# Patient Record
Sex: Female | Born: 1935 | Race: White | Hispanic: No | Smoking: Former smoker
Health system: Southern US, Community
[De-identification: ages and names within clinical notes are randomized; demographics above are authoritative.]

## PROBLEM LIST (undated history)

## (undated) DIAGNOSIS — F32A Depression, unspecified: Secondary | ICD-10-CM

## (undated) DIAGNOSIS — I10 Essential (primary) hypertension: Secondary | ICD-10-CM

## (undated) DIAGNOSIS — E785 Hyperlipidemia, unspecified: Secondary | ICD-10-CM

## (undated) DIAGNOSIS — I2510 Atherosclerotic heart disease of native coronary artery without angina pectoris: Secondary | ICD-10-CM

## (undated) DIAGNOSIS — Z8711 Personal history of peptic ulcer disease: Secondary | ICD-10-CM

## (undated) DIAGNOSIS — M545 Low back pain, unspecified: Secondary | ICD-10-CM

## (undated) DIAGNOSIS — G8929 Other chronic pain: Secondary | ICD-10-CM

## (undated) DIAGNOSIS — J189 Pneumonia, unspecified organism: Secondary | ICD-10-CM

## (undated) DIAGNOSIS — K219 Gastro-esophageal reflux disease without esophagitis: Secondary | ICD-10-CM

## (undated) DIAGNOSIS — G43909 Migraine, unspecified, not intractable, without status migrainosus: Secondary | ICD-10-CM

## (undated) DIAGNOSIS — I219 Acute myocardial infarction, unspecified: Secondary | ICD-10-CM

## (undated) DIAGNOSIS — F419 Anxiety disorder, unspecified: Secondary | ICD-10-CM

## (undated) DIAGNOSIS — I714 Abdominal aortic aneurysm, without rupture, unspecified: Secondary | ICD-10-CM

## (undated) DIAGNOSIS — R0600 Dyspnea, unspecified: Secondary | ICD-10-CM

## (undated) DIAGNOSIS — Z8719 Personal history of other diseases of the digestive system: Secondary | ICD-10-CM

## (undated) DIAGNOSIS — F329 Major depressive disorder, single episode, unspecified: Secondary | ICD-10-CM

## (undated) DIAGNOSIS — Z9861 Coronary angioplasty status: Principal | ICD-10-CM

## (undated) DIAGNOSIS — Z87442 Personal history of urinary calculi: Secondary | ICD-10-CM

## (undated) DIAGNOSIS — E039 Hypothyroidism, unspecified: Secondary | ICD-10-CM

## (undated) DIAGNOSIS — H3530 Unspecified macular degeneration: Secondary | ICD-10-CM

## (undated) DIAGNOSIS — R519 Headache, unspecified: Secondary | ICD-10-CM

## (undated) DIAGNOSIS — Z9889 Other specified postprocedural states: Secondary | ICD-10-CM

## (undated) DIAGNOSIS — R112 Nausea with vomiting, unspecified: Secondary | ICD-10-CM

## (undated) DIAGNOSIS — R51 Headache: Secondary | ICD-10-CM

## (undated) DIAGNOSIS — R0602 Shortness of breath: Secondary | ICD-10-CM

## (undated) DIAGNOSIS — I739 Peripheral vascular disease, unspecified: Secondary | ICD-10-CM

## (undated) HISTORY — DX: Abdominal aortic aneurysm, without rupture: I71.4

## (undated) HISTORY — PX: CATARACT EXTRACTION W/ INTRAOCULAR LENS  IMPLANT, BILATERAL: SHX1307

## (undated) HISTORY — DX: Unspecified macular degeneration: H35.30

## (undated) HISTORY — DX: Atherosclerotic heart disease of native coronary artery without angina pectoris: I25.10

## (undated) HISTORY — DX: Hypothyroidism, unspecified: E03.9

## (undated) HISTORY — PX: APPENDECTOMY: SHX54

## (undated) HISTORY — DX: Coronary angioplasty status: Z98.61

## (undated) HISTORY — PX: CHOLECYSTECTOMY: SHX55

## (undated) HISTORY — DX: Abdominal aortic aneurysm, without rupture, unspecified: I71.40

## (undated) HISTORY — PX: DILATION AND CURETTAGE OF UTERUS: SHX78

## (undated) HISTORY — PX: NM MYOCAR PERF WALL MOTION: HXRAD629

## (undated) HISTORY — DX: Essential (primary) hypertension: I10

## (undated) HISTORY — DX: Peripheral vascular disease, unspecified: I73.9

## (undated) HISTORY — DX: Hyperlipidemia, unspecified: E78.5

---

## 1968-12-18 DIAGNOSIS — J189 Pneumonia, unspecified organism: Secondary | ICD-10-CM

## 1968-12-18 HISTORY — DX: Pneumonia, unspecified organism: J18.9

## 1998-02-27 ENCOUNTER — Inpatient Hospital Stay (HOSPITAL_COMMUNITY): Admission: RE | Admit: 1998-02-27 | Discharge: 1998-02-28 | Payer: Self-pay | Admitting: Urology

## 1999-06-05 ENCOUNTER — Encounter: Payer: Self-pay | Admitting: Family Medicine

## 1999-06-05 ENCOUNTER — Encounter: Admission: RE | Admit: 1999-06-05 | Discharge: 1999-06-05 | Payer: Self-pay | Admitting: Family Medicine

## 1999-06-30 ENCOUNTER — Ambulatory Visit: Admission: RE | Admit: 1999-06-30 | Discharge: 1999-06-30 | Payer: Self-pay | Admitting: Pulmonary Disease

## 1999-09-07 ENCOUNTER — Other Ambulatory Visit: Admission: RE | Admit: 1999-09-07 | Discharge: 1999-09-07 | Payer: Self-pay | Admitting: Family Medicine

## 1999-11-11 ENCOUNTER — Encounter: Payer: Self-pay | Admitting: Family Medicine

## 1999-11-11 ENCOUNTER — Encounter: Admission: RE | Admit: 1999-11-11 | Discharge: 1999-11-11 | Payer: Self-pay | Admitting: Family Medicine

## 2000-03-18 ENCOUNTER — Encounter: Payer: Self-pay | Admitting: Family Medicine

## 2000-03-18 ENCOUNTER — Encounter: Admission: RE | Admit: 2000-03-18 | Discharge: 2000-03-18 | Payer: Self-pay | Admitting: Family Medicine

## 2000-05-04 ENCOUNTER — Ambulatory Visit (HOSPITAL_COMMUNITY): Admission: RE | Admit: 2000-05-04 | Discharge: 2000-05-04 | Payer: Self-pay | Admitting: Gastroenterology

## 2000-05-23 ENCOUNTER — Observation Stay (HOSPITAL_COMMUNITY): Admission: RE | Admit: 2000-05-23 | Discharge: 2000-05-23 | Payer: Self-pay | Admitting: General Surgery

## 2000-05-23 ENCOUNTER — Encounter: Payer: Self-pay | Admitting: General Surgery

## 2000-05-23 ENCOUNTER — Encounter (INDEPENDENT_AMBULATORY_CARE_PROVIDER_SITE_OTHER): Payer: Self-pay

## 2000-05-30 ENCOUNTER — Ambulatory Visit (HOSPITAL_COMMUNITY): Admission: RE | Admit: 2000-05-30 | Discharge: 2000-05-30 | Payer: Self-pay | Admitting: General Surgery

## 2000-05-30 ENCOUNTER — Encounter: Payer: Self-pay | Admitting: General Surgery

## 2000-11-25 ENCOUNTER — Encounter: Payer: Self-pay | Admitting: Family Medicine

## 2000-11-25 ENCOUNTER — Encounter: Admission: RE | Admit: 2000-11-25 | Discharge: 2000-11-25 | Payer: Self-pay | Admitting: Family Medicine

## 2001-04-19 DIAGNOSIS — I219 Acute myocardial infarction, unspecified: Secondary | ICD-10-CM

## 2001-04-19 HISTORY — DX: Acute myocardial infarction, unspecified: I21.9

## 2002-03-06 ENCOUNTER — Other Ambulatory Visit: Admission: RE | Admit: 2002-03-06 | Discharge: 2002-03-06 | Payer: Self-pay | Admitting: Family Medicine

## 2002-04-16 ENCOUNTER — Encounter: Payer: Self-pay | Admitting: Cardiology

## 2002-04-16 ENCOUNTER — Encounter: Admission: RE | Admit: 2002-04-16 | Discharge: 2002-04-16 | Payer: Self-pay | Admitting: Cardiology

## 2002-04-19 HISTORY — PX: CORONARY ANGIOPLASTY WITH STENT PLACEMENT: SHX49

## 2002-04-20 ENCOUNTER — Ambulatory Visit (HOSPITAL_COMMUNITY): Admission: RE | Admit: 2002-04-20 | Discharge: 2002-04-21 | Payer: Self-pay | Admitting: Cardiology

## 2003-01-30 ENCOUNTER — Encounter: Admission: RE | Admit: 2003-01-30 | Discharge: 2003-01-30 | Payer: Self-pay | Admitting: Family Medicine

## 2003-01-30 ENCOUNTER — Encounter: Payer: Self-pay | Admitting: Family Medicine

## 2004-01-22 ENCOUNTER — Ambulatory Visit: Payer: Self-pay | Admitting: Ophthalmology

## 2004-02-26 ENCOUNTER — Ambulatory Visit: Payer: Self-pay | Admitting: Ophthalmology

## 2004-06-24 ENCOUNTER — Other Ambulatory Visit: Admission: RE | Admit: 2004-06-24 | Discharge: 2004-06-24 | Payer: Self-pay | Admitting: Family Medicine

## 2004-06-25 ENCOUNTER — Encounter: Admission: RE | Admit: 2004-06-25 | Discharge: 2004-06-25 | Payer: Self-pay | Admitting: Family Medicine

## 2004-07-18 HISTORY — PX: CORONARY ANGIOPLASTY: SHX604

## 2004-08-03 ENCOUNTER — Encounter: Admission: RE | Admit: 2004-08-03 | Discharge: 2004-08-03 | Payer: Self-pay | Admitting: Cardiology

## 2004-08-06 ENCOUNTER — Ambulatory Visit (HOSPITAL_COMMUNITY): Admission: RE | Admit: 2004-08-06 | Discharge: 2004-08-07 | Payer: Self-pay | Admitting: Cardiology

## 2004-09-04 ENCOUNTER — Emergency Department (HOSPITAL_COMMUNITY): Admission: EM | Admit: 2004-09-04 | Discharge: 2004-09-04 | Payer: Self-pay | Admitting: Emergency Medicine

## 2005-08-09 ENCOUNTER — Ambulatory Visit (HOSPITAL_COMMUNITY): Admission: RE | Admit: 2005-08-09 | Discharge: 2005-08-09 | Payer: Self-pay | Admitting: Gastroenterology

## 2005-08-09 ENCOUNTER — Encounter (INDEPENDENT_AMBULATORY_CARE_PROVIDER_SITE_OTHER): Payer: Self-pay | Admitting: Specialist

## 2005-11-18 ENCOUNTER — Ambulatory Visit (HOSPITAL_COMMUNITY): Admission: RE | Admit: 2005-11-18 | Discharge: 2005-11-18 | Payer: Self-pay | Admitting: *Deleted

## 2005-12-16 ENCOUNTER — Ambulatory Visit: Payer: Self-pay | Admitting: Endocrinology

## 2005-12-18 DIAGNOSIS — I739 Peripheral vascular disease, unspecified: Secondary | ICD-10-CM

## 2005-12-18 HISTORY — DX: Peripheral vascular disease, unspecified: I73.9

## 2005-12-29 ENCOUNTER — Observation Stay (HOSPITAL_COMMUNITY): Admission: RE | Admit: 2005-12-29 | Discharge: 2005-12-30 | Payer: Self-pay | Admitting: Cardiology

## 2005-12-30 HISTORY — PX: ILIAC ARTERY STENT: SHX1786

## 2006-03-03 ENCOUNTER — Encounter: Admission: RE | Admit: 2006-03-03 | Discharge: 2006-03-03 | Payer: Self-pay | Admitting: Family Medicine

## 2006-05-19 ENCOUNTER — Inpatient Hospital Stay (HOSPITAL_COMMUNITY): Admission: EM | Admit: 2006-05-19 | Discharge: 2006-05-20 | Payer: Self-pay | Admitting: Emergency Medicine

## 2007-01-16 ENCOUNTER — Encounter: Payer: Self-pay | Admitting: *Deleted

## 2007-01-16 DIAGNOSIS — E785 Hyperlipidemia, unspecified: Secondary | ICD-10-CM | POA: Insufficient documentation

## 2007-01-16 DIAGNOSIS — N951 Menopausal and female climacteric states: Secondary | ICD-10-CM | POA: Insufficient documentation

## 2007-01-16 DIAGNOSIS — E039 Hypothyroidism, unspecified: Secondary | ICD-10-CM

## 2007-01-16 DIAGNOSIS — I10 Essential (primary) hypertension: Secondary | ICD-10-CM

## 2007-01-16 DIAGNOSIS — I5032 Chronic diastolic (congestive) heart failure: Secondary | ICD-10-CM

## 2007-01-16 DIAGNOSIS — F411 Generalized anxiety disorder: Secondary | ICD-10-CM | POA: Insufficient documentation

## 2008-08-27 ENCOUNTER — Encounter: Payer: Self-pay | Admitting: Pulmonary Disease

## 2008-08-27 ENCOUNTER — Encounter: Admission: RE | Admit: 2008-08-27 | Discharge: 2008-08-27 | Payer: Self-pay | Admitting: Family Medicine

## 2008-09-10 ENCOUNTER — Ambulatory Visit: Payer: Self-pay | Admitting: Pulmonary Disease

## 2008-09-10 DIAGNOSIS — J984 Other disorders of lung: Secondary | ICD-10-CM

## 2008-11-16 ENCOUNTER — Encounter: Admission: RE | Admit: 2008-11-16 | Discharge: 2008-11-16 | Payer: Self-pay | Admitting: Family Medicine

## 2008-11-17 HISTORY — PX: CARDIAC CATHETERIZATION: SHX172

## 2008-11-26 ENCOUNTER — Inpatient Hospital Stay (HOSPITAL_COMMUNITY): Admission: EM | Admit: 2008-11-26 | Discharge: 2008-11-27 | Payer: Self-pay | Admitting: Emergency Medicine

## 2010-07-25 LAB — CARDIAC PANEL(CRET KIN+CKTOT+MB+TROPI)
CK, MB: 1 ng/mL (ref 0.3–4.0)
Relative Index: INVALID (ref 0.0–2.5)
Total CK: 42 U/L (ref 7–177)
Troponin I: 0.01 ng/mL (ref 0.00–0.06)

## 2010-07-25 LAB — DIFFERENTIAL
Basophils Absolute: 0 10*3/uL (ref 0.0–0.1)
Eosinophils Absolute: 0.2 10*3/uL (ref 0.0–0.7)
Lymphocytes Relative: 33 % (ref 12–46)
Lymphs Abs: 2 10*3/uL (ref 0.7–4.0)
Monocytes Absolute: 0.5 10*3/uL (ref 0.1–1.0)

## 2010-07-25 LAB — HEMOGLOBIN A1C
Hgb A1c MFr Bld: 5.9 % (ref 4.6–6.1)
Mean Plasma Glucose: 123 mg/dL

## 2010-07-25 LAB — COMPREHENSIVE METABOLIC PANEL
AST: 30 U/L (ref 0–37)
CO2: 28 mEq/L (ref 19–32)
Chloride: 104 mEq/L (ref 96–112)
Creatinine, Ser: 0.64 mg/dL (ref 0.4–1.2)
GFR calc Af Amer: >60 mL/min (ref >60)
Sodium: 139 mEq/L (ref 135–145)
Total Bilirubin: 0.8 mg/dL (ref 0.3–1.2)

## 2010-07-25 LAB — LIPID PANEL
Cholesterol: 117 mg/dL (ref 0–200)
HDL: 29 mg/dL — ABNORMAL LOW (ref >39)
Total CHOL/HDL Ratio: 4 RATIO
Triglycerides: 198 mg/dL — ABNORMAL HIGH (ref <150)
VLDL: 40 mg/dL (ref 0–40)

## 2010-07-25 LAB — BASIC METABOLIC PANEL
BUN: 11 mg/dL (ref 6–23)
Calcium: 8.9 mg/dL (ref 8.4–10.5)
Creatinine, Ser: 0.63 mg/dL (ref 0.4–1.2)
GFR calc Af Amer: >60 mL/min (ref >60)
GFR calc non Af Amer: >60 mL/min (ref >60)
Glucose, Bld: 98 mg/dL (ref 70–99)
Potassium: 3.6 mEq/L (ref 3.5–5.1)
Sodium: 137 mEq/L (ref 135–145)

## 2010-07-25 LAB — CK TOTAL AND CKMB (NOT AT ARMC)
CK, MB: 1.1 ng/mL (ref 0.3–4.0)
Relative Index: INVALID (ref 0.0–2.5)
Total CK: 41 U/L (ref 7–177)

## 2010-07-25 LAB — CBC
MCV: 101.4 fL — ABNORMAL HIGH (ref 78.0–100.0)
Platelets: 132 10*3/uL — ABNORMAL LOW (ref 150–400)
RDW: 12.9 % (ref 11.5–15.5)

## 2010-07-25 LAB — POCT CARDIAC MARKERS
CKMB, poc: <1 ng/mL — ABNORMAL LOW (ref 1.0–8.0)
Myoglobin, poc: 71 ng/mL (ref 12–200)

## 2010-07-25 LAB — APTT: aPTT: 28 seconds (ref 24–37)

## 2010-07-25 LAB — POCT I-STAT, CHEM 8
BUN: 19 mg/dL (ref 6–23)
Chloride: 107 mEq/L (ref 96–112)
HCT: 38 % (ref 36.0–46.0)
Sodium: 141 mEq/L (ref 135–145)
TCO2: 23 mmol/L (ref 0–100)

## 2010-07-25 LAB — PROTIME-INR: Prothrombin Time: 12.7 seconds (ref 11.6–15.2)

## 2010-07-25 LAB — TSH: TSH: 0.997 u[IU]/mL (ref 0.350–4.500)

## 2010-07-25 LAB — TROPONIN I: Troponin I: 0.01 ng/mL (ref 0.00–0.06)

## 2010-09-01 NOTE — Discharge Summary (Signed)
NAMELUVERNA, [name] NO.:  [id number]   MEDICAL RECORD NO.:  [id number]          PATIENT TYPE:  INP   LOCATION:  3731                         FACILITY:  [facility]   PHYSICIAN:  [name], M.D. DATE OF BIRTH:  04-04-1930   DATE OF ADMISSION:  11/26/2008  DATE OF DISCHARGE:  11/27/2008                               DISCHARGE SUMMARY   DISCHARGE DIAGNOSES:  1. Unstable angina, catheterization this admission showing severe      right coronary artery disease with subtotal occlusion with left-to-      right collaterals, to be treated medically.  2. Known peripheral vascular disease with prior left common iliac      artery percutaneous transluminal angioplasty.  3. Treated dyslipidemia.  4. Treated hypertension.  5. Recent otitis media.   HOSPITAL COURSE:  The patient is a 77 year old female followed by Dr.  Clarene Duke in the past and Dr. Larina Bras.  She was previously followed by Dr.  Smith Mince.  Dr. Clarene Duke had not seen her since 2008.  She had a  catheterization in 2008 showing good LV function.  She had prior stents  to RCA which in 2008 were restenosed.  She apparently was treated  medically.  We had not seen her since.  She had stopped her Plavix,  Ranexa in the interim.  She presented on November 26, 2008, with chest  pain, which awakened her from sleep.  She was admitted to telemetry,  started on IV heparin, nitro paste, and beta-blocker.  Plavix was not  started in case she needed bypass.  Catheterization was done on November 26, 2008, by Dr. Mariah Milling.  She has severely diseased RCA with left-to-  right collaterals.  Circumflex had a 60% mid stenosis.  LAD was without  significant stenosis.  Plan is for continued medical therapy.  Dr.  Clarene Duke feels she can be discharged on November 27, 2008.   DISCHARGE MEDICATIONS:  1. Aspirin 81 mg a day.  2. Zocor 80 mg a day.  3. Lopressor 12.5 mg b.i.d.  4. Mavik 8 mg a day.  5. Imdur 30 mg a day.  6. Nitroglycerin sublingual  p.r.n.  7. Amoxicillin 500 mg t.i.d. as previously taken.   She will follow up with Dr. Clarene Duke as an outpatient.   LABORATORY DATA:  Sodium 137, potassium 3.6, BUN 11, creatinine 0.63.  White count 6.4, hemoglobin 12.2, hematocrit 35.4, platelets 132.  Troponins were negative.  LDL was 48, HDL 29, cholesterol 117.  Liver  functions  were normal.  Troponins were negative as noted.  TSH 0.97.  BNP is less  than 30, hemoglobin A1c is 5.9, INR is 1.0.  EKG; sinus rhythm without  acute changes.   DISPOSITION:  The patient discharged in stable condition and follow up  with Dr. Clarene Duke.      Abelino Derrick, P.A.    ______________________________  [name], M.D.    Lenard Lance  D:  11/27/2008  T:  11/27/2008  Job:  [id number]   cc:   Ace Gins, MD

## 2010-09-01 NOTE — Consult Note (Signed)
NAMEANTARA, BRECHEISEN NO.:  [id number]   MEDICAL RECORD NO.:  [id number]          PATIENT TYPE:  INP   LOCATION:  3731                         FACILITY:  [facility]   PHYSICIAN:  Antonieta Iba, MD   DATE OF BIRTH:  December 11, 1929   DATE OF CONSULTATION:  11/26/2008  DATE OF DISCHARGE:                                 CONSULTATION   Cardiac Catheterization   IDENTIFICATION:  [name] is a pleasant 77 year old woman, who is  seen by Dr. Julieanne Manson and Dr. Ace Gins, who has an underlying  history of coronary artery disease with numerous stents placed to her  RCA, who presents with chest pain.  She was last catheterized in  May 20, 2006, where she was shown to have severe proximal to mid RCA  disease and moderate mid left circumflex disease.  She presents to the  catheterization lab for further evaluation given her chest pain.   PROCEDURE DETAILS:  Risks and benefits of the procedure were discussed  with Ms. Scallan, and consent was obtained.  She was brought to the  cardiac catheterization lab, and prepped and draped in the usual sterile  fashion.  The modified Seldinger technique was used to engage the right  femoral artery.  A 5-French introducer sheath was placed, and 5-French  JL4 and a JR4 were used to engage the left main and the RCA ostium  respectively.  Hand injection contrast with cinematography record the  coronary anatomy.  A 5-French pigtail catheter was used to across the  aortic valve, and an LV gram was recorded.  At the end of the case, an  ACT was obtained.  The sheath was removed, manual pressure held, and  hemostasis obtained.  No complications were reported at the time of this  dictation.   CORONARY ANATOMY:  Left main; left main is a moderate-to-large sized  vessel that bifurcates into the LAD and the left circumflex.  There is  no significant disease noted.   Left anterior descending; the LAD is a moderate-to-large sized  vessel  that extends distally to the apex.  There is 1 moderate-sized diagonal  branch.  There is small mid to distal 20-30% lesion.  Otherwise, no  significant stenosis noted.   Left circumflex; left circumflex is a moderate-to-large sized vessel.  There is 60% proximal to mid disease noted at a tortuous bend.  There is  also 50% lesion noted at the branch point of a circumflex vessel.  Otherwise, no significant disease noted.   Right coronary artery; right coronary artery is diffusely diseased,  subtotally occluded in the proximal and mid RCA stent.  There is TIMI 1  flow distally.  The mid to distal vessel is visible and fills via  collaterals from the left system.   LV gram shows normal systolic function with minimal hypokinesis of the  inferior wall, estimated ejection fraction of 55%.  No significant  mitral regurgitation or aortic stenosis noted.   In summary, severe subtotal occlusion of the RCA with TIMI 1 flow  distally.  There is collateral flow from the  left to right noted.  There  is also moderate disease of the left circumflex, which is unchanged from  previous catheterization in February 2008.  There is minimal LAD disease  in the mid to distal region.  The case was discussed  with Dr. Allyson Sabal, and no PCI was planned on the RCA given the previous  PCI, the small nature of the vessel, and the fact that there are  collaterals from left to right, and she has minimal symptoms.  The case  will be discussed with Dr. Julieanne Manson as well.      Antonieta Iba, MD  Electronically Signed     TJG/MEDQ  D:  11/26/2008  T:  11/27/2008  Job:  [id number]

## 2010-09-04 NOTE — Cardiovascular Report (Signed)
NAMESHERRITA, RIEDERER NO.:  [id number]   MEDICAL RECORD NO.:  [id number]          PATIENT TYPE:  OIB   LOCATION:  2899                         FACILITY:  [facility]   PHYSICIAN:  Darlin Priestly, MD  DATE OF BIRTH:  09/24/1929   DATE OF PROCEDURE:  11/18/2005  DATE OF DISCHARGE:                              CARDIAC CATHETERIZATION   PROCEDURE:  1. Left heart catheterization.  2. Coronary angiography.  3. Left ventriculogram.  4. Abdominal aortogram.   COMPLICATIONS:  None.   INDICATIONS:  Mr. Buresh is a 77 year old female patient of Dr. Caprice Kluver  and Dr. Smith Mince with a history of CAD status post repeat percutaneous  intervention of the proximal and mid RCA with recurrent in-stent restenosis,  hyperlipidemia, hypothyroidism, history of PVD with a recent complaint of  increasing shortness of breath and fatigue.  She is now referred for repeat  catheterization to rule out progression of her CAD.   DESCRIPTION OF PROCEDURE:  After giving informed consent, the patient was  brought to the cardiac cath lab where her right and left groin was shaved,  prepped and draped in the usual sterile fashion.  ECG monitoring  established.  Using modified Seldinger technique, a 6-French arterial sheath  was inserted in the right femoral artery.  A 6-French diagnostic catheter  was used to perform diagnostic angiography.   The left main is a medium to large size vessel with no significant disease.   LAD is a medium size vessel which courses to the apex and give rise to two  diagonal branches.  The LAD is noted to have diffuse 40% irregularities.   The first and second diagonals are small to medium size vessels with no  significant disease.   The left circumflex is a medium size vessel which courses through the AV  groove and gives rise to one obtuse marginal branch.  The AV groove  circumflex is noted to have 60% mid vessel stenosis.   The first OM is a medium size  vessel which bifurcates in the mid segment.  There is 60% narrowing in the upper branch of the bifurcation.   There are left and right collaterals to the distal PDA and posterolateral  branch.   The right coronary artery is a small vessel with overlapping stents noted in  its mid and proximal segment.  There is diffuse in-stent restenosis up to  70% in the proximal stent and diffuse 99% through the mid stent.  The  remainder of the RCA is a small vessel.  As previously stated, there are  left and right collaterals of the distal RCA.   Left ventriculogram has a low normal EF of 50% with mild apical hypokinesis.   Abdominal aortogram reveals two moderate size infrarenal aneurysms present.  The iliacs appear to be moderately calcified with a 50% proximal lesion in  the right iliac and a 70% lesion in the early to mid portion of the left  iliac.   HEMODYNAMICS:  Systemic arterial pressure 136/85, LV __________ pressure  136/80.  LVEDP of 12.   CONCLUSION:  1.  Significant one-vessel coronary artery disease involving a small right      coronary artery with diffuse in-stent restenosis.  2. Low normal ejection fraction with wall motion abnormalities as noted      above.  3. Moderate size infrarenal aneurysm.  4. Significant left iliac disease.  5. Systemic hypertension.      Darlin Priestly, MD  Electronically Signed     RHM/MEDQ  D:  11/18/2005  T:  11/18/2005  Job:  [id number]   cc:   [name], M.D.  [name], M.D.

## 2010-09-04 NOTE — Consult Note (Signed)
[facility]                            ENDOCRINOLOGY CONSULTATION   [name], [name]                        MRN:          [id number]  DATE:12/16/2005                            DOB:          1929-11-10    REFERRING PHYSICIAN:  [name], MD   REASON FOR ADMISSION:  Hypothyroidism.   HISTORY OF PRESENT ILLNESS:  This is a 77 year old woman who states she was  diagnosed with hypothyroidism about 15 years ago.  She was treated with  Synthroid.  She was chronically on 100 mcg a day and in March 2007 was found  to have a suppressed TSH.  Since then, she has decreased the medication to  where she takes either 100 mcg 6 times a week or 88 mcg a day.  Symptomatically, she states she feels very tired.  She has associated  weakness of all four extremities.  She feels her symptoms are causing her to  have some associated depression.  She also has anxiety, for which she takes  Xanax, about 2 tablets a day, and she describes her diet and exercise as  both good.   PAST MEDICAL HISTORY:  1. Hypertension.  2. Dyslipidemia.  3. Menopause.  4. CAD.  5. Anxiety.   SOCIAL HISTORY:  She is widowed, she is retired.  Her daughter is with her.   FAMILY HISTORY:  Her sister has hyperthyroidism.   REVIEW OF SYSTEMS:  Denies the following:  Shortness of breath and change in  her weight.   PHYSICAL EXAMINATION:  VITAL SIGNS:  Blood pressure 138/75, heart rate 78,  temperature is 97.3 , the weight is 150.  GENERAL:  In no distress.  SKIN:  Not diaphoretic.  HEENT:  No proptosis, no periorbital swelling.  NECK:  No goiter.  CHEST:  Clear to auscultation, no respiratory distress.  CARDIOVASCULAR:  No JVD, no edema.  Regular rate and rhythm, no murmur.  MUSCULOSKELETAL:  Muscle bulk and strength in all fours appears to be  normal.  Gait is observed in the office to be normal.  NEUROLOGIC:  Alert and oriented, does not appear anxious nor depressed, and  there is no tremor.   LABORATORY STUDIES:  Forwarded by Dr. Smith Mince.  On June 24, 2004, TSH  1.76.  On December 25, 2004, TSH 0.417.  On July 15, 2005, TSH 0.16.  On  October 11, 2005, (after a reduction in her medication) TSH was 3.46.   IMPRESSION:  1. History of hypothyroidism, which is usually autoimmune.  She is      currently euthyroid on replacement.  2. Fatigue, which in view of her recent normal TSH should be considered to      be not thyroid related.  3. Depression and anxiety symptoms, also not thyroid related.   PLAN:  1. Same amount of Synthroid.  I have told her she can take either 88 mcg 7      days a week, or 100 mcg 6 days a week.  2. Return here p.r.n.  3. I have advised her to minimize the Xanax.  [name], MD   SAE/MedQ  DD:  12/17/2005  DT:  12/18/2005  Job #:  [id number]   cc:   [name], MD  [name], MD

## 2010-09-04 NOTE — Op Note (Signed)
[facility]  Patient:    [name], [name]                        MRN: [id number] Proc. Date: 05/23/00 Adm. Date:  [id number] Attending:  Arlis Porta CC:         [name], M.D.  Anselmo Rod, M.D.   Operative Report  PREOPERATIVE DIAGNOSIS:  Symptomatic cholelithiasis.  POSTOPERATIVE DIAGNOSES: 1. Chronic calculus cholecystitis. 2. Incarcerated ventral incisional hernia.  OPERATION: 1. Laparoscopic cholecystectomy with intraoperative cholangiogram. 2. Repair of incarcerated ventral incisional hernia.  SURGEON:  Adolph Pollack, M.D.  ASSISTANT:  Anselm Pancoast. Zachery Dakins, M.D.  ANESTHESIA:  General.  FINDINGS:  About 1.5 cm inferior to the umbilicus, there was an incarcerated ventral incisional hernia that as incarcerated in omental fat that was able to be reduced.  There were chronic inflammatory changes grossly that were noted when inspecting the gallbladder.  INDICATIONS:  Ms. Reeg is a 77 year old female who has been having some biliary colic.  She had a CT scan to evaluate her abdominal pain, and this was essentially unremarkable except for cholelithiasis.  She now presents for elective cholecystectomy.  TECHNIQUE:  The patient was placed supine on the operating table, and general anesthesia was administered.  Her abdomen was sterilely prepped and draped. Local anesthetic was infiltrated in the infraumbilical region, and incision made in the infraumbilical region.  When I was dissecting the subcutaneous tissue, I noted some omentum and incarcerated hernia inferior to the umbilicus.  It appeared to be secondary to a previous lower abdominal incision she had had.  I was able to trim the sac and reduce incarcerated omentum back into the abdominal cavity.  I opened up this area a little bit more and gained access to the peritoneal cavity under direct vision through this.  I then placed a Hasson trocar into the  peritoneal cavity and creating a pneumoperitoneum by insufflation of CO2 gas.  Next, she was placed in the appropriate position, and an epigastric incision was made and 11 mm trocar placed through the epigastric incision into the peritoneal cavity.  Two 5 mm incisions were made in the right mid and mid lateral abdomen, and two 5 mm trocars were placed through these.  The gallbladder was inspected, and was pale in color consistent with chronic inflammatory changes.  The fundus was grasped and retracted toward the right shoulder.  The infundibulum was grasped and retracted laterally.  Using blunt dissection as well as some cautery, I completely mobilized the infundibulum and isolated the cystic duct and anterior and posterior branch of the cystic artery.  I clipped the anterior and posterior branches of the cystic artery and divided them.  I then clipped the gallbladder neck/cystic duct junction and made an incision in the cystic duct.  I passed a cholangiocatheter through the anterior abdominal wall and performed a cholangiogram.  Under real time fluoroscopy, 50% strength contrast was injected into the cystic duct.  It promptly entered the common bile duct which drained promptly to the duodenum.  There were no obvious filling defects present.  Final reading is pending radiologists interpretation.  Next, I removed the cystic duct, clipped the cystic artery three times proximally, and divided it sharply.  I then dissected the gallbladder free from the liver bed using the cautery.  There was a very small hole placed in the gallbladder with minimal bile spillage.  The gallbladder was placed in an Endopouch bag and removed  through the subumbilical port.  I then inspected the liver bed, irrigated it, and bleeding points were controlled with cautery.  I evacuated the irrigation fluid.  I then removed all the trocars and released the pneumoperitoneum.  I then addressed the ventral incisional  hernia.  I closed this primarily with interrupted 0 Surgilon sutures.  I then irrigated out this wound.  The skin incision were then all closed with 4-0 Monocryl subcuticular stitches followed by Steri-Strips and sterile dressings.  She tolerated the procedure well without any apparent complications and was taken to the recovery room in satisfactory condition. DD:  05/23/00 TD:  05/23/00 Job: [id number]

## 2010-09-04 NOTE — Op Note (Signed)
[name], [name] NO.:  [id number]   MEDICAL RECORD NO.:  [id number]          PATIENT TYPE:  AMB   LOCATION:  ENDO                         FACILITY:  [facility]   PHYSICIAN:  Anselmo Rod, M.D.  DATE OF BIRTH:  10-01-1929   DATE OF PROCEDURE:  08/09/2005  DATE OF DISCHARGE:                                 OPERATIVE REPORT   PROCEDURE PERFORMED:  Colonoscopy with multiple cold biopsies and snare  polypectomy x1.   ENDOSCOPIST:  Anselmo Rod, M.D.   INSTRUMENT USED:  Olympus videoscopic colonoscope.   INDICATIONS FOR PROCEDURE:  A 77 year old white female with a history of  changing bowel habits, fecal incontinence, and abdominal pain, undergoing a  colonoscopy to rule out colonic polyps, masses, etc.   PRE-PROCEDURE PREPARATION:  Informed consent was procured from the patient.  The patient fasted for four hours prior to the procedure and prepped with  OsmoPrep pills the night of and the morning of the procedure.  Risks and  benefits of the procedure, including a 10% missed rate of cancer and polyps  were discussed with her as well.   PRE-PROCEDURE PHYSICAL EXAMINATION:  VITAL SIGNS:  Stable vital signs.  NECK:  Supple.  CHEST:  Clear to auscultation.  HEART:  S1 and S2.  Regular.  ABDOMEN:  Soft with normal bowel sounds.   DESCRIPTION OF PROCEDURE:  The patient was placed in the left lateral  decubitus position and sedated with 100 mcg of fentanyl and 10 mg of Versed  in slow, incremental doses.  Once the patient was adequately sedated and  maintained on low flow oxygen with continuous cardiac monitoring, the  Olympus videoscopic colonoscope was advanced from the rectum to the cecum.  The appendicle orifices and ileocecal valve were visualized and  photographed.  There was evidence of severe pan diverticular disease with  more prominent changes in the left colon.  There was a large amount of stool  in the diverticula.  Multiple washings were done.   Small lesions could be  missed. A small polyp was biopsied from the proximal right colon, another  one snared from the proximal right colon by a hot snare. Random colon  biopsies were done to rule out microscopic versus collagenous colitis.  T\The terminal appeared healthy without lesions.  The patient tolerated  the procedure well without complications.  Retroflexion in the rectum  revealed no abnormalities.   IMPRESSION:  1.  Pan diverticulosis with more prominent changes in the sigmoid colon.  2.  A large amount of residual stool in the colon.  Small lesions could be      missed.  3.  One polyp snared from the proximal right colon and one biopsied from the      same area.  4.  Random colon biopsies done to rule out colitis.   RECOMMENDATIONS:  1.  Await pathology results.  2.  Avoid all nonsteroidals, including aspirin, for now.  3.  Continue a high-fiber diet with liberal fluid intake.  4.  Outpatient followup within the next two weeks for further  recommendations.      Anselmo Rod, M.D.  Electronically Signed     JNM/MEDQ  D:  08/10/2005  T:  08/10/2005  Job:  [id number]   cc:   [name], M.D.  Fax: [phone]

## 2010-09-04 NOTE — Procedures (Signed)
. [facility]  Patient:    [name], [name]                       MRN: [id number] Proc. Date: 05/04/00 Attending:  Anselmo Rod, M.D. CC:         [name], M.D.                           Procedure Report  DATE OF BIRTH:  [birth year].  REFERRING PHYSICIAN:  [name], M.D.  PROCEDURE PERFORMED:  Screening colonoscopy in a 77 year old white female with right lower quadrant pain, rule out colonic polyps, masses, hemorrhoids, etc.  ENDOSCOPIST:  Anselmo Rod, M.D.  INSTRUMENT USED:  Olympus video colonoscope.  PREPROCEDURE PREPARATION:  Informed consent was procured from the patient. The patient was fasted for eight hours prior to the procedure and prepped with a bottle of magnesium citrate and a gallon of NuLytely the night prior to the procedure.  PREPROCEDURE PHYSICAL:  The patient had stable vital signs.  Neck supple. Chest clear to auscultation.  S1, S2 regular.  Abdomen soft with normal abdominal bowel sounds.  DESCRIPTION OF PROCEDURE:  The patient was placed in the left lateral decubitus position and sedated with 50 mg of Demerol and 5 mg of Versed intravenously.  Once the patient was adequately sedated and maintained on low-flow oxygen and continuous cardiac monitoring, the Olympus video colonoscope was advanced from the rectum to the cecum with slight difficulty secondary to a very tortuous colon.  The patient had evidence of pandiverticular disease with more prominent changes in the left colon.  No masses, polyps, erosions or ulcerations or diverticula were seen.  IMPRESSION:  Pandiverticular disease with more prominent changes in the left colon.  RECOMMENDATIONS: 1. The patient is advised to increase the fluid and fiber in her diet and    repeat colorectal cancer screening is recommended in the next 10 years or    earlier if she were to develop any abnormal symptoms in the interim. 2. Outpatient  follow-up was advised in the next four weeks.  RECOMMENDATIONS:DD:  05/04/00 TD:  05/04/00 Job: [id number]

## 2010-09-04 NOTE — Cardiovascular Report (Signed)
NAMEBRADIE, [name] NO.:  [id number]   MEDICAL RECORD NO.:  [id number]          PATIENT TYPE:  INP   LOCATION:  2011                         FACILITY:  [facility]   PHYSICIAN:  Cristy Hilts. Jacinto Halim, MD       DATE OF BIRTH:  11-13-1929   DATE OF PROCEDURE:  12/29/2005  DATE OF DISCHARGE:  12/30/2005                              CARDIAC CATHETERIZATION   PROCEDURE PERFORMED:  1. Abdominal aortogram with bifemoral runoff.  2. PTA and direct stenting of the left common iliac artery.   INDICATIONS:  [name] is an 77 year old female who had undergone  cardiac catheterization by Dr. Lenise Herald on November 18, 2005.  She was  found to have a 70-80% stenosis of the left common iliac artery.  She has  been complaining of left hip claudication for several months.  Given this,  she was brought to the catheterization lab for evaluation of peripheral  anatomy; and for percutaneous revascularization.   ANGIOGRAPHIC DATA:  Abdominal aortogram with bifrontal runoff.  Abdominal  aortogram with bifrontal runoff revealed a moderate sized distal abdominal  aortic aneurysm at the bifurcation.  There is a moderate amount of  calcification.  The aortoiliac bifurcation was patent with mild lumina  irregularity.  There was a focal left common iliac 80% stenosis.  Otherwise  the left external iliac artery had mild luminal irregularity and left SFA in  the thigh region had also mild luminal irregularity.  There was a 3-vessel  runoff noted in the left lower extremity.  The right iliac artery was patent  with mild diffuse calcification.  There was mild luminal irregularity of the  right SFA, and there was 3-vessel runoff noted in the right leg.   The leg runoff at the left was slower than the right.   INTERVENTIONAL DATA:  Successful PTA and direct stenting of the left common  iliac artery with an 8.0 x 18 mm balloon expandable, OmniLink stent deployed  at 8 atmospheres pressure for 2  minute.  Post stent deployment angiography  revealed excellent results with 0% residual stenosis.   RECOMMENDATIONS:  The patient will be continued on aspirin and Plavix for at  least a period of 2 weeks, and possibly for 4-6 weeks.  She will need  continued risk factor modification.  She will need to have followup on her  abdominal aortic aneurysm.  A total of 137 mL of contrast was utilized for  diagnostic and interventioinal procedure.   TECHNIQUES OF PROCEDURE:  In the usual sterile precautions, using 5-French  left femoral arterial access, a 5 French pigtail catheter was advanced into  the abdominal aorta; and abdominal aortogram with bifemoral runoff was  performed. Then exchanging the 5-French to a 7-French sheath and using a  0.025-inch Wholey wire, we measured the size of the lesion and decided to  proceed with stent implantation with an 8.0 x 18 mm OmniLink stent.  Given  heparin prior to coagulation, the stent was advanced at the lesion site and  the stent was deployed.  Post stent deployment angiography was performed.  Excellent results were noted.  This stent was deployed at 8 atmospheres of  pressure for 60 seconds.  The pigtail catheter was then introduced back into  the abdominal aorta and an abdominal  aortogram was performed to evaluate both the inflow and outflow of the  stent.  Again, excellent results were noted.  Then the catheter was pulled  out of the body without any significant pressure differential between the  inflow and outflow of the stent.  Overall, the patient tolerated the  procedure well.  No immediate complications noted.      Cristy Hilts. Jacinto Halim, MD  Electronically Signed     JRG/MEDQ  D:  12/29/2005  T:  12/30/2005  Job:  [id number]   cc:   Thereasa Solo. [name], M.D.  [name], M.D.

## 2010-09-04 NOTE — Cardiovascular Report (Signed)
NAME:  [name], [name]                           ACCOUNT NO.:  [id number]   MEDICAL RECORD NO.:  [id number]                   PATIENT TYPE:  OIB   LOCATION:  6532                                 FACILITY:  [facility]   PHYSICIAN:  [name], M.D.              DATE OF BIRTH:  1930/03/24   DATE OF PROCEDURE:  04/20/2002  DATE OF DISCHARGE:                              CARDIAC CATHETERIZATION   PROCEDURE:  Left heart catheterization and complex intervention.   INDICATIONS FOR TEST:  The patient is a 77 year old female who two weeks ago  had an episode of chest pressure, nausea, diaphoresis, and vomiting.  Since  that time, she has had recurrent problems with nausea and fatigue. She has  known left lower leg claudication that is mild.  She is brought in for  outpatient cardiac catheterization.  On physical examination she has an  abdominal mass and an abdominal bruit.   DESCRIPTION OF PROCEDURE:  The patient was prepped and draped in the usual  sterile fashion exposing the right groin. Following local anesthetic with 1%  Xylocaine, the Seldinger technique was employed and a 5 [place] introducer  sheath was placed into the right femoral artery. Left and right coronary  arteriography, ventriculography in the RAO projection and complex  percutaneous intervention was performed.   RESULTS:  1. Hemodynamic monitoring:  Central aortic pressure was 164/80, left     ventricular pressure was 163/8.  No aortic valve gradient noted at time     of pullback.  2. Ventriculography:  Ventriculography in the RAO projection revealed normal     left ventricular systolic function.  There was left ventricular     hypertrophy.  Rare ectopy was noted.  End-diastolic pressure was 15.   DISTAL AORTOGRAM:  The distal aortogram using 20 cc of contrast at 15  cc/sec. revealed a small abdominal aortic aneurysm at the bifurcation of the  aorta.  There was less than 50% stenosis of the left renal artery. There  was  mild iliac disease bilaterally of 40-50%.   CORONARY ARTERIOGRAPHY:  Calcification was seen in the distribution of the  LAD on fluoroscopy.  1. Left main:  Normal.  2. LAD:  The LAD crosses the apex of the heart and was a small vessel and     gave rise to two small diagonal vessels.  This entire system had minimal     irregularities.  3. Circumflex:  The circumflex is the largest of all three vessels.  It had     a 50% area of proximal narrowing.  There was a large OM-1 that     bifurcated.  The first bifurcation had an area of 60% narrowing.  The     second portion of the bifurcation had mild irregularities.  4. Right coronary artery:  The right coronary artery had a long 95% area of     stenosis with dense  calcification. The distal right, the PDA and the     posterolateral branches were free of disease.  There was no significant     change in the diameter of the vessel with intracoronary nitroglycerin.     It was about a 2 mm vessel.  The 5 French catheter damped when it engaged     the ostium.   In view of the stenosis in the right coronary artery, arrangements were made  for intervention. A 6 French sheath was exchanged for the previously placed  5 [place] sheath.  A JR4, 6 [place] guide catheter with side holes and a short  luge wire was used.  The wire was easily passed into the distal right  coronary artery.  A 2.0 x 18 Pixel stent was made ready but would not cross  for primary stenting.   A 2.5 x 15 CrossSail balloon was made ready.  The area was ballooned on two  different occasions, 8 x 46 and 8 x 47.  There was still significant  narrowing and attempts at crossing again with a 2.18 Pixel stent was  unsuccessful.   The CrossSail balloon was inflated for the second time into the area of the  obstruction.  Three inflations, 11 x 56, 10 x 58 and 9 x 43 were performed.  The proximal portion of the RCA now appeared to be slightly hazy extending  back towards but not into  the ostium.  Because of the expansion of the area  of narrowing, a 2.0 x 23 Pixel stent was made ready. It crossed easily.  It  was deployed at 12 x 64 with the final inflation being 12 x 60.   The 2.0 x 18 stent was then placed overlapping the 2.0 x 23 stent and  extending up to the ostium of the right coronary artery.  It was deployed at  12 x 63 with the final inflation being 12 x 60.   The final shows the proximal portion of the vessel to be normal to slightly  hyperexpanded.  There was always TIMI-3 flow pre and post.  This was a C-  type lesion.  There was no evidence of any dissection or thrombosis.  Angiomax was used during the procedure.  The patient was given 300 mg of  Plavix.  She was started on beta blockers and Lipitor.  She should be ready  for discharge in the morning. It should be pointed out that with the  inflations of the balloon and stents, it duplicated the pain she had as an  outpatient.                                                  [name], M.D.    ABL/MEDQ  D:  04/20/2002  T:  04/21/2002  Job:  [id number]   cc:   [name], M.D.  [address], [address]  [place]  [place] [address]  Fax: [phone]

## 2010-09-04 NOTE — Discharge Summary (Signed)
[name], [name] NO.:  [id number]   MEDICAL RECORD NO.:  [id number]          PATIENT TYPE:  INP   LOCATION:  2023                         FACILITY:  [facility]   PHYSICIAN:  Cristy Hilts. Jacinto Halim, MD       DATE OF BIRTH:  August 05, 1929   DATE OF ADMISSION:  05/19/2006  DATE OF DISCHARGE:  05/20/2006                               DISCHARGE SUMMARY   PRIMARY CARDIOLOGIST:  Dr. Caprice Kluver.   PRIMARY CARE PHYSICIAN:  Dr. Sonia Baller.   FINAL DIAGNOSES:  1. Chest pain consistent with unstable angina pectoris.  2. Known coronary artery disease.  3. Hypothyroidism.  4. Peripheral vascular disease status post PTA (percutaneous      transluminal angioplasty)  and stenting of the left common iliac      artery.  5. Dyslipidemia.  6. Abdominal aortic aneurysm.   PROCEDURE:  Left heart catheterization and coronary arteriogram were  performed on 05/20/2006 by Dr. Jeanella Cara revealing two-vessel coronary  artery disease with stable RCA stenosis.  There were collaterals from  the left.  In the circumflex artery, there was moderate to high grade  stenosis with increased progression.  Ejection fraction was 60% to 65%.  In the right coronary artery, there was severe, diffuse in-stent  restenosis at 80% to 90%.  The circumflex lesion was 70% to 80%.   BRIEF HISTORY AND HOSPITAL COURSE:  [name] was admitted on  05/19/2006 after presenting to our office with complaints of increased  shortness of breath and chest tightness over the last two weeks.  This  occurs on a daily basis with associated chest tightness.  She has  experience PND as well as some light-headedness.  On arrival to the  emergency department, initial enzymes were negative for acute myocardial  infarction.  Vital signs:  Blood pressure was slightly elevated at  170/76.  Chest x-ray revealed no infiltrate or congestive heart failure,  and EKG revealed no acute changes.  Her chest discomfort was relieved  with O2.  She was  started on low-dose beta blocker and continued on her  home medications.  IV heparin was also initiated, as well IV  nitroglycerin.  She was scheduled for left heart catheterization on  05/20/2006 with Dr. Jacinto Halim with results as above.  She tolerated the  procedure without difficulty and was transferred back to [address] in  stable condition.  On discharge, she is ambulating in the hallway  without difficulty and has no recurrent chest pain.   It was recommended by Dr. Jacinto Halim that she be scheduled for an outpatient  Myoview to evaluate for any ischemia, and so will proceed with PCI of  the circumflex.  She does have diastolic dysfunction as well and her  Mavik was increased to 8 mg daily, and now we will also add metoprolol  25 mg b.i.d. to her regimen.  I have scheduled her for an adenosine  Myoview as an outpatient for the next one to two weeks and scheduled her  for followup with Dr. Clarene Duke once that procedure is complete and results  are available.  She is to contact our office with any problems or  questions.  Otherwise, we will see her back in her Persantine stress  test.   DISCHARGE MEDICATIONS:  1. Metoprolol 25 mg b.i.d.  2. Mavik 8 mg daily.  3. Hydrochlorothiazide 25 mg daily.  She is to restart this on      05/22/2006.  4. Aspirin 81 mg daily.  5. Vytorin 10/40 mg daily.  6. Levothyroxine 100 mcg daily.  7. Prempro 0.625/5 mg daily.  8. Ranexa 500 mg b.i.d.  9. Celexa 20 mg daily.  10.Plavix 75 mg daily.  11.Sublingual nitroglycerin as needed for chest discomfort.   She is to increase her activities slowly.  She is to avoid heavy  lifting, strenuous physical activity for the next week, and no driving  for the next 48 hours.  She may shower or bathe tomorrow.  I have  recommended a heart healthy, low sodium diet.  In regards to her wound  care she is to contact our office immediately with any redness,  swelling, or drainage from the cath site or with the onset of any   temperature, fever, or chills.     ______________________________  Charmian Muff, NP      Cristy Hilts Jacinto Halim, MD  Electronically Signed    LS/MEDQ  D:  05/20/2006  T:  05/21/2006  Job:  [id number]   cc:   [name], M.D.  [facility]

## 2010-09-04 NOTE — Cardiovascular Report (Signed)
[name], [name] NO.:  [id number]   MEDICAL RECORD NO.:  [id number]          PATIENT TYPE:  OIB   LOCATION:  2899                         FACILITY:  [facility]   PHYSICIAN:  [name], M.D. DATE OF BIRTH:  09/06/1929   DATE OF PROCEDURE:  08/06/2004  DATE OF DISCHARGE:                              CARDIAC CATHETERIZATION   INDICATIONS FOR TEST:  This 77 year old female had overlapping stents placed  to her proximal RCA January of 2004. She presented with an episode of  somewhat atypical chest pain that was exertional in nature.  A nuclear study  was performed that showed inferior lateral ischemia and because of this, she  is brought in for outpatient cardiac catheterization.   After obtaining informed consent. the patient was prepped and draped in the  usual sterile fashion exposing the right groin.  Following local anesthetic  with 1% Xylocaine, the Seldinger technique was employed and a 5-French  introducer sheath was placed into the right femoral artery.  The right and  left coronary arteriography, ventriculography in the RAO projection and a  distal aortogram was performed. Because of high-grade stenosis in her RCA,  difficult PTCA to in-stent restenosis was also performed.   DIAGNOSTIC CATHETERS:  5-French Judkins configurations.   TOTAL CONTRAST USED:  245 mL.   COMPLICATIONS:  None.   The standard a 0.035 guidewire with loop in the distal aorta and because of  this, a right coronary catheter and a Glidewire was used to navigate be a  distal aorta and after that, exchange wire technique was performed for all  catheter changes.   RESULTS:  1.  Hemodynamic monitoring: Central aortic pressure was 130/63. Left      ventricular pressure was 123/8. No significant gradient was noted the      time of pullback.  2.  Ventriculography.  Ventriculography in the RAO projection revealed the      left ventricle to be hyperdynamic. Ejection fraction was  greater than      70% but this was in the place of ventricular ectopy.   Distal aortogram:  There was no evidence of renal artery stenosis. There was  a small fusiform abdominal aortic aneurysm in the infrarenal area and at the  bifurcation with irregularities throughout the entire aorta below the renal  arteries. The iliacs were poorly visualized but there was evidence of  bilateral iliac irregularities.   Coronary arteriography:  1.  Left main normal.  2.  LAD:  The LAD was calcified as was the left main. It extended down to      the apex of the heart and had mild irregularities in the proximal and      mid section. There were three small diagonal branches.  3.  Circumflex:  The circumflex gave rise to a trifurcating OM system. There      was mid and proximal less than 60% areas of narrowing in the circumflex      itself and this is unchanged from January of 2004 cardiac      catheterization.  4.  Right coronary artery:  The right coronary was a small diameter vessel      with stents noted on fluoroscopy of the proximal third of the vessel but      they did not extend into the ostium itself. There was, however,      calcification in the ostium. The PDA and posterior lateral branch in the      distal RCA were small and free of significant disease. Slightly distal      to the stents was a tapering area of about 30% narrowing. In the middle      of the stents was an 70-80%  6 mm long area of stenosis. The ostium was      heavily calcified and approximately 40% narrowing.   Because of the high-grade stenosis in the stents within the right coronary  artery, arrangements were made for intervention. The patient was given a  total of 4600 units of intravenous heparin double, bolus Integrilin and the  Integrilin drip will be continued 12 hours post procedure.   A 6-French [facility] guide catheter with side holes was used. This was the best of  the catheters, however, it did not give adequate  backup. A Luge wire was  initially used to go down the right coronary artery but continued to go out  side branches. A Whisper wire was used in a buddy wire type situation but  it too went down side holes. After reforming the tip of the short Luge wire.  It was finally passed down into the distal right coronary artery.   The original plan was to use a cutting balloon, an angioplasty within the  stents and the ostium of the RCA. Unfortunately a 2.0 x 6 cutting balloon  would not even cross into the ostium. A 2.0 x 15 Quantum balloon also would  not cross through the ostium. A 1.5 x 15 Maverick was finally placed into  the area of stenosis within the stents.  Three inflations 6 x48 14 x44 and  15 x51 were performed.   Following this, a 2.5 of 15 Maverick balloon was used in the area of the in-  stent restenosis, 11 atmospheres x59 seconds and 12 atmospheres x56 seconds  were performed.   The area that had been 80% narrowed preintervention now was less than 20%  narrowed with no evidence of any dissection, thrombus or distal  embolization. The ostium was not addressed since I could not pass a cutting  balloon into it.  I was uncomfortable that a standard angioplasty may result  in dissection of this heavily calcified area.   The patient should be ready for discharge in the morning but because of the  use of 245 mL of contrast, I will hydrate her overnight and check her BMP,  hemoglobin and creatinine in the morning.      ABL/MEDQ  D:  08/06/2004  T:  08/06/2004  Job:  [id number]   cc:   [name], M.D.  [address]  [place], [place]  [place] [address]  Fax: [phone]   Cath Lab

## 2010-09-04 NOTE — Discharge Summary (Signed)
[name] NO.:  [id number]   MEDICAL RECORD NO.:  [id number]          PATIENT TYPE:  OIB   LOCATION:  6532                         FACILITY:  [facility]   PHYSICIAN:  [name], M.D. DATE OF BIRTH:  10-05-1929   DATE OF ADMISSION:  08/06/2004  DATE OF DISCHARGE:                                 DISCHARGE SUMMARY   DISCHARGE DIAGNOSES:  1.  Coronary artery disease status post intervention during this admission      to the RCA.  2.  Hypertension.  3.  Hyperlipidemia, not on any statin drugs currently.  4.  Hypothyroidism.  5.  Moderate peripheral vascular disease per the study in January 2004.   DISCHARGE MEDICATIONS:  1.  Aspirin 81 mg daily.  2.  Plavix 75 mg daily.  3.  Mobic 4 mg daily.  4.  Synthroid 0.122 mg daily.  5.  Prempro one pill daily.  6.  Hydrochlorothiazide 25 mg daily.  7.  Zoloft 50 mg daily.  8.  Spiriva inhaler 18 mg daily.  9.  Spiriva inhaler 18 mcg daily.  10. Toprol XL 25 mg daily.   HOSPITAL COURSE:  This is a 77 year old female who is a patient of Dr.  Smith Mince and Dr. Clarene Duke who was evaluated in our office for complaints of  chest pain.  She underwent outpatient catheterization that showed end stent  restenosis of the RCA, and the patient was admitted to [facility]  for intervention.   PROCEDURES:  Percutaneous transluminal coronary angioplasty was performed to  the RCA for end stent restenosis was performed by Dr. Clarene Duke on August 06, 2004.  As a result of the intervention, the lesion was reduced from 80% to  less than 20%.  The patient tolerated the procedure well, was given a bolus  in the form of Integrilin and transferred to the post catheterization unit  in stable condition.   The next morning, the patient was assessed by Dr. Clarene Duke, found to be in  stable condition with minimal bruising of the right groin area.   She was considered stable for discharge home, and also, Dr. Clarene Duke made a  note  about the patient not being on any statin medications currently for  hyperlipidemia.  He is planning to discuss this issue with the patient and  her daughter during the follow up office visit post catheterization.   DISCHARGE INSTRUCTIONS:  1.  Activity:  No driving or heavy lifting greater than 5 pounds.  No      strenuous activities for three days post catheterization.  2.  Diet:  Low-fat, low-cholesterol diet.  3.  Wound Care:  The patient was allowed to shower, instructed not to rub      current puncture site, pat it dry.   FOLLOWUP:  Schedule with Dr. Clarene Duke on May 20 at 10:30 a.m. in our office.      MK/MEDQ  D:  08/07/2004  T:  08/07/2004  Job:  [id number]   cc:   [facility]

## 2010-09-04 NOTE — Cardiovascular Report (Signed)
NAMEDERISHA, FUNDERBURKE NO.:  [id number]   MEDICAL RECORD NO.:  [id number]          PATIENT TYPE:  INP   LOCATION:  2023                         FACILITY:  [facility]   PHYSICIAN:  Cristy Hilts. Jacinto Halim, MD       DATE OF BIRTH:  Dec 14, 1929   DATE OF PROCEDURE:  05/20/2006  DATE OF DISCHARGE:  05/20/2006                            CARDIAC CATHETERIZATION   ATTENDING CARDIOLOGIST:  Dr. Julieanne Manson   REFERRING PHYSICIAN:  [name], M.D.   PROCEDURE PERFORMED:  1. Left ventriculography.  2. Selective right and left coronary arteriography.  3. Abdominal aortogram.  4. Right femoral angiography and closure of the right femoral arterial      access with StarClose.   INDICATION:  Ms. Olivarez is a fairly active 77 year old female who has  got chronic fatigue syndrome, who was admitted to the hospital with  chest pain.  This chest pain was suggestive of unstable angina.  After  ruling her out for myocardial infarction, she was brought to the cardiac  catheterization lab to evaluate her coronary anatomy.  An abdominal  aortogram was performed because of known abdominal aortic aneurysm.   HEMODYNAMIC DATA:  The left ventricular pressures were 145/-10 with end-  diastolic pressure of 5 mm Hg.  The aortic pressures were 139/64 with a  mean of 94 mmHg.  There was no pressure gradient across the aortic  valve.   ANGIOGRAPHIC DATA:  Left ventricle:  The left ventricular systolic  function was normal with an ejection fraction of 60% to 65%.  There was  no significant mitral regurgitation.   Right coronary artery:  The right coronary artery is a dominant vessel.  However, it is severely diffusely diseased.  The long stents that were  placed in the proximal and mid segment of the right coronary artery was  severely restenosed and this was unchanged from prior cardiac  catheterization in August of 2007.   Left main:  The left main is a large caliber vessel, very smooth and  round.   Circumflex:  The circumflex is a large caliber vessel.  In the proximal  segment of the circumflex, where there is an AV groove branch, the  circumflex is tortuous and there is a high grade 70% to 80% stenosis.  There is mild poststenotic ectasia.  This stenosis does appear to be  slightly worsened compared to the prior cardiac catheterization, but  this is only marginal.  Otherwise, the circumflex had mild luminal  irregularity.   LAD:  The LAD is a large caliber vessel.  It has got mild luminal  irregularity.  It gives origin to several small diagonals.   Abdominal aortogram:  Abdominal aortogram revealed presence of 2 renal  arteries following on either side that are widely patent.  The abdominal  aorta has mild ectasia in the mid segment and distal abdominal aorta at  the aortoiliac bifurcation.  There is moderate sized aortic aneurysm,  which is not significantly changed from prior cardiac catheterization.  The previously placed left common iliac stent is widely patent.  The  inflow of  the stent has a 20% to 30% stenosis and this was left alone  previously, secondary to the proximity to the aneurysmal sac.  Overall,  this appears to be widely patent.   IMPRESSIONS:  1. Chest pain suggestive of angina pectoris secondary to small vessel      disease.  There are excellent collaterals that are more well      visualized this time from the left system to the right coronary      artery.  2. Suspect shortness of breath secondary to diastolic dysfunction.   RECOMMENDATION:  Medical therapy for angina pectoris is indicated.  A  stress Myoview could be considered and if it shows lateral wall ischemia  we could potentially consider her for angioplasty to the circumflex  coronary artery.   Her beta-blockers will be increased to 25 mg of metoprolol twice a day.  In the outpatient setting, if her blood pressure is stable, we could  consider increasing her ACE inhibitors to twice  the dose of perindopril  at 8 mg once a day.  The patient will follow up with Dr. Clarene Duke in about  2 weeks.   A total of 100 mL of contrast was utilized for diagnostic angiography.   TECHNIQUE FOR PROCEDURE:  Under usual sterile precautions using a 6-  French right femoral arterial access, a 6-French multipurpose B-2  catheter was advanced to the ascending artery over a J wire.  The  catheter was gently advanced into the left ventricle and left  ventricular pressures were monitored.  Hand contrast was injected and  left ventriculogram was performed both in LAO and RAO projections.  The  catheter was flushed with saline and pulled back into the ascending  artery and pressure gradients across the aortic valve was checked.  Right coronary artery was selectively engaged and angiography is  performed.  Then the left main coronary artery was selectively engaged  and angiography was performed.  Then the same catheter was pulled back  in the abdomen and abdominal aortogram was performed.  Right femoral  angiography was performed through the arterial access sheath and the  access was closed with StarClose with excellent hemostasis.  The patient  tolerated the procedure well.      Cristy Hilts. Jacinto Halim, MD  Electronically Signed     JRG/MEDQ  D:  05/20/2006  T:  05/21/2006  Job:  [id number]   cc:   [name], M.D.  [name], M.D.

## 2010-12-17 ENCOUNTER — Other Ambulatory Visit: Payer: Self-pay | Admitting: Gastroenterology

## 2010-12-17 DIAGNOSIS — R102 Pelvic and perineal pain: Secondary | ICD-10-CM

## 2010-12-18 ENCOUNTER — Ambulatory Visit
Admission: RE | Admit: 2010-12-18 | Discharge: 2010-12-18 | Disposition: A | Discharge status: Home / Self Care | Payer: Medicare HMO | Source: Ambulatory Visit | Attending: Gastroenterology | Admitting: Gastroenterology

## 2010-12-18 ENCOUNTER — Other Ambulatory Visit: Payer: Self-pay | Admitting: Gastroenterology

## 2010-12-18 DIAGNOSIS — R102 Pelvic and perineal pain: Secondary | ICD-10-CM

## 2010-12-25 ENCOUNTER — Other Ambulatory Visit: Payer: Medicare HMO

## 2011-01-02 ENCOUNTER — Ambulatory Visit
Admission: RE | Admit: 2011-01-02 | Discharge: 2011-01-02 | Disposition: A | Discharge status: Home / Self Care | Payer: Medicare HMO | Source: Ambulatory Visit | Attending: Gastroenterology | Admitting: Gastroenterology

## 2011-01-02 MED ORDER — GADOBENATE DIMEGLUMINE 529 MG/ML IV SOLN
15.0000 mL | Freq: Once | INTRAVENOUS | Status: AC | PRN
  Administered 2011-01-02: 15 mL via INTRAVENOUS

## 2012-04-01 ENCOUNTER — Encounter: Payer: Self-pay | Admitting: Pharmacist Clinician (PhC)/ Clinical Pharmacy Specialist

## 2012-04-01 DIAGNOSIS — I714 Abdominal aortic aneurysm, without rupture: Secondary | ICD-10-CM | POA: Insufficient documentation

## 2012-04-03 ENCOUNTER — Other Ambulatory Visit (HOSPITAL_COMMUNITY): Payer: Self-pay | Admitting: Cardiovascular Disease

## 2012-04-03 DIAGNOSIS — I714 Abdominal aortic aneurysm, without rupture: Secondary | ICD-10-CM

## 2012-08-14 ENCOUNTER — Ambulatory Visit (HOSPITAL_COMMUNITY)
Admission: RE | Admit: 2012-08-14 | Discharge: 2012-08-14 | Disposition: A | Discharge status: Home / Self Care | Payer: Medicare Other | Source: Ambulatory Visit | Attending: Cardiovascular Disease | Admitting: Cardiovascular Disease

## 2012-08-14 DIAGNOSIS — I714 Abdominal aortic aneurysm, without rupture, unspecified: Secondary | ICD-10-CM | POA: Insufficient documentation

## 2012-08-14 HISTORY — PX: OTHER SURGICAL HISTORY: SHX169

## 2012-08-14 NOTE — Progress Notes (Signed)
Aorta Duplex Completed. Jermia Rigsby D  

## 2012-11-13 ENCOUNTER — Encounter: Payer: Self-pay | Admitting: *Deleted

## 2012-11-16 ENCOUNTER — Encounter: Payer: Self-pay | Admitting: Cardiology

## 2012-11-17 ENCOUNTER — Ambulatory Visit (INDEPENDENT_AMBULATORY_CARE_PROVIDER_SITE_OTHER): Payer: Medicare Other | Admitting: Cardiology

## 2012-11-17 ENCOUNTER — Encounter: Payer: Self-pay | Admitting: Cardiology

## 2012-11-17 VITALS — BP 120/70 | HR 76 | Ht 64.5 in | Wt 145.9 lb

## 2012-11-17 DIAGNOSIS — I2510 Atherosclerotic heart disease of native coronary artery without angina pectoris: Secondary | ICD-10-CM | POA: Insufficient documentation

## 2012-11-17 DIAGNOSIS — Z9861 Coronary angioplasty status: Secondary | ICD-10-CM

## 2012-11-17 DIAGNOSIS — K219 Gastro-esophageal reflux disease without esophagitis: Secondary | ICD-10-CM

## 2012-11-17 DIAGNOSIS — E785 Hyperlipidemia, unspecified: Secondary | ICD-10-CM

## 2012-11-17 DIAGNOSIS — I10 Essential (primary) hypertension: Secondary | ICD-10-CM

## 2012-11-17 DIAGNOSIS — I714 Abdominal aortic aneurysm, without rupture, unspecified: Secondary | ICD-10-CM

## 2012-11-17 DIAGNOSIS — I739 Peripheral vascular disease, unspecified: Secondary | ICD-10-CM | POA: Insufficient documentation

## 2012-11-17 DIAGNOSIS — I509 Heart failure, unspecified: Secondary | ICD-10-CM

## 2012-11-17 MED ORDER — OMEPRAZOLE 20 MG PO CPDR: 20.0000 mg | DELAYED_RELEASE_CAPSULE | Freq: Every day | ORAL | Status: DC

## 2012-11-17 NOTE — Patient Instructions (Addendum)
You have been referred to  See Dr Allyson Sabal  Concerning your discomfort with walking (R hip, abn doppler)  Order PRILOSEC FOR 3 MONTHS AFTER THAT HAVE YOUR PRIMARY REFILL IF NEEDED  Your physician wants you to follow-up in 91 MONTH Dr Herbie Baltimore.  You will receive a reminder letter in the mail two months in advance. If you don't receive a letter, please call our office to schedule the follow-up appointment.

## 2012-11-24 ENCOUNTER — Encounter: Payer: Self-pay | Admitting: Cardiology

## 2012-11-24 NOTE — Progress Notes (Signed)
Patient ID: [name], female   DOB: [birth year], 79 y.o.   MRN: [id number] PCP: Albertina Senegal, MD  Clinic Note: Chief Complaint  Patient presents with  . 6 months visit    no chest pain ,no sob, no edema   HPI: [name] is a 79 y.o. female with a PMH below who presents today for for followup of her coronary and peripheral vascular disease. She was a former patient of Dr. Caprice Kluver device off for the first time in January 2014. At that time she was exercising more regularly able to 20 minutes on elliptical trainer without getting short of breath. She lost 7 pounds. She noted though that she was limited by her hip and leg pain. Not chest discomfort dyspnea. She was due for her routine followup Doppler evaluation of her small asystematic abdominal aneurysm. I am now seeing her back in 6 month followup after her Doppler.  Interval History: She okay since her last visit. Most of the gym, but is really noticing phrases walking around [employer] that she'll have to stop a few times because her back her hip is bothering her when asked her closer has she ever had something like this before, she said she used to have it in her left hip/back/thigh and buttock but that improved after Dr. Jacinto Halim did a stent in her iliac artery. By now she's gotten to where it the discomfort in her leg is really limiting what she wants to do. The more we discussed that the mortise of the sounds to be like buttock and hip claudication from iliac disease.  The remainder of cardiac review of systems is as follows:  no chest pain or dyspnea on exertion positive for - palpitations and These are fleeting episodes of bradycardia and frequent. Do not bother her. negative for - edema, irregular heartbeat, loss of consciousness, murmur, orthopnea, paroxysmal nocturnal dyspnea, rapid heart rate or shortness of breath : Additional cardiac review of systems: Lightheadedness - no, dizziness -no, syncope/near-syncope - no;  TIA/amaurosis fugax - no Melena - no, hematochezia no; hematuria - no; nosebleeds - no; claudication - no    Past Medical History  Diagnosis Date  . Macular degeneration   . Hypothyroidism   . CAD S/P percutaneous coronary angioplasty     Cath 2010: 100% occlusion of RCA  (2 BMS stents finally occluded) w/ left to right collaterals; moderate LAD and circumflex disease.  EF 55-60%   . Abdominal aortic aneurysm     asymptomatic, 2012 -  2.6 x 2.4cm  . Hypertension   . Hyperlipidemia   . PAD (peripheral artery disease) 12/2005    s/p L Common Iliac Stent    Prior Cardiac Evaluation and Past Surgical History: Past Surgical History  Procedure Laterality Date  . Doppler echocardiography  07/20/2004    LV normal,trace mitral regurg;mild triscuspid  . Nm myocar perf wall motion  12/27/2011 -[place]    LV normal,EF 81 %  . Nm myocar perf wall motion  07/20/2004    71% EF;? INF ISCHEMIA  . Cardiac catheterization  11/2008    diffuse ISR in  RCA w/ good L-R collaterals; AV groove Cx - 60-70% stenosis with post stenosis ectasia;  EF 55%, mild  CAD in LAD -- Med Rx  . Abd aotra doppler  08/14/2012    distal mild dilatation 2.6 x 2.5cm unchanged;right cia 70-99%  new finding  . Iliac artery stent Left 12/30/2005    PTA anddirect stenting  of  left common iliac artery - 8.0 x 18 mm OmniLink stent  . Coronary angioplasty with stent placement  Jan 2004    RCA - 2 overlapping Pixel BMS 2.0 mm x 18 mm & 2.0 mm x 23 m   . Coronary angioplasty  April 2006    Cutting Balloon PTCA of RCA ISR    Allergies  Allergen Reactions  . Avastin (Bevacizumab)   . Beta Adrenergic Blockers Other (See Comments)    fatigue  . Isosorbide Other (See Comments)    Fatigue with higher doses  . Other     statin    Current Outpatient Prescriptions  Medication Sig Dispense Refill  . ALPRAZolam (XANAX) 0.25 MG tablet Take 0.25 mg by mouth as needed.      [name] aspirin EC 81 MG tablet Take 81 mg by mouth daily.       [name] atorvastatin (LIPITOR) 40 MG tablet Take 40 mg by mouth daily.      . cholecalciferol (VITAMIN D) 1000 UNITS tablet Take 2,000 Units by mouth daily.      . fish oil-omega-3 fatty acids 1000 MG capsule Take by mouth daily.      [name] glucosamine-chondroitin 500-400 MG tablet Take 1 tablet by mouth 2 (two) times daily.      . isosorbide mononitrate (IMDUR) 30 MG 24 hr tablet Take 30 mg by mouth daily.      [name] levothyroxine (SYNTHROID, LEVOTHROID) 88 MCG tablet Take 88 mcg by mouth daily.      . sertraline (ZOLOFT) 25 MG tablet Take 25 mg by mouth daily.      . trandolapril (MAVIK) 4 MG tablet Take 4 mg by mouth daily.      . Vitamin E 100 UNITS TABS Take by mouth.      [name] omeprazole (PRILOSEC) 20 MG capsule Take 1 capsule (20 mg total) by mouth daily.  90 capsule  0   No current facility-administered medications for this visit.    History   Social History  . Marital Status: Widowed    Spouse Name: N/A    Number of Children: N/A  . Years of Education: N/A   Occupational History  . Not on file.   Social History Main Topics  . Smoking status: Former Smoker    Quit date: 11/24/1992  . Smokeless tobacco: Not on file  . Alcohol Use: Not on file  . Drug Use: Not on file  . Sexually Active: Not on file   Other Topics Concern  . Not on file   Social History Narrative   She is a widowed mother of 4, grandmother of 6. She'd like to try to exercise at least 2 times a week doing elliptical trainer about 20 minutes at a time. But she's noticed that she's not able to do as much as she used to mostly due to the hip and buttock pain.    ROS: A comprehensive Review of Systems - Negative except Pertinent positives above. No abdominal pain or discomfort. No recent respiratory illnesses. occasionally she get hot flash-like feelings or chills, but nothing routine. She also gets intermittent GERD symptoms. This is in addition due to her mild palpitations that she gets.  PHYSICAL EXAM BP 120/70   Pulse 76  Ht 5' 4.5" (1.638 m)  Wt 145 lb 14.4 oz (66.18 kg)  BMI 24.67 kg/m2 General appearance: alert, cooperative, appears stated age, no distress and Well-nourished and well-groomed. Normal mood and affect. Does not seem to make much of anything  that would be concerning. Neck: no adenopathy, no carotid bruit, no JVD and supple, symmetrical, trachea midline Lungs: clear to auscultation bilaterally, normal percussion bilaterally and Nonlabored with good air movement. Heart: regular rate and rhythm, S1, S2 normal, no murmur, click, rub or gallop, normal apical impulse and Occasional ectopy Abdomen: soft, non-tender; bowel sounds normal; no masses,  no organomegaly and Soft bruit heard down at the right inguinal region Extremities: extremities normal, atraumatic, no cyanosis or edema, no edema, redness or tenderness in the calves or thighs, no ulcers, gangrene or trophic changes and varicose veins noted Pulses: 2+ and symmetric Neurologic: Grossly normal; with the exception of very poor vision from macular degeneration.  [id number] today: Yes Rate:76 , Rhythm: NSR; LAE, ST-T abnormalities: Nonspecific  Recent Labs: None Abdominal Aortic and Iliac Doppler April 2014:distal mild dilatation 2.6 x 2.5cm unchanged;right cia 70-99%  new finding  ASSESSMENT / PLAN:  PAD (peripheral artery disease) Very interesting finding would do right common iliac artery stenosis that was not previously noted. When I saw her in January for the first time, she was mentioning arthritis type pain in right hip. But on further questioning this as he sounds more like buttock and hip claudication which would go right along with iliac disease. In fact this is very similar symptoms to when she had left iliac disease.  The more I asked about it, the more that she admitted that the pain in her back, buttock and hip is very much keeping her from doing amount of activity she would like to do. In that sense it is lifestyle  limiting claudication.  Plan: Referred to Dr. Runell Gess for evaluation of possible PTA/stenting of right common iliac stenosis. She's had a recent stress test back in August of 2013 which was negative for ischemia.  CAD S/P percutaneous coronary angioplasty With all the exercise she is doing, she's never once noted any anginal symptoms since I have known her. The  small caliber RCA that had multiple stents placed in it finally succumbed to occlusion by the time of her 2000 and catheterization. I think enough collaterals and 4, that she is simply no longer having angina she is on aspirin, statin, and Imdur, and Mavik (an ACE inhibitor). She is not beta blocker because of a history of intolerance that predates me knowing her.  As she is asymptomatic, I would not proceed with any changes to her management or further evaluation since her last nuclear stress test a year ago was negative for ischemia.  HYPERLIPIDEMIA She is on Lipitor, and her labs are usually followed by her PCP.  HYPERTENSION Blood pressure is well-controlled today on her ACE inhibitor.  No change  Abdominal aortic aneurysm Interestingly, this the aneurysm that is relatively small, and it stayed at a small, but both iliacs associated with it have had significant disease. Just continue to monitor this with annual ultrasounds. This will allow [place] to follow her iliacs as well.  Chronic diastolic heart failure, NYHA class 1 Activities she may have had some diastolic heart failure in the past, but her EF has been normal and has no symptoms whatsoever of PND, orthopnea or edema to suggest active heart failure.  GERD (gastroesophageal reflux disease) Refill Prilosec for her 3 months, and then defer back to primary physician. As she will likely need to be on dual antiplatelet therapy for the time being post iliac stenting, I would like for the on a PPI prophylaxis. However if Plavix is used, then she would  need to get something  other than Prilosec.     Orders Placed This Encounter  Procedures  . EKG 12-Lead   Meds ordered this encounter  Medications  . omeprazole (PRILOSEC) 20 MG capsule    Sig: Take 1 capsule (20 mg total) by mouth daily.    Dispense:  90 capsule    Refill:  0   Referral to Dr. Nanetta Batty. She will followup with him post procedure. Followup: With me in one year or sooner if need be.  DAVID W. Herbie Baltimore, M.D., M.S. [facility] [address] [place], [place]  [address]  [phone] Pager # [phone]

## 2012-11-25 DIAGNOSIS — K219 Gastro-esophageal reflux disease without esophagitis: Secondary | ICD-10-CM | POA: Insufficient documentation

## 2012-11-25 NOTE — Assessment & Plan Note (Addendum)
Refill Prilosec for her 3 months, and then defer back to primary physician. As she will likely need to be on dual antiplatelet therapy for the time being post iliac stenting, I would like for the on a PPI prophylaxis. However if Plavix is used, then she would need to get something other than Prilosec.

## 2012-11-25 NOTE — Assessment & Plan Note (Signed)
With all the exercise she is doing, she's never once noted any anginal symptoms since I have known her. The  small caliber RCA that had multiple stents placed in it finally succumbed to occlusion by the time of her 2000 and catheterization. I think enough collaterals and 4, that she is simply no longer having angina she is on aspirin, statin, and Imdur, and Mavik (an ACE inhibitor). She is not beta blocker because of a history of intolerance that predates me knowing her.  As she is asymptomatic, I would not proceed with any changes to her management or further evaluation since her last nuclear stress test a year ago was negative for ischemia.

## 2012-11-25 NOTE — Assessment & Plan Note (Signed)
Interestingly, this the aneurysm that is relatively small, and it stayed at a small, but both iliacs associated with it have had significant disease. Just continue to monitor this with annual ultrasounds. This will allow [place] to follow her iliacs as well.

## 2012-11-25 NOTE — Assessment & Plan Note (Signed)
Very interesting finding would do right common iliac artery stenosis that was not previously noted. When I saw her in January for the first time, she was mentioning arthritis type pain in right hip. But on further questioning this as he sounds more like buttock and hip claudication which would go right along with iliac disease. In fact this is very similar symptoms to when she had left iliac disease.  The more I asked about it, the more that she admitted that the pain in her back, buttock and hip is very much keeping her from doing amount of activity she would like to do. In that sense it is lifestyle limiting claudication.  Plan: Referred to Dr. Runell Gess for evaluation of possible PTA/stenting of right common iliac stenosis. She's had a recent stress test back in August of 2013 which was negative for ischemia.

## 2012-11-25 NOTE — Assessment & Plan Note (Signed)
Blood pressure is well-controlled today on her ACE inhibitor.  No change

## 2012-11-25 NOTE — Assessment & Plan Note (Signed)
She is on Lipitor, and her labs are usually followed by her PCP.

## 2012-11-25 NOTE — Assessment & Plan Note (Signed)
Activities she may have had some diastolic heart failure in the past, but her EF has been normal and has no symptoms whatsoever of PND, orthopnea or edema to suggest active heart failure.

## 2012-12-06 ENCOUNTER — Ambulatory Visit (INDEPENDENT_AMBULATORY_CARE_PROVIDER_SITE_OTHER): Payer: Medicare Other | Admitting: Cardiovascular Disease

## 2012-12-06 ENCOUNTER — Encounter: Payer: Self-pay | Admitting: Cardiovascular Disease

## 2012-12-06 VITALS — BP 138/88 | HR 76 | Ht 64.5 in | Wt 148.6 lb

## 2012-12-06 DIAGNOSIS — Z79899 Other long term (current) drug therapy: Secondary | ICD-10-CM

## 2012-12-06 DIAGNOSIS — I739 Peripheral vascular disease, unspecified: Secondary | ICD-10-CM

## 2012-12-06 DIAGNOSIS — F17200 Nicotine dependence, unspecified, uncomplicated: Secondary | ICD-10-CM

## 2012-12-06 DIAGNOSIS — Z720 Tobacco use: Secondary | ICD-10-CM

## 2012-12-06 DIAGNOSIS — Z01810 Encounter for preprocedural cardiovascular examination: Secondary | ICD-10-CM

## 2012-12-06 DIAGNOSIS — D689 Coagulation defect, unspecified: Secondary | ICD-10-CM

## 2012-12-06 NOTE — Assessment & Plan Note (Signed)
Status post PTA and stenting of left common iliac artery by Dr. Yates Decamp 12/29/05. Patient does have a small abdominal aortic angers a measuring 2.6 cm. At the time of duplex imaging 08/14/12 there was a suggestion of a right common iliac artery stenosis. Patient does complain of right hip claudication. We will obtain formal lotion with Doppler studies and if indeed she does have significant disease we'll plan on performing angiography and potential percutaneous intervention.

## 2012-12-06 NOTE — Progress Notes (Signed)
12/06/2012 [name]   09-Nov-1929  [id number]  Primary Physician Albertina Senegal, MD Primary Cardiologist: Runell Gess MD [name]   HPI:  Ms. Stipe is an 79 year old mildly overweight Caucasian female accompanied by her daughter today. She is a patient of Dr. Onalee Hua Hardings. She has a history of CAD status post stenting of her RCA in the past. Her last cardiac catheterization performed 11/26/08 by Dr. Merri Ray and revealed an occluded RCA stent with moderate AV groove circumflex disease and left to right collaterals. She does get short of breath but denies chest pain. There is a history of COPD. Her other problems include hypertension and hyperlipidemia. She did have a left common iliac artery stent performed by Dr. Jacinto Halim 12/29/05 her left hip claudication. She now complains of right hip claudication.   Current Outpatient Prescriptions  Medication Sig Dispense Refill  . ALPRAZolam (XANAX) 0.25 MG tablet Take 0.25 mg by mouth as needed.      [name] aspirin EC 81 MG tablet Take 81 mg by mouth daily.      [name] atorvastatin (LIPITOR) 40 MG tablet Take 40 mg by mouth daily.      . fish oil-omega-3 fatty acids 1000 MG capsule Take by mouth daily.      [name] glucosamine-chondroitin 500-400 MG tablet Take 1 tablet by mouth 2 (two) times daily.      . isosorbide mononitrate (IMDUR) 30 MG 24 hr tablet Take 30 mg by mouth daily.      [name] levothyroxine (SYNTHROID, LEVOTHROID) 88 MCG tablet Take 88 mcg by mouth daily.      [name] omeprazole (PRILOSEC) 20 MG capsule Take 1 capsule (20 mg total) by mouth daily.  90 capsule  0  . sertraline (ZOLOFT) 25 MG tablet Take 25 mg by mouth daily.      . trandolapril (MAVIK) 4 MG tablet Take 4 mg by mouth daily.       No current facility-administered medications for this visit.    Allergies  Allergen Reactions  . Avastin [Bevacizumab]   . Beta Adrenergic Blockers Other (See Comments)    fatigue  . Isosorbide Other (See Comments)    Fatigue with higher  doses  . Other     statin    History   Social History  . Marital Status: Widowed    Spouse Name: N/A    Number of Children: N/A  . Years of Education: N/A   Occupational History  . Not on file.   Social History Main Topics  . Smoking status: Former Smoker    Quit date: 11/24/1992  . Smokeless tobacco: Not on file  . Alcohol Use: Not on file  . Drug Use: Not on file  . Sexual Activity: Not on file   Other Topics Concern  . Not on file   Social History Narrative   She is a widowed mother of 4, grandmother of 6. She'd like to try to exercise at least 2 times a week doing elliptical trainer about 20 minutes at a time. But she's noticed that she's not able to do as much as she used to mostly due to the hip and buttock pain.     Review of Systems: General: negative for chills, fever, night sweats or weight changes.  Cardiovascular: negative for chest pain, dyspnea on exertion, edema, orthopnea, palpitations, paroxysmal nocturnal dyspnea or shortness of breath Dermatological: negative for rash Respiratory: negative for cough or wheezing Urologic: negative for hematuria Abdominal: negative for nausea, vomiting, diarrhea, bright  red blood per rectum, melena, or hematemesis Neurologic: negative for visual changes, syncope, or dizziness All other systems reviewed and are otherwise negative except as noted above.    Blood pressure 138/88, pulse 76, height 5' 4.5" (1.638 m), weight 148 lb 9.6 oz (67.405 kg).  General appearance: alert and no distress Neck: no adenopathy, no carotid bruit, no JVD, supple, symmetrical, trachea midline and thyroid not enlarged, symmetric, no tenderness/mass/nodules Lungs: clear to auscultation bilaterally Heart: regular rate and rhythm, S1, S2 normal, no murmur, click, rub or gallop Extremities: 1+ right and 2+ left femoral pulses without bruits 2+ pedal pulses  EKG not performed today  ASSESSMENT AND PLAN:   PAD (peripheral artery  disease) Status post PTA and stenting of left common iliac artery by Dr. Yates Decamp 12/29/05. Patient does have a small abdominal aortic angers a measuring 2.6 cm. At the time of duplex imaging 08/14/12 there was a suggestion of a right common iliac artery stenosis. Patient does complain of right hip claudication. We will obtain formal lotion with Doppler studies and if indeed she does have significant disease we'll plan on performing angiography and potential percutaneous intervention.      Runell Gess MD FACP,FACC,FAHA, [facility] 12/06/2012 4:38 PM

## 2012-12-06 NOTE — Patient Instructions (Addendum)
Dr Allyson Sabal would like for you to have lower extremity arterial dopplers done prior to the peripheral angiogram.    Dr. Allyson Sabal has ordered a peripheral angiogram to be done at [facility].  This procedure is going to look at the bloodflow in your lower extremities.  If Dr. Allyson Sabal is able to open up the arteries, you will have to spend one night in the hospital.  If he is not able to open the arteries, you will be able to go home that same day.    After the procedure, you will not be allowed to drive for 3 days or push, pull, or lift anything greater than 10 lbs for one week.    You will be required to have bloodwork and a chest xray prior to your procedure.  Our scheduler will advise you on when these items need to be done.

## 2012-12-07 ENCOUNTER — Encounter (HOSPITAL_COMMUNITY): Payer: Self-pay | Admitting: Cardiovascular Disease

## 2012-12-08 NOTE — Progress Notes (Signed)
Yes

## 2012-12-12 ENCOUNTER — Ambulatory Visit (HOSPITAL_COMMUNITY)
Admission: RE | Admit: 2012-12-12 | Discharge: 2012-12-12 | Disposition: A | Discharge status: Home / Self Care | Payer: Medicare Other | Source: Ambulatory Visit | Attending: Cardiology | Admitting: Cardiology

## 2012-12-12 DIAGNOSIS — I739 Peripheral vascular disease, unspecified: Secondary | ICD-10-CM | POA: Insufficient documentation

## 2012-12-12 NOTE — Progress Notes (Signed)
Arterial Duplex Completed. °Brianna L Mazza,RVT °

## 2012-12-21 ENCOUNTER — Ambulatory Visit: Payer: Medicare Other | Admitting: Pharmacist Clinician (PhC)/ Clinical Pharmacy Specialist

## 2013-01-02 ENCOUNTER — Ambulatory Visit
Admission: RE | Admit: 2013-01-02 | Discharge: 2013-01-02 | Disposition: A | Discharge status: Home / Self Care | Payer: Medicare Other | Source: Ambulatory Visit | Attending: Cardiovascular Disease | Admitting: Cardiovascular Disease

## 2013-01-02 DIAGNOSIS — Z720 Tobacco use: Secondary | ICD-10-CM

## 2013-01-02 DIAGNOSIS — I739 Peripheral vascular disease, unspecified: Secondary | ICD-10-CM

## 2013-01-02 LAB — BASIC METABOLIC PANEL
BUN: 12 mg/dL (ref 6–23)
Calcium: 10.1 mg/dL (ref 8.4–10.5)
Creat: 0.67 mg/dL (ref 0.50–1.10)
Glucose, Bld: 99 mg/dL (ref 70–99)

## 2013-01-02 LAB — CBC
Platelets: 191 10*3/uL (ref 150–400)
RDW: 13.7 % (ref 11.5–15.5)
WBC: 5.5 10*3/uL (ref 4.0–10.5)

## 2013-01-02 LAB — PROTIME-INR
INR: 1.04 (ref <1.50)
Prothrombin Time: 13.6 seconds (ref 11.6–15.2)

## 2013-01-08 ENCOUNTER — Encounter (HOSPITAL_COMMUNITY): Payer: Self-pay | Admitting: General Practice

## 2013-01-08 ENCOUNTER — Observation Stay (HOSPITAL_COMMUNITY)
Admission: RE | Admit: 2013-01-08 | Discharge: 2013-01-09 | Disposition: A | Discharge status: Home / Self Care | Payer: Medicare Other | Source: Ambulatory Visit | Attending: Cardiovascular Disease | Admitting: Cardiovascular Disease

## 2013-01-08 ENCOUNTER — Encounter (HOSPITAL_COMMUNITY)
Admission: RE | Disposition: A | Discharge status: Home / Self Care | Payer: Self-pay | Source: Ambulatory Visit | Attending: Cardiovascular Disease

## 2013-01-08 DIAGNOSIS — I708 Atherosclerosis of other arteries: Principal | ICD-10-CM | POA: Insufficient documentation

## 2013-01-08 DIAGNOSIS — E663 Overweight: Secondary | ICD-10-CM | POA: Insufficient documentation

## 2013-01-08 DIAGNOSIS — I714 Abdominal aortic aneurysm, without rupture, unspecified: Secondary | ICD-10-CM

## 2013-01-08 DIAGNOSIS — I2510 Atherosclerotic heart disease of native coronary artery without angina pectoris: Secondary | ICD-10-CM

## 2013-01-08 DIAGNOSIS — Z01810 Encounter for preprocedural cardiovascular examination: Secondary | ICD-10-CM

## 2013-01-08 DIAGNOSIS — I739 Peripheral vascular disease, unspecified: Secondary | ICD-10-CM

## 2013-01-08 DIAGNOSIS — Z79899 Other long term (current) drug therapy: Secondary | ICD-10-CM | POA: Insufficient documentation

## 2013-01-08 DIAGNOSIS — I10 Essential (primary) hypertension: Secondary | ICD-10-CM

## 2013-01-08 DIAGNOSIS — I70219 Atherosclerosis of native arteries of extremities with intermittent claudication, unspecified extremity: Secondary | ICD-10-CM

## 2013-01-08 DIAGNOSIS — I7779 Dissection of other artery: Secondary | ICD-10-CM | POA: Insufficient documentation

## 2013-01-08 HISTORY — PX: ABDOMINAL AORTAGRAM: SHX5454

## 2013-01-08 HISTORY — DX: Low back pain: M54.5

## 2013-01-08 HISTORY — DX: Major depressive disorder, single episode, unspecified: F32.9

## 2013-01-08 HISTORY — DX: Depression, unspecified: F32.A

## 2013-01-08 HISTORY — DX: Personal history of peptic ulcer disease: Z87.11

## 2013-01-08 HISTORY — DX: Low back pain, unspecified: M54.50

## 2013-01-08 HISTORY — PX: ANGIOPLASTY: SHX39

## 2013-01-08 HISTORY — DX: Migraine, unspecified, not intractable, without status migrainosus: G43.909

## 2013-01-08 HISTORY — DX: Shortness of breath: R06.02

## 2013-01-08 HISTORY — DX: Other chronic pain: G89.29

## 2013-01-08 HISTORY — DX: Pneumonia, unspecified organism: J18.9

## 2013-01-08 HISTORY — PX: LOWER EXTREMITY ANGIOGRAM: SHX5508

## 2013-01-08 HISTORY — DX: Acute myocardial infarction, unspecified: I21.9

## 2013-01-08 HISTORY — DX: Personal history of other diseases of the digestive system: Z87.19

## 2013-01-08 SURGERY — ANGIOGRAM, LOWER EXTREMITY
Anesthesia: LOCAL

## 2013-01-08 MED ORDER — ATORVASTATIN CALCIUM 40 MG PO TABS
40.0000 mg | ORAL_TABLET | Freq: Every day | ORAL | Status: DC
  Administered 2013-01-08: 40 mg via ORAL
  Filled 2013-01-08 (×2): qty 1

## 2013-01-08 MED ORDER — SERTRALINE HCL 25 MG PO TABS
25.0000 mg | ORAL_TABLET | Freq: Every day | ORAL | Status: DC
  Filled 2013-01-08 (×2): qty 1

## 2013-01-08 MED ORDER — ONDANSETRON HCL 4 MG/2ML IJ SOLN
INTRAMUSCULAR | Status: AC
  Filled 2013-01-08: qty 2

## 2013-01-08 MED ORDER — ISOSORBIDE MONONITRATE ER 30 MG PO TB24
30.0000 mg | ORAL_TABLET | Freq: Every day | ORAL | Status: DC
  Filled 2013-01-08: qty 1

## 2013-01-08 MED ORDER — DIAZEPAM 5 MG PO TABS
5.0000 mg | ORAL_TABLET | ORAL | Status: AC
  Administered 2013-01-08: 5 mg via ORAL
  Filled 2013-01-08: qty 1

## 2013-01-08 MED ORDER — FENTANYL CITRATE 0.05 MG/ML IJ SOLN
INTRAMUSCULAR | Status: AC
  Filled 2013-01-08: qty 2

## 2013-01-08 MED ORDER — TRANDOLAPRIL 4 MG PO TABS
4.0000 mg | ORAL_TABLET | Freq: Every day | ORAL | Status: DC
  Filled 2013-01-08: qty 1

## 2013-01-08 MED ORDER — ACETAMINOPHEN 325 MG PO TABS: 650.0000 mg | ORAL_TABLET | ORAL | Status: DC | PRN

## 2013-01-08 MED ORDER — HEPARIN (PORCINE) IN NACL 2-0.9 UNIT/ML-% IJ SOLN
INTRAMUSCULAR | Status: AC
  Filled 2013-01-08: qty 1000

## 2013-01-08 MED ORDER — LIDOCAINE HCL (PF) 1 % IJ SOLN
INTRAMUSCULAR | Status: AC
  Filled 2013-01-08: qty 30

## 2013-01-08 MED ORDER — ASPIRIN 81 MG PO CHEW
324.0000 mg | CHEWABLE_TABLET | ORAL | Status: AC
  Administered 2013-01-08: 324 mg via ORAL
  Filled 2013-01-08: qty 4

## 2013-01-08 MED ORDER — ONDANSETRON HCL 4 MG/2ML IJ SOLN: 4.0000 mg | Freq: Four times a day (QID) | INTRAMUSCULAR | Status: DC | PRN

## 2013-01-08 MED ORDER — SODIUM CHLORIDE 0.9 % IV SOLN
INTRAVENOUS | Status: AC
  Administered 2013-01-08: 12:00:00 via INTRAVENOUS

## 2013-01-08 MED ORDER — SODIUM CHLORIDE 0.9 % IJ SOLN
3.0000 mL | Freq: Two times a day (BID) | INTRAMUSCULAR | Status: DC
  Administered 2013-01-08: 21:00:00 3 mL via INTRAVENOUS

## 2013-01-08 MED ORDER — ALPRAZOLAM 0.25 MG PO TABS
0.2500 mg | ORAL_TABLET | ORAL | Status: DC | PRN
  Administered 2013-01-08 (×2): 0.25 mg via ORAL
  Filled 2013-01-08 (×3): qty 1

## 2013-01-08 MED ORDER — ASPIRIN EC 81 MG PO TBEC
81.0000 mg | DELAYED_RELEASE_TABLET | Freq: Every day | ORAL | Status: DC
  Filled 2013-01-08: qty 1

## 2013-01-08 MED ORDER — LEVOTHYROXINE SODIUM 88 MCG PO TABS
88.0000 ug | ORAL_TABLET | Freq: Every day | ORAL | Status: DC
  Filled 2013-01-08 (×2): qty 1

## 2013-01-08 MED ORDER — PANTOPRAZOLE SODIUM 40 MG PO TBEC
40.0000 mg | DELAYED_RELEASE_TABLET | Freq: Every day | ORAL | Status: DC
  Administered 2013-01-08: 17:00:00 40 mg via ORAL
  Filled 2013-01-08: qty 1

## 2013-01-08 MED ORDER — SODIUM CHLORIDE 0.9 % IV SOLN
INTRAVENOUS | Status: DC
  Administered 2013-01-08: 08:00:00 via INTRAVENOUS

## 2013-01-08 MED ORDER — MIDAZOLAM HCL 2 MG/2ML IJ SOLN
INTRAMUSCULAR | Status: AC
  Filled 2013-01-08: qty 2

## 2013-01-08 MED ORDER — SODIUM CHLORIDE 0.9 % IJ SOLN: 3.0000 mL | INTRAMUSCULAR | Status: DC | PRN

## 2013-01-08 NOTE — CV Procedure (Signed)
[name] is a 79 y.o. female    [id number] LOCATION:  FACILITY: [facility]  PHYSICIAN: Nanetta Batty, M.D. 03/18/1930   DATE OF PROCEDURE:  01/08/2013  DATE OF DISCHARGE:   PV ANGIO     History obtained from chart review.[name] is an 79 year old mildly overweight Caucasian female accompanied by her daughter today. She is a patient of Dr. Onalee Hua Hardings. She has a history of CAD status post stenting of her RCA in the past. Her last cardiac catheterization performed 11/26/08 by Dr. Merri Ray and revealed an occluded RCA stent with moderate AV groove circumflex disease and left to right collaterals. She does get short of breath but denies chest pain. There is a history of COPD. Her other problems include hypertension and hyperlipidemia. She did have a left common iliac artery stent performed by Dr. Jacinto Halim 12/29/05 her left hip claudication. She now complains of right hip claudication.    PROCEDURE DESCRIPTION:    The patient was brought to the second floor [facility] cath lab in the postabsorptive state. She was premedicated with Valium 5 mg by mouth, IV Versed and fentanyl. It should be noted that she did become nauseated and hypotensive after administration of Versed and fentanyl.her right groinwas prepped and shaved in usual sterile fashion. Xylocaine 1% was used for local anesthesia. A 5 French sheath was inserted into the right common femoral artery using standard Seldinger technique.a 5 French pigtail catheter was used for distal abdominal aortography, bilateral iliac angiography and bifemoral runoff using bolus chase digital subtraction step staple technique. Visipaque dye was used for the entirety of the case. Retrograde aortic pressure was monitored during the case. A pullback gradient was performed using a 5 French endhole catheter placed in the distal abdominal aorta after administration of 200 mcg of intra-arterial nitroglycerin via the SideArm sheath.   HEMODYNAMICS:    AO  SYSTOLIC/AO DIASTOLIC: 165/62   ANGIOGRAPHIC RESULTS:   1: Abdominal aortogram-there was a small distal abdominal aortic aneurysm noted.  2: Left lower extremity-the left common iliac artery stent was widely patent. There was a 40-50% smooth stenosis in the mid left SFA with three-vessel runoff. There was slow flow noted.  3: Right lower extremity-there was an 80% ostial, calcified, right common iliac artery stenosis. There was a 40 mm pullback gradient noted after administration of 200 mcg of intra-arterial nitroglycerin glycerin. There was a diffuse 60% hypodense lesion in the mid right SFA with three-vessel runoff.  IMPRESSION:[name] has a high-grade calcified ostial right common iliac artery stenosis going into a small distal abdominal aortic aneurysm. There is a significant gradient. This corresponds with her Doppler abnormality. Because there was a linear dissection in the right common femoral artery as a result of chief implant it was elected to only perform a diagnostic procedure today and to let the dissection heal. The sheath was removed and pressure was held on the groin to achieve hemostasis. The patient left the lab in stable condition. She'll will remain recumbent for 4 hours and will be hydrated overnight.  Runell Gess MD, [facility] 01/08/2013 9:56 AM

## 2013-01-08 NOTE — H&P (Signed)
  H & P will be scanned in.  Pt was reexamined and existing H & P reviewed. No changes found.  Runell Gess, MD [facility] 01/08/2013 8:51 AM

## 2013-01-09 ENCOUNTER — Encounter: Payer: Self-pay | Admitting: *Deleted

## 2013-01-09 DIAGNOSIS — I714 Abdominal aortic aneurysm, without rupture: Secondary | ICD-10-CM

## 2013-01-09 DIAGNOSIS — I2510 Atherosclerotic heart disease of native coronary artery without angina pectoris: Secondary | ICD-10-CM

## 2013-01-09 DIAGNOSIS — I739 Peripheral vascular disease, unspecified: Secondary | ICD-10-CM

## 2013-01-09 LAB — BASIC METABOLIC PANEL
Calcium: 8.9 mg/dL (ref 8.4–10.5)
Creatinine, Ser: 0.69 mg/dL (ref 0.50–1.10)
GFR calc Af Amer: >90 mL/min (ref >90)
GFR calc non Af Amer: 79 mL/min — ABNORMAL LOW (ref >90)
Glucose, Bld: 94 mg/dL (ref 70–99)
Sodium: 140 mEq/L (ref 135–145)

## 2013-01-09 LAB — CBC
MCHC: 34.3 g/dL (ref 30.0–36.0)
MCV: 98.5 fL (ref 78.0–100.0)
Platelets: 138 10*3/uL — ABNORMAL LOW (ref 150–400)
RBC: 3.4 MIL/uL — ABNORMAL LOW (ref 3.87–5.11)
WBC: 6 10*3/uL (ref 4.0–10.5)

## 2013-01-09 NOTE — Progress Notes (Signed)
Subjective: No complaints  Objective: Vital signs in last 24 hours: Temp:  [97.7 F (36.5 C)-98.1 F (36.7 C)] 97.9 F (36.6 C) (09/23 0751) Pulse Rate:  [52-76] 71 (09/23 0751) Resp:  [16-20] 18 (09/23 0751) BP: (101-165)/(36-137) 101/62 mmHg (09/23 0751) SpO2:  [90 %-95 %] 95 % (09/23 0751) Weight:  [146 lb 13.2 oz (66.6 kg)] 146 lb 13.2 oz (66.6 kg) (09/23 0042) Last BM Date: 01/08/13  Intake/Output from previous day: 09/22 0701 - 09/23 0700 In: 1440 [P.O.:840; I.V.:600] Out: 400 [Urine:400] Intake/Output this shift:    Medications Current Facility-Administered Medications  Medication Dose Route Frequency Provider Last Rate Last Dose  . acetaminophen (TYLENOL) tablet 650 mg  650 mg Oral Q4H PRN Runell Gess, MD      . ALPRAZolam Prudy Feeler) tablet 0.25 mg  0.25 mg Oral PRN Runell Gess, MD   0.25 mg at 01/08/13 2307  . aspirin EC tablet 81 mg  81 mg Oral Daily Runell Gess, MD      . atorvastatin (LIPITOR) tablet 40 mg  40 mg Oral q1800 Runell Gess, MD   40 mg at 01/08/13 1724  . isosorbide mononitrate (IMDUR) 24 hr tablet 30 mg  30 mg Oral Daily Runell Gess, MD      . levothyroxine (SYNTHROID, LEVOTHROID) tablet 88 mcg  88 mcg Oral QAC breakfast Runell Gess, MD      . ondansetron [facility]) injection 4 mg  4 mg Intravenous Q6H PRN Runell Gess, MD      . pantoprazole (PROTONIX) EC tablet 40 mg  40 mg Oral QAC breakfast Runell Gess, MD   40 mg at 01/08/13 1724  . sertraline (ZOLOFT) tablet 25 mg  25 mg Oral Daily Runell Gess, MD      . sodium chloride 0.9 % injection 3 mL  3 mL Intravenous Q12H Runell Gess, MD   3 mL at 01/08/13 2124  . trandolapril (MAVIK) tablet 4 mg  4 mg Oral Daily Runell Gess, MD        PE: General appearance: alert, cooperative and no distress Lungs: clear to auscultation bilaterally Heart: regular rate and rhythm, S1, S2 normal, no murmur, click, rub or gallop Extremities: NJo LEE Pulses: 2+ and  symmetric left DP/PT 2+.  Right PT 2+, DP 1+  Skin: Right groin:  No ecchymosis, erythema or tenderness. Neurologic: Grossly normal  Lab Results:   Recent Labs  01/09/13 0630  WBC 6.0  HGB 11.5*  HCT 33.5*  PLT 138*   BMET  Recent Labs  01/09/13 0630  NA 140  K 3.7  CL 105  CO2 24  GLUCOSE 94  BUN 17  CREATININE 0.69  CALCIUM 8.9    Assessment/Plan   Active Problems:   HYPERTENSION   PAD (peripheral artery disease)  Plan:  The pt has a high-grade calcified ostial right common iliac artery stenosis going into a small distal abdominal aortic aneurysm.  There was a linear dissection in the right common femoral artery.  This will be allowed to heal and the patient will be brought back for treatment.   OK for DC home.     LOS: 1 day    HAGER, BRYAN 01/09/2013 8:07 AM   Agree with note written by [facility]  OK for D/C home with ROV with me 2-3 weeks. Exam benign. S/P PV angio. Will prob need RCIA PTA/Stent +/- Diamondback atherectomy.   Runell Gess 01/09/2013 8:56  AM     

## 2013-01-09 NOTE — Discharge Summary (Signed)
Physician Discharge Summary  Patient ID: ADDALEIGH [name] MRN: [id number] DOB/AGE: Apr 02, 1930 79 y.o.  Admit date: 01/08/2013 Discharge date: 01/09/2013  Admission Diagnoses:  PAD,   Discharge Diagnoses:  Active Problems:   HYPERTENSION   Abdominal aortic aneurysm   PAD (peripheral artery disease)   Discharged Condition: stable  Hospital Course:  [name] is an 79 year old mildly overweight Caucasian female. She is a patient of Dr. Onalee Hua Hardings. She has a history of CAD status post stenting of her RCA in the past. Her last cardiac catheterization performed 11/26/08 by Dr. Merri Ray and revealed an occluded RCA stent with moderate AV groove circumflex disease and left to right collaterals. She does get short of breath but denies chest pain. There is a history of COPD. Her other problems include hypertension and hyperlipidemia. She did have a left common iliac artery stent performed by Dr. Jacinto Halim 12/29/05 for left hip claudication. She now complains of right hip claudication.  She presented for PV angiogram which revealed a high-grade calcified ostial right common iliac artery stenosis going into a small distal abdominal aortic aneurysm. Because there was a linear dissection in the right common femoral artery and intervention was not performed.  The dissection will be allowed to heal and the patient will be brought back for probable RCIA PTA/Stent +/- Diamondback atherectomy.  The patient was seen by Dr. Allyson Sabal who felt she was stable for DC home.  A follow up appt has been arranged for ~2weeks.  Consults: None  Significant Diagnostic Studies: PV angiogram PROCEDURE DESCRIPTION:  The patient was brought to the second floor [facility] cath lab in the postabsorptive state. She was premedicated with Valium 5 mg by mouth, IV Versed and fentanyl. It should be noted that she did become nauseated and hypotensive after administration of Versed and fentanyl.her right groinwas prepped and shaved in  usual sterile fashion. Xylocaine 1% was used for local anesthesia. A 5 French sheath was inserted into the right common femoral artery using standard Seldinger technique.a 5 French pigtail catheter was used for distal abdominal aortography, bilateral iliac angiography and bifemoral runoff using bolus chase digital subtraction step staple technique. Visipaque dye was used for the entirety of the case. Retrograde aortic pressure was monitored during the case. A pullback gradient was performed using a 5 French endhole catheter placed in the distal abdominal aorta after administration of 200 mcg of intra-arterial nitroglycerin via the SideArm sheath.  HEMODYNAMICS:  AO SYSTOLIC/AO DIASTOLIC: 165/62  ANGIOGRAPHIC RESULTS:  1: Abdominal aortogram-there was a small distal abdominal aortic aneurysm noted.  2: Left lower extremity-the left common iliac artery stent was widely patent. There was a 40-50% smooth stenosis in the mid left SFA with three-vessel runoff. There was slow flow noted.  3: Right lower extremity-there was an 80% ostial, calcified, right common iliac artery stenosis. There was a 40 mm pullback gradient noted after administration of 200 mcg of intra-arterial nitroglycerin glycerin. There was a diffuse 60% hypodense lesion in the mid right SFA with three-vessel runoff.  IMPRESSION:[name] has a high-grade calcified ostial right common iliac artery stenosis going into a small distal abdominal aortic aneurysm. There is a significant gradient. This corresponds with her Doppler abnormality. Because there was a linear dissection in the right common femoral artery as a result of chief implant it was elected to only perform a diagnostic procedure today and to let the dissection heal. The sheath was removed and pressure was held on the groin to achieve hemostasis. The patient left  the lab in stable condition. She'll will remain recumbent for 4 hours and will be hydrated overnight.  Runell Gess MD,  [facility]  01/08/2013  Treatments:See above  Discharge Exam: Blood pressure 101/62, pulse 71, temperature 97.9 F (36.6 C), temperature source Oral, resp. rate 18, height 5' 4.5" (1.638 m), weight 146 lb 13.2 oz (66.6 kg), SpO2 95.00%.  Disposition:   Discharge Orders   Future Orders Complete By Expires   Diet - low sodium heart healthy  As directed    Discharge instructions  As directed    Comments:     No lifting more than a half gallon of milk or driving for three days.   Increase activity slowly  As directed        Medication List         ALPRAZolam 0.5 MG tablet  Commonly known as:  XANAX  Take 0.5 mg by mouth daily as needed for anxiety.     aspirin EC 81 MG tablet  Take 81 mg by mouth daily.     atorvastatin 40 MG tablet  Commonly known as:  LIPITOR  Take 40 mg by mouth daily.     cholecalciferol 1000 UNITS tablet  Commonly known as:  VITAMIN D  Take 1,000 Units by mouth daily.     fish oil-omega-3 fatty acids 1000 MG capsule  Take by mouth daily.     glucosamine-chondroitin 500-400 MG tablet  Take 1 tablet by mouth 2 (two) times daily.     isosorbide mononitrate 30 MG 24 hr tablet  Commonly known as:  IMDUR  Take 30 mg by mouth daily.     levothyroxine 88 MCG tablet  Commonly known as:  SYNTHROID, LEVOTHROID  Take 88 mcg by mouth daily.     omeprazole 20 MG capsule  Commonly known as:  PRILOSEC  Take 1 capsule (20 mg total) by mouth daily.     sertraline 25 MG tablet  Commonly known as:  ZOLOFT  Take 25 mg by mouth daily.     trandolapril 4 MG tablet  Commonly known as:  MAVIK  Take 2 mg by mouth daily.     vitamin E 400 UNIT capsule  Generic drug:  vitamin E  Take 400 Units by mouth daily.         SignedWilburt Finlay 01/09/2013, 11:03 AM

## 2013-01-23 ENCOUNTER — Encounter: Payer: Self-pay | Admitting: Cardiovascular Disease

## 2013-01-23 ENCOUNTER — Ambulatory Visit (INDEPENDENT_AMBULATORY_CARE_PROVIDER_SITE_OTHER): Payer: Medicare Other | Admitting: Cardiovascular Disease

## 2013-01-23 ENCOUNTER — Ambulatory Visit: Payer: Medicare Other | Admitting: Cardiovascular Disease

## 2013-01-23 VITALS — BP 150/80 | HR 70 | Ht 64.5 in | Wt 149.3 lb

## 2013-01-23 DIAGNOSIS — Z01818 Encounter for other preprocedural examination: Secondary | ICD-10-CM

## 2013-01-23 DIAGNOSIS — I739 Peripheral vascular disease, unspecified: Secondary | ICD-10-CM

## 2013-01-23 DIAGNOSIS — I10 Essential (primary) hypertension: Secondary | ICD-10-CM

## 2013-01-23 DIAGNOSIS — I2510 Atherosclerotic heart disease of native coronary artery without angina pectoris: Secondary | ICD-10-CM

## 2013-01-23 MED ORDER — NITROGLYCERIN 0.4 MG SL SUBL: 0.4000 mg | SUBLINGUAL_TABLET | SUBLINGUAL | Status: DC | PRN

## 2013-01-23 NOTE — Assessment & Plan Note (Signed)
Patient underwent abdominal aortography with bifemoral runoff 01/06/13 to the right femoral approach. This is coupled to a by a small subintimal dissection secondary to the wire. The results revealed a small abdominal aortic areas and, patent left iliac stent, 80% calcified ostial right common iliac artery stenosis with a 40 mm gradient and 50% segmental mid SFA disease bilaterally with three-vessel runoff. She complains of right hip and leg claudication. Because of the dissection I elected not to heparinize her and perform intervention but rather to stage this and let the dissection heal. She does have a palpable pedal pulse but I can not feel a good right femoral pulse. There is no bruit. I'm going to repeat her Doppler studies. I discussed the procedure with the patient and her daughter. She will need access using micropuncture technique and diamondback rotational arthrectomy of the ostium of her right common iliac artery as well as stenting.

## 2013-01-23 NOTE — Patient Instructions (Addendum)
Your physician has requested that you have a peripheral vascular angiogram. This exam is performed at the hospital. During this exam IV contrast is used to look at arterial blood flow. Please review the information sheet given for details. This will be scheduled in November. This will be right iliac diamond back. Right groin access. The rep to assist will be Minerva Areola.  Your physician has requested that you have a lower  extremity venous duplex. This test is an ultrasound of the veins in the legs. It looks at venous blood flow that carries blood from the heart to the legs. Allow one hour for a Lower Venous exam. Allow thirty minutes for an Upper Venous exam. There are no restrictions or special instructions.

## 2013-01-23 NOTE — Progress Notes (Signed)
01/23/2013 [name]   1930-02-29  [id number]  Primary Physician Albertina Senegal, MD Primary Cardiologist: Runell Gess MD [name]   HPI:  [name] is an 79 year old mildly overweight Caucasian female accompanied by her daughter today. She is a patient of Dr. Onalee Hua Hardings. She has a history of CAD status post stenting of her RCA in the past. Her last cardiac catheterization performed 11/26/08 by Dr. Jacinto Halim and revealed an occluded RCA stent with moderate AV groove circumflex disease and left to right collaterals. She does get short of breath but denies chest pain. There is a history of COPD. Her other problems include hypertension and hyperlipidemia. She did have a left common iliac artery stent performed by Dr. Jacinto Halim 12/29/05 her left hip claudication. She now complains of right hip claudication. I performed angiography on 01/08/13 was complicated by small subintimal dissection in the right common femoral and external iliac artery. The right common iliac artery ostium was calcified 80% stenosis with a 40 mm gradient. The left common iliac artery stent was patent and. There is a small abdominal aortic angers them. I elected to stage her intervention because of the dissection. She still complains of right leg discomfort. I'm going to repeat the lower chin or chill Doppler study to further characterize her right common femoral artery and make sure there is no change in her anatomy prior to scheduling the intermittent intervention Will which will require diamondback orbital rotational atherectomy.    Current Outpatient Prescriptions  Medication Sig Dispense Refill  . ALPRAZolam (XANAX) 0.5 MG tablet Take 0.5 mg by mouth daily as needed for anxiety.      [name] aspirin EC 81 MG tablet Take 81 mg by mouth daily.      [name] atorvastatin (LIPITOR) 40 MG tablet Take 40 mg by mouth daily.      . cholecalciferol (VITAMIN D) 1000 UNITS tablet Take 1,000 Units by mouth daily.      . fish  oil-omega-3 fatty acids 1000 MG capsule Take by mouth daily.      [name] glucosamine-chondroitin 500-400 MG tablet Take 1 tablet by mouth 2 (two) times daily.      . isosorbide mononitrate (IMDUR) 30 MG 24 hr tablet Take 30 mg by mouth daily.      [name] levothyroxine (SYNTHROID, LEVOTHROID) 88 MCG tablet Take 88 mcg by mouth daily.      [name] omeprazole (PRILOSEC) 20 MG capsule Take 1 capsule (20 mg total) by mouth daily.  90 capsule  0  . sertraline (ZOLOFT) 25 MG tablet Take 25 mg by mouth daily.      . trandolapril (MAVIK) 4 MG tablet Take 2 mg by mouth daily.      . vitamin E (VITAMIN E) 400 UNIT capsule Take 400 Units by mouth daily.      . nitroGLYCERIN (NITROSTAT) 0.4 MG SL tablet Place 1 tablet (0.4 mg total) under the tongue every 5 (five) minutes as needed for chest pain.  25 tablet  3  . nitroGLYCERIN (NITROSTAT) 0.4 MG SL tablet Place 1 tablet (0.4 mg total) under the tongue every 5 (five) minutes as needed for chest pain.  90 tablet  3   No current facility-administered medications for this visit.    Allergies  Allergen Reactions  . Avastin [Bevacizumab]   . Beta Adrenergic Blockers Other (See Comments)    fatigue  . Other     statin    History   Social History  . Marital  Status: Widowed    Spouse Name: N/A    Number of Children: N/A  . Years of Education: N/A   Occupational History  . Not on file.   Social History Main Topics  . Smoking status: Former Smoker -- 1.00 packs/day for 40 years    Types: Cigarettes    Quit date: 09/18/1987  . Smokeless tobacco: Never Used  . Alcohol Use: No  . Drug Use: No  . Sexual Activity: Not Currently   Other Topics Concern  . Not on file   Social History Narrative   She is a widowed mother of 4, grandmother of 6. She'd like to try to exercise at least 2 times a week doing elliptical trainer about 20 minutes at a time. But she's noticed that she's not able to do as much as she used to mostly due to the hip and buttock pain.      Review of Systems: General: negative for chills, fever, night sweats or weight changes.  Cardiovascular: negative for chest pain, dyspnea on exertion, edema, orthopnea, palpitations, paroxysmal nocturnal dyspnea or shortness of breath Dermatological: negative for rash Respiratory: negative for cough or wheezing Urologic: negative for hematuria Abdominal: negative for nausea, vomiting, diarrhea, bright red blood per rectum, melena, or hematemesis Neurologic: negative for visual changes, syncope, or dizziness All other systems reviewed and are otherwise negative except as noted above.    Blood pressure 150/80, pulse 70, height 5' 4.5" (1.638 m), weight 149 lb 4.8 oz (67.722 kg).  General appearance: alert and no distress Neck: no adenopathy, no carotid bruit, no JVD, supple, symmetrical, trachea midline and thyroid not enlarged, symmetric, no tenderness/mass/nodules Lungs: clear to auscultation bilaterally Heart: regular rate and rhythm, S1, S2 normal, no murmur, click, rub or gallop Extremities: extremities normal, atraumatic, no cyanosis or edema Pulses: 2+ and symmetric  EKG not performed today  ASSESSMENT AND PLAN:   PAD (peripheral artery disease) Patient underwent abdominal aortography with bifemoral runoff 01/06/13 to the right femoral approach. This is coupled to a by a small subintimal dissection secondary to the wire. The results revealed a small abdominal aortic areas and, patent left iliac stent, 80% calcified ostial right common iliac artery stenosis with a 40 mm gradient and 50% segmental mid SFA disease bilaterally with three-vessel runoff. She complains of right hip and leg claudication. Because of the dissection I elected not to heparinize her and perform intervention but rather to stage this and let the dissection heal. She does have a palpable pedal pulse but I can not feel a good right femoral pulse. There is no bruit. I'm going to repeat her Doppler studies. I discussed  the procedure with the patient and her daughter. She will need access using micropuncture technique and diamondback rotational arthrectomy of the ostium of her right common iliac artery as well as stenting.      Runell Gess MD FACP,FACC,FAHA, [facility] 01/23/2013 4:28 PM

## 2013-01-24 ENCOUNTER — Other Ambulatory Visit: Payer: Self-pay | Admitting: *Deleted

## 2013-01-24 ENCOUNTER — Encounter: Payer: Self-pay | Admitting: Cardiovascular Disease

## 2013-01-24 DIAGNOSIS — Z01818 Encounter for other preprocedural examination: Secondary | ICD-10-CM

## 2013-01-30 ENCOUNTER — Ambulatory Visit (HOSPITAL_COMMUNITY)
Admission: RE | Admit: 2013-01-30 | Discharge: 2013-01-30 | Disposition: A | Discharge status: Home / Self Care | Payer: Medicare Other | Source: Ambulatory Visit | Attending: Cardiology | Admitting: Cardiology

## 2013-01-30 ENCOUNTER — Other Ambulatory Visit: Payer: Self-pay | Admitting: *Deleted

## 2013-01-30 DIAGNOSIS — Z01818 Encounter for other preprocedural examination: Secondary | ICD-10-CM

## 2013-01-30 DIAGNOSIS — I2510 Atherosclerotic heart disease of native coronary artery without angina pectoris: Secondary | ICD-10-CM | POA: Insufficient documentation

## 2013-01-30 DIAGNOSIS — I70219 Atherosclerosis of native arteries of extremities with intermittent claudication, unspecified extremity: Secondary | ICD-10-CM

## 2013-01-30 DIAGNOSIS — I739 Peripheral vascular disease, unspecified: Secondary | ICD-10-CM

## 2013-01-30 NOTE — Progress Notes (Signed)
Arterial Lower Ext. Duplex Completed. Havanah Nelms, BS, RDMS, RVT  

## 2013-02-06 LAB — CBC
HCT: 38.5 % (ref 36.0–46.0)
Hemoglobin: 13.1 g/dL (ref 12.0–15.0)
MCHC: 34 g/dL (ref 30.0–36.0)
Platelets: 152 10*3/uL (ref 150–400)
RBC: 3.9 MIL/uL (ref 3.87–5.11)
RDW: 14.2 % (ref 11.5–15.5)
WBC: 4.4 10*3/uL (ref 4.0–10.5)

## 2013-02-06 LAB — BASIC METABOLIC PANEL
BUN: 18 mg/dL (ref 6–23)
Chloride: 107 mEq/L (ref 96–112)
Glucose, Bld: 92 mg/dL (ref 70–99)
Potassium: 4.7 mEq/L (ref 3.5–5.3)
Sodium: 144 mEq/L (ref 135–145)

## 2013-02-06 LAB — PROTIME-INR: INR: 1.07 (ref <1.50)

## 2013-02-13 ENCOUNTER — Encounter (HOSPITAL_COMMUNITY)
Admission: RE | Disposition: A | Discharge status: Home / Self Care | Payer: Self-pay | Source: Ambulatory Visit | Attending: Cardiovascular Disease

## 2013-02-13 ENCOUNTER — Encounter (HOSPITAL_COMMUNITY): Payer: Self-pay | Admitting: General Practice

## 2013-02-13 ENCOUNTER — Ambulatory Visit (HOSPITAL_COMMUNITY)
Admission: RE | Admit: 2013-02-13 | Discharge: 2013-02-14 | Disposition: A | Discharge status: Home / Self Care | Payer: Medicare Other | Source: Ambulatory Visit | Attending: Cardiovascular Disease | Admitting: Cardiovascular Disease

## 2013-02-13 DIAGNOSIS — I739 Peripheral vascular disease, unspecified: Secondary | ICD-10-CM

## 2013-02-13 DIAGNOSIS — I5032 Chronic diastolic (congestive) heart failure: Secondary | ICD-10-CM

## 2013-02-13 DIAGNOSIS — Z01818 Encounter for other preprocedural examination: Secondary | ICD-10-CM

## 2013-02-13 DIAGNOSIS — J4489 Other specified chronic obstructive pulmonary disease: Secondary | ICD-10-CM | POA: Insufficient documentation

## 2013-02-13 DIAGNOSIS — I10 Essential (primary) hypertension: Secondary | ICD-10-CM | POA: Insufficient documentation

## 2013-02-13 DIAGNOSIS — E039 Hypothyroidism, unspecified: Secondary | ICD-10-CM | POA: Insufficient documentation

## 2013-02-13 DIAGNOSIS — K219 Gastro-esophageal reflux disease without esophagitis: Secondary | ICD-10-CM | POA: Insufficient documentation

## 2013-02-13 DIAGNOSIS — I714 Abdominal aortic aneurysm, without rupture, unspecified: Secondary | ICD-10-CM | POA: Insufficient documentation

## 2013-02-13 DIAGNOSIS — I2510 Atherosclerotic heart disease of native coronary artery without angina pectoris: Secondary | ICD-10-CM | POA: Insufficient documentation

## 2013-02-13 DIAGNOSIS — E785 Hyperlipidemia, unspecified: Secondary | ICD-10-CM | POA: Diagnosis present

## 2013-02-13 DIAGNOSIS — I70219 Atherosclerosis of native arteries of extremities with intermittent claudication, unspecified extremity: Secondary | ICD-10-CM | POA: Insufficient documentation

## 2013-02-13 DIAGNOSIS — E663 Overweight: Secondary | ICD-10-CM | POA: Insufficient documentation

## 2013-02-13 DIAGNOSIS — J449 Chronic obstructive pulmonary disease, unspecified: Secondary | ICD-10-CM | POA: Insufficient documentation

## 2013-02-13 HISTORY — DX: Headache, unspecified: R51.9

## 2013-02-13 HISTORY — DX: Headache: R51

## 2013-02-13 HISTORY — DX: Anxiety disorder, unspecified: F41.9

## 2013-02-13 HISTORY — PX: ILIAC ARTERY STENT: SHX1786

## 2013-02-13 HISTORY — DX: Other specified postprocedural states: R11.2

## 2013-02-13 HISTORY — DX: Other specified postprocedural states: Z98.890

## 2013-02-13 LAB — POCT ACTIVATED CLOTTING TIME: Activated Clotting Time: 201 seconds

## 2013-02-13 SURGERY — ATHERECTOMY PERIPHERAL ARTERY
Laterality: Right

## 2013-02-13 MED ORDER — DIAZEPAM 5 MG PO TABS: 5.0000 mg | ORAL_TABLET | ORAL | Status: DC

## 2013-02-13 MED ORDER — HYDRALAZINE HCL 20 MG/ML IJ SOLN
10.0000 mg | INTRAMUSCULAR | Status: DC | PRN
  Administered 2013-02-13: 10 mg via INTRAVENOUS
  Filled 2013-02-13 (×2): qty 1

## 2013-02-13 MED ORDER — SODIUM CHLORIDE 0.9 % IJ SOLN: 3.0000 mL | INTRAMUSCULAR | Status: DC | PRN

## 2013-02-13 MED ORDER — SODIUM CHLORIDE 0.9 % IV SOLN
INTRAVENOUS | Status: DC
  Administered 2013-02-13: 10:00:00 via INTRAVENOUS

## 2013-02-13 MED ORDER — ATORVASTATIN CALCIUM 40 MG PO TABS
40.0000 mg | ORAL_TABLET | Freq: Every day | ORAL | Status: DC
  Administered 2013-02-13: 40 mg via ORAL
  Filled 2013-02-13 (×2): qty 1

## 2013-02-13 MED ORDER — FENTANYL CITRATE 0.05 MG/ML IJ SOLN
INTRAMUSCULAR | Status: AC
  Filled 2013-02-13: qty 2

## 2013-02-13 MED ORDER — MORPHINE SULFATE 2 MG/ML IJ SOLN
1.0000 mg | INTRAMUSCULAR | Status: DC | PRN
  Administered 2013-02-13 (×3): 1 mg via INTRAVENOUS
  Filled 2013-02-13 (×3): qty 1

## 2013-02-13 MED ORDER — ONDANSETRON HCL 4 MG/2ML IJ SOLN
4.0000 mg | Freq: Once | INTRAMUSCULAR | Status: AC
  Administered 2013-02-13: 4 mg via INTRAVENOUS

## 2013-02-13 MED ORDER — ONDANSETRON HCL 4 MG/2ML IJ SOLN
4.0000 mg | Freq: Four times a day (QID) | INTRAMUSCULAR | Status: DC | PRN
  Administered 2013-02-13 – 2013-02-14 (×2): 4 mg via INTRAVENOUS
  Filled 2013-02-13 (×3): qty 2

## 2013-02-13 MED ORDER — HEPARIN (PORCINE) IN NACL 2-0.9 UNIT/ML-% IJ SOLN
INTRAMUSCULAR | Status: AC
  Filled 2013-02-13: qty 1000

## 2013-02-13 MED ORDER — HYDRALAZINE HCL 20 MG/ML IJ SOLN
10.0000 mg | INTRAMUSCULAR | Status: DC | PRN
  Administered 2013-02-13: 10 mg via INTRAVENOUS

## 2013-02-13 MED ORDER — ASPIRIN 81 MG PO CHEW: 81.0000 mg | CHEWABLE_TABLET | ORAL | Status: DC

## 2013-02-13 MED ORDER — ALPRAZOLAM 0.5 MG PO TABS
0.5000 mg | ORAL_TABLET | Freq: Every day | ORAL | Status: DC | PRN
  Administered 2013-02-13: 18:00:00 0.5 mg via ORAL
  Filled 2013-02-13: qty 1

## 2013-02-13 MED ORDER — HEPARIN SODIUM (PORCINE) 1000 UNIT/ML IJ SOLN
INTRAMUSCULAR | Status: AC
  Filled 2013-02-13: qty 1

## 2013-02-13 MED ORDER — ACETAMINOPHEN 325 MG PO TABS: 650.0000 mg | ORAL_TABLET | ORAL | Status: DC | PRN

## 2013-02-13 MED ORDER — LIDOCAINE HCL (PF) 1 % IJ SOLN
INTRAMUSCULAR | Status: AC
  Filled 2013-02-13: qty 30

## 2013-02-13 MED ORDER — HYDRALAZINE HCL 20 MG/ML IJ SOLN
INTRAMUSCULAR | Status: AC
  Filled 2013-02-13: qty 1

## 2013-02-13 MED ORDER — NITROGLYCERIN 0.4 MG SL SUBL: 0.4000 mg | SUBLINGUAL_TABLET | SUBLINGUAL | Status: DC | PRN

## 2013-02-13 MED ORDER — LEVOTHYROXINE SODIUM 88 MCG PO TABS
88.0000 ug | ORAL_TABLET | Freq: Every day | ORAL | Status: DC
  Administered 2013-02-14: 06:00:00 88 ug via ORAL
  Filled 2013-02-13 (×2): qty 1

## 2013-02-13 MED ORDER — PANTOPRAZOLE SODIUM 40 MG PO TBEC
40.0000 mg | DELAYED_RELEASE_TABLET | Freq: Every day | ORAL | Status: DC
  Administered 2013-02-13 – 2013-02-14 (×2): 40 mg via ORAL
  Filled 2013-02-13 (×2): qty 1

## 2013-02-13 MED ORDER — VERAPAMIL HCL 2.5 MG/ML IV SOLN
INTRAVENOUS | Status: AC
  Filled 2013-02-13: qty 2

## 2013-02-13 MED ORDER — SERTRALINE HCL 25 MG PO TABS
25.0000 mg | ORAL_TABLET | Freq: Every day | ORAL | Status: DC
  Filled 2013-02-13: qty 1

## 2013-02-13 MED ORDER — NITROGLYCERIN IN D5W 200-5 MCG/ML-% IV SOLN
INTRAVENOUS | Status: AC
  Filled 2013-02-13: qty 250

## 2013-02-13 MED ORDER — CLOPIDOGREL BISULFATE 75 MG PO TABS
300.0000 mg | ORAL_TABLET | Freq: Once | ORAL | Status: AC
  Administered 2013-02-13: 300 mg via ORAL
  Filled 2013-02-13: qty 4

## 2013-02-13 MED ORDER — SODIUM CHLORIDE 0.9 % IV SOLN
INTRAVENOUS | Status: AC
  Administered 2013-02-13: 75 mL/h via INTRAVENOUS

## 2013-02-13 MED ORDER — DIAZEPAM 5 MG PO TABS
ORAL_TABLET | ORAL | Status: AC
  Administered 2013-02-13: 5 mg
  Filled 2013-02-13: qty 1

## 2013-02-13 MED ORDER — ASPIRIN EC 81 MG PO TBEC
81.0000 mg | DELAYED_RELEASE_TABLET | Freq: Every day | ORAL | Status: DC
  Filled 2013-02-13: qty 1

## 2013-02-13 MED ORDER — TRANDOLAPRIL 2 MG PO TABS
2.0000 mg | ORAL_TABLET | Freq: Every day | ORAL | Status: DC
  Filled 2013-02-13: qty 1

## 2013-02-13 MED ORDER — ASPIRIN 81 MG PO CHEW
CHEWABLE_TABLET | ORAL | Status: AC
  Administered 2013-02-13: 81 mg
  Filled 2013-02-13: qty 1

## 2013-02-13 MED ORDER — CLOPIDOGREL BISULFATE 75 MG PO TABS
75.0000 mg | ORAL_TABLET | Freq: Every day | ORAL | Status: DC
  Administered 2013-02-14: 09:00:00 75 mg via ORAL
  Filled 2013-02-13: qty 1

## 2013-02-13 MED ORDER — ISOSORBIDE MONONITRATE ER 30 MG PO TB24
30.0000 mg | ORAL_TABLET | Freq: Every day | ORAL | Status: DC
  Filled 2013-02-13: qty 1

## 2013-02-13 NOTE — Interval H&P Note (Signed)
History and Physical Interval Note:  02/13/2013 2:10 PM  [name]  has presented today for surgery, with the diagnosis of pvd  The various methods of treatment have been discussed with the patient and family. After consideration of risks, benefits and other options for treatment, the patient has consented to  Procedure(s): LOWER EXTREMITY ANGIOGRAM (N/A) as a surgical intervention .  The patient's history has been reviewed, patient examined, no change in status, stable for surgery.  I have reviewed the patient's chart and labs.  Questions were answered to the patient's satisfaction.     Runell Gess

## 2013-02-13 NOTE — H&P (View-Only) (Signed)
   01/23/2013 [name]   07/23/1929  [id number]  Primary Physician POLLOCK, NELSON, MD Primary Cardiologist: Jonathan J. Berry MD FACP,FACC,FAHA, FSCAI   HPI:  [name] is an 79-year-old mildly overweight Caucasian female accompanied by her daughter today. She is a patient of Dr. David Hardings. She has a history of CAD status post stenting of her RCA in the past. Her last cardiac catheterization performed 11/26/08 by Dr. Ganji and revealed an occluded RCA stent with moderate AV groove circumflex disease and left to right collaterals. She does get short of breath but denies chest pain. There is a history of COPD. Her other problems include hypertension and hyperlipidemia. She did have a left common iliac artery stent performed by Dr. Ganji 12/29/05 her left hip claudication. She now complains of right hip claudication. I performed angiography on 01/08/13 was complicated by small subintimal dissection in the right common femoral and external iliac artery. The right common iliac artery ostium was calcified 80% stenosis with a 40 mm gradient. The left common iliac artery stent was patent and. There is a small abdominal aortic angers them. I elected to stage her intervention because of the dissection. She still complains of right leg discomfort. I'm going to repeat the lower chin or chill Doppler study to further characterize her right common femoral artery and make sure there is no change in her anatomy prior to scheduling the intermittent intervention Will which will require diamondback orbital rotational atherectomy.    Current Outpatient Prescriptions  Medication Sig Dispense Refill  . ALPRAZolam (XANAX) 0.5 MG tablet Take 0.5 mg by mouth daily as needed for anxiety.      . aspirin EC 81 MG tablet Take 81 mg by mouth daily.      . atorvastatin (LIPITOR) 40 MG tablet Take 40 mg by mouth daily.      . cholecalciferol (VITAMIN D) 1000 UNITS tablet Take 1,000 Units by mouth daily.      . fish  oil-omega-3 fatty acids 1000 MG capsule Take by mouth daily.      . glucosamine-chondroitin 500-400 MG tablet Take 1 tablet by mouth 2 (two) times daily.      . isosorbide mononitrate (IMDUR) 30 MG 24 hr tablet Take 30 mg by mouth daily.      . levothyroxine (SYNTHROID, LEVOTHROID) 88 MCG tablet Take 88 mcg by mouth daily.      . omeprazole (PRILOSEC) 20 MG capsule Take 1 capsule (20 mg total) by mouth daily.  90 capsule  0  . sertraline (ZOLOFT) 25 MG tablet Take 25 mg by mouth daily.      . trandolapril (MAVIK) 4 MG tablet Take 2 mg by mouth daily.      . vitamin E (VITAMIN E) 400 UNIT capsule Take 400 Units by mouth daily.      . nitroGLYCERIN (NITROSTAT) 0.4 MG SL tablet Place 1 tablet (0.4 mg total) under the tongue every 5 (five) minutes as needed for chest pain.  25 tablet  3  . nitroGLYCERIN (NITROSTAT) 0.4 MG SL tablet Place 1 tablet (0.4 mg total) under the tongue every 5 (five) minutes as needed for chest pain.  90 tablet  3   No current facility-administered medications for this visit.    Allergies  Allergen Reactions  . Avastin [Bevacizumab]   . Beta Adrenergic Blockers Other (See Comments)    fatigue  . Other     statin    History   Social History  . Marital   Status: Widowed    Spouse Name: N/A    Number of Children: N/A  . Years of Education: N/A   Occupational History  . Not on file.   Social History Main Topics  . Smoking status: Former Smoker -- 1.00 packs/day for 40 years    Types: Cigarettes    Quit date: 09/18/1987  . Smokeless tobacco: Never Used  . Alcohol Use: No  . Drug Use: No  . Sexual Activity: Not Currently   Other Topics Concern  . Not on file   Social History Narrative   She is a widowed mother of 4, grandmother of 6. She'd like to try to exercise at least 2 times a week doing elliptical trainer about 20 minutes at a time. But she's noticed that she's not able to do as much as she used to mostly due to the hip and buttock pain.      Review of Systems: General: negative for chills, fever, night sweats or weight changes.  Cardiovascular: negative for chest pain, dyspnea on exertion, edema, orthopnea, palpitations, paroxysmal nocturnal dyspnea or shortness of breath Dermatological: negative for rash Respiratory: negative for cough or wheezing Urologic: negative for hematuria Abdominal: negative for nausea, vomiting, diarrhea, bright red blood per rectum, melena, or hematemesis Neurologic: negative for visual changes, syncope, or dizziness All other systems reviewed and are otherwise negative except as noted above.    Blood pressure 150/80, pulse 70, height 5' 4.5" (1.638 m), weight 149 lb 4.8 oz (67.722 kg).  General appearance: alert and no distress Neck: no adenopathy, no carotid bruit, no JVD, supple, symmetrical, trachea midline and thyroid not enlarged, symmetric, no tenderness/mass/nodules Lungs: clear to auscultation bilaterally Heart: regular rate and rhythm, S1, S2 normal, no murmur, click, rub or gallop Extremities: extremities normal, atraumatic, no cyanosis or edema Pulses: 2+ and symmetric  EKG not performed today  ASSESSMENT AND PLAN:   PAD (peripheral artery disease) Patient underwent abdominal aortography with bifemoral runoff 01/06/13 to the right femoral approach. This is coupled to a by a small subintimal dissection secondary to the wire. The results revealed a small abdominal aortic areas and, patent left iliac stent, 80% calcified ostial right common iliac artery stenosis with a 40 mm gradient and 50% segmental mid SFA disease bilaterally with three-vessel runoff. She complains of right hip and leg claudication. Because of the dissection I elected not to heparinize her and perform intervention but rather to stage this and let the dissection heal. She does have a palpable pedal pulse but I can not feel a good right femoral pulse. There is no bruit. I'm going to repeat her Doppler studies. I discussed  the procedure with the patient and her daughter. She will need access using micropuncture technique and diamondback rotational arthrectomy of the ostium of her right common iliac artery as well as stenting.      Jonathan J. Berry MD FACP,FACC,FAHA, FSCAI 01/23/2013 4:28 PM  

## 2013-02-13 NOTE — Progress Notes (Signed)
Site area: right groin  Site Prior to Removal:  Level 0  Pressure Applied For 25 MINUTES    Minutes Beginning at 20:02  Manual:   yes  Patient Status During Pull: WNL  Post Pull Groin Site:  Level 0  Post Pull Instructions Given:  yes  Post Pull Pulses Present:  yes  Dressing Applied:  yes  Comments:  Pt is very anxious & vomited during pulled sheath. During vomiting pt brady HR=35 but quickly back to normal. Given Zofran 4 mg IV as ordered. V/S stable.  Will continue to monitor pt.

## 2013-02-13 NOTE — CV Procedure (Signed)
[name] is a 79 y.o. female    [id number] LOCATION:  FACILITY: [facility]  PHYSICIAN: Nanetta Batty, M.D. 1929/12/05   DATE OF PROCEDURE:  02/13/2013  DATE OF DISCHARGE:     PV Angiogram/Intervention    History obtained from chart review.[name] is an 79 year old mildly overweight Caucasian female accompanied by her daughter today. She is a patient of Dr. Onalee Hua Hardings. She has a history of CAD status post stenting of her RCA in the past. Her last cardiac catheterization performed 11/26/08 by Dr. Jacinto Halim and revealed an occluded RCA stent with moderate AV groove circumflex disease and left to right collaterals. She does get short of breath but denies chest pain. There is a history of COPD. Her other problems include hypertension and hyperlipidemia. She did have a left common iliac artery stent performed by Dr. Jacinto Halim 12/29/05 her left hip claudication. She now complains of right hip claudication. I performed angiography on 01/08/13 was complicated by small subintimal dissection in the right common femoral and external iliac artery. The right common iliac artery ostium was calcified 80% stenosis with a 40 mm gradient. The left common iliac artery stent was patent and. There is a small abdominal aortic angers them. I elected to stage her intervention because of the dissection. She still complains of right leg discomfort. I'm going to repeat the lower chin or chill Doppler study to further characterize her right common femoral artery and make sure there is no change in her anatomy prior to scheduling the intermittent intervention Will which will require diamondback orbital rotational atherectomy.    PROCEDURE DESCRIPTION:   The patient was brought to the second floor [facility] cath lab in the postabsorptive state. She was not Premedicated.  Her right groinwas prepped and shaved in usual sterile fashion. Xylocaine 1% was used for local anesthesia. A 6 French Bright tip sheath was inserted  into the right common femoral artery using standard Seldinger technique. The patient received 5000 units  of heparin  intravenously.      HEMODYNAMICS:    AO SYSTOLIC/AO DIASTOLIC: 134/57   Angiographic Data:an abdominal aortogram was performed with a 5 French pigtail catheter.the lesion was crossed with a Versicore wire and 5 French endhole catheter. There was a wire exchanged for an 014 Viper wire.orbital with his left reckoning was performed with a diamondback 2 mm bur up to 120,000 RPM. Following this stenting was performed with a 7 mm x 19 mm long balloon expandable stent resulting in reduction of a 90% ostial calcified right common iliac artery stenosis to 0% residual. Completion aortogram was performed.  IMPRESSION:successful diamondback orbital rotation lipectomy, PTA and stenting of highly calcified ostial right common iliac artery stenosis for lifestyle limiting claudication. The sheath will be removed once ACT falls below 170. The patient will be treated with aspirin and Plavix. She will be hydrated overnight, discharged home in the morning. Followup Dopplers will be obtained. She will then see me back.     Runell Gess MD, [facility] 02/13/2013 3:19 PM

## 2013-02-14 ENCOUNTER — Other Ambulatory Visit: Payer: Self-pay | Admitting: Cardiology

## 2013-02-14 DIAGNOSIS — I5032 Chronic diastolic (congestive) heart failure: Secondary | ICD-10-CM

## 2013-02-14 DIAGNOSIS — I739 Peripheral vascular disease, unspecified: Secondary | ICD-10-CM

## 2013-02-14 DIAGNOSIS — I714 Abdominal aortic aneurysm, without rupture: Secondary | ICD-10-CM

## 2013-02-14 DIAGNOSIS — I10 Essential (primary) hypertension: Secondary | ICD-10-CM

## 2013-02-14 DIAGNOSIS — Z9861 Coronary angioplasty status: Secondary | ICD-10-CM

## 2013-02-14 DIAGNOSIS — I2510 Atherosclerotic heart disease of native coronary artery without angina pectoris: Secondary | ICD-10-CM

## 2013-02-14 LAB — BASIC METABOLIC PANEL
BUN: 14 mg/dL (ref 6–23)
CO2: 21 mEq/L (ref 19–32)
Chloride: 102 mEq/L (ref 96–112)
Creatinine, Ser: 0.6 mg/dL (ref 0.50–1.10)
GFR calc Af Amer: >90 mL/min (ref >90)
Potassium: 3.7 mEq/L (ref 3.5–5.1)

## 2013-02-14 LAB — CBC
HCT: 35.8 % — ABNORMAL LOW (ref 36.0–46.0)
Hemoglobin: 12.4 g/dL (ref 12.0–15.0)
MCHC: 34.6 g/dL (ref 30.0–36.0)
MCV: 98.6 fL (ref 78.0–100.0)
RDW: 13 % (ref 11.5–15.5)
WBC: 7.8 10*3/uL (ref 4.0–10.5)

## 2013-02-14 MED ORDER — PANTOPRAZOLE SODIUM 40 MG PO TBEC: 40.0000 mg | DELAYED_RELEASE_TABLET | Freq: Every day | ORAL | Status: DC

## 2013-02-14 MED ORDER — CLOPIDOGREL BISULFATE 75 MG PO TABS: 75.0000 mg | ORAL_TABLET | Freq: Every day | ORAL | Status: DC

## 2013-02-14 NOTE — Progress Notes (Signed)
Pt noted to have minimal bleeding at right femoral site after walking this AM. No hematoma or bruising present, only enough blood to saturate bandaid. MD (harding) informed, told to walk pt with gauze dressing again prior to discharge. Will monitor.   Delynn Flavin, RN

## 2013-02-14 NOTE — Progress Notes (Signed)
Utilization Review Completed.[name] T10/29/2014  

## 2013-02-14 NOTE — Discharge Summary (Signed)
Physician Discharge Summary  Patient ID: [name] MRN: [id number] DOB/AGE: 12-13-1929 79 y.o.  Admit date: 02/13/2013 Discharge date: 02/14/2013  Admission Diagnoses: PAD/Claudication  Discharge Diagnoses:  Principal Problem:   PAD - s/p diamondback orbital rotation atherectomy, PTA + stenting of calcified ostical right CIA- 02/13/13 Active Problems:   Claudication in peripheral vascular disease   HYPOTHYROIDISM   HYPERLIPIDEMIA   HYPERTENSION   Abdominal aortic aneurysm   CAD S/P percutaneous coronary angioplasty   GERD (gastroesophageal reflux disease)   Discharged Condition: stable  Hospital Course: The patient is a 79 y/o female, followed by Dr. Herbie Baltimore, with a history of CAD, s/p stenting of her RCA remotely, HTN, HLD, COPD, as well as PVD. She was referred to Dr. Allyson Sabal for evaluation of right hip claudication. She underwent PV angio initially on 01/08/13 and was found to have 80% stenosis of the right common iliac artery with a 40 mm gradient. She returned to [facility] on 02/13/13 for planned intervention. The procedure was performed by Dr. Allyson Sabal. She underwent successful diamondback orbital rotation atherectomy, PTA and stenting of the highly calcified ostial right common iliac artery stenosis. She tolerated the procedure well. She left the cath lab in stable condition. She was started on ASA and Plavix. She was kept overnight for observation and hydration. She had no post-cath complications. The right femoral access site remained stable. She had no leg or groin pain and no difficulty ambulating. Her renal function remained stable. She was last seen and examined by Dr. Herbie Baltimore, who determined she was stable for discharge. She will have f/u LEAs done as an OP. She will f/u with Dr. Allyson Sabal.     Consults: None  Significant Diagnostic Studies:   PV Angio + Intervention of right CIA HEMODYNAMICS:  AO SYSTOLIC/AO DIASTOLIC: 134/57  Angiographic Data:an abdominal aortogram was  performed with a 5 French pigtail catheter.the lesion was crossed with a Versicore wire and 5 French endhole catheter. There was a wire exchanged for an 014 Viper wire.orbital with his left reckoning was performed with a diamondback 2 mm bur up to 120,000 RPM. Following this stenting was performed with a 7 mm x 19 mm long balloon expandable stent resulting in reduction of a 90% ostial calcified right common iliac artery stenosis to 0% residual.     Treatments: See Hospital Course  Discharge Exam: Blood pressure 147/62, pulse 83, temperature 98.1 F (36.7 C), temperature source Oral, resp. rate 18, height 5' 4.5" (1.638 m), weight 149 lb 4 oz (67.7 kg), SpO2 94.00%.   Disposition: 01-Home or Self Care      Discharge Orders   Future Appointments Provider Department Dept Phone   03/13/2013 10:45 AM Runell Gess, MD [facility] [phone]   Future Orders Complete By Expires   Diet - low sodium heart healthy  As directed    Driving Restrictions  As directed    Comments:     No driving for 3 days   Increase activity slowly  As directed    Lifting restrictions  As directed    Comments:     No heavy lifting for 3 days       Medication List         ALPRAZolam 0.5 MG tablet  Commonly known as:  XANAX  Take 0.5 mg by mouth daily as needed for anxiety.     aspirin EC 81 MG tablet  Take 81 mg by mouth daily.     atorvastatin 40 MG tablet  Commonly  known as:  LIPITOR  Take 40 mg by mouth daily.     cholecalciferol 1000 UNITS tablet  Commonly known as:  VITAMIN D  Take 1,000 Units by mouth daily.     clopidogrel 75 MG tablet  Commonly known as:  PLAVIX  Take 1 tablet (75 mg total) by mouth daily with breakfast.     fish oil-omega-3 fatty acids 1000 MG capsule  Take 1 g by mouth daily.     glucosamine-chondroitin 500-400 MG tablet  Take 1 tablet by mouth 2 (two) times daily.     isosorbide mononitrate 30 MG 24 hr tablet  Commonly known as:  IMDUR  Take  30 mg by mouth daily.     levothyroxine 88 MCG tablet  Commonly known as:  SYNTHROID, LEVOTHROID  Take 88 mcg by mouth daily.     nitroGLYCERIN 0.4 MG SL tablet  Commonly known as:  NITROSTAT  Place 1 tablet (0.4 mg total) under the tongue every 5 (five) minutes as needed for chest pain.     pantoprazole 40 MG tablet  Commonly known as:  PROTONIX  Take 1 tablet (40 mg total) by mouth daily.     sertraline 25 MG tablet  Commonly known as:  ZOLOFT  Take 25 mg by mouth daily.     trandolapril 4 MG tablet  Commonly known as:  MAVIK  Take 2 mg by mouth daily.     vitamin E 400 UNIT capsule  Generic drug:  vitamin E  Take 400 Units by mouth daily.       Follow-up Information   Follow up with Runell Gess, MD On 03/13/2013. (10:45 am)    Specialty:  Cardiology   Contact information:   [address] [place] [address] [phone]       Follow up with [facility]. (our office will call you with an appointment for your ultrasound)    Contact information:   [phone]     TIME SPENT ON DISCHARGE, INCLUDING PHYSICIAN TIME: >30 MINUTES  Signed: Allayne Butcher, PA-C 02/14/2013, 2:30 PM

## 2013-02-14 NOTE — Progress Notes (Signed)
Pt discharged by volunteer wheelchair. Daughter driving pt home, no other belongings left behind. Follow up appts and new Rx gone over with pt and family. No questions at this time.    Delynn Flavin, RN

## 2013-02-14 NOTE — Progress Notes (Signed)
Subjective: No complaints. She denies right groin, flank or back pain. No leg pain. She has not walked yet.   Objective: Vital signs in last 24 hours: Temp:  [97.7 F (36.5 C)-98.4 F (36.9 C)] 98.1 F (36.7 C) (10/29 9604) Pulse Rate:  [52-90] 83 (10/29 0812) Resp:  [15-20] 18 (10/29 0812) BP: (133-199)/(51-89) 147/62 mmHg (10/29 0812) SpO2:  [94 %-99 %] 94 % (10/29 0812) Weight:  [149 lb 4 oz (67.7 kg)] 149 lb 4 oz (67.7 kg) (10/29 0001) Last BM Date: 02/12/13  Intake/Output from previous day: 10/28 0701 - 10/29 0700 In: 412.5 [I.V.:412.5] Out: 1152 [Urine:1150; Emesis/NG output:2] Intake/Output this shift: Total I/O In: 240 [P.O.:240] Out: -   Medications Current Facility-Administered Medications  Medication Dose Route Frequency Provider Last Rate Last Dose  . acetaminophen (TYLENOL) tablet 650 mg  650 mg Oral Q4H PRN Runell Gess, MD      . ALPRAZolam Prudy Feeler) tablet 0.5 mg  0.5 mg Oral Daily PRN Runell Gess, MD   0.5 mg at 02/13/13 1757  . aspirin EC tablet 81 mg  81 mg Oral Daily Runell Gess, MD      . atorvastatin (LIPITOR) tablet 40 mg  40 mg Oral q1800 Runell Gess, MD   40 mg at 02/13/13 2154  . clopidogrel (PLAVIX) tablet 75 mg  75 mg Oral Q breakfast Runell Gess, MD   75 mg at 02/14/13 0858  . hydrALAZINE (APRESOLINE) injection 10 mg  10 mg Intravenous Q2H PRN Abelino Derrick, PA-C   10 mg at 02/13/13 1945  . isosorbide mononitrate (IMDUR) 24 hr tablet 30 mg  30 mg Oral Daily Runell Gess, MD      . levothyroxine (SYNTHROID, LEVOTHROID) tablet 88 mcg  88 mcg Oral QAC breakfast Runell Gess, MD   88 mcg at 02/14/13 0601  . morphine 2 MG/ML injection 1 mg  1 mg Intravenous Q1H PRN Runell Gess, MD   1 mg at 02/13/13 1954  . nitroGLYCERIN (NITROSTAT) SL tablet 0.4 mg  0.4 mg Sublingual Q5 min PRN Runell Gess, MD      . ondansetron [facility]) injection 4 mg  4 mg Intravenous Q6H PRN Runell Gess, MD   4 mg at 02/14/13 0153  .  pantoprazole (PROTONIX) EC tablet 40 mg  40 mg Oral Daily Abelino Derrick, PA-C   40 mg at 02/14/13 0858  . sertraline (ZOLOFT) tablet 25 mg  25 mg Oral Daily Runell Gess, MD      . trandolapril (MAVIK) tablet 2 mg  2 mg Oral Daily Runell Gess, MD        PE: General appearance: alert, cooperative and no distress Lungs: clear to auscultation bilaterally Heart: regular rate and rhythm, S1, S2 normal, no murmur, click, rub or gallop Extremities: on LEE, right groin: no ecchymosis, no hematoma, non tender, faint bruit (old) Pulses: 2+ and symmetric Skin: warm and dry Neurologic: Grossly normal  Lab Results:   Recent Labs  02/14/13 0341  WBC 7.8  HGB 12.4  HCT 35.8*  PLT 140*   BMET  Recent Labs  02/14/13 0341  NA 134*  K 3.7  CL 102  CO2 21  GLUCOSE 133*  BUN 14  CREATININE 0.60  CALCIUM 9.1   Studies/Results:  PV Angio/Intervention IMPRESSION:successful diamondback orbital rotation lipectomy, PTA and stenting of highly calcified ostial right common iliac artery stenosis for lifestyle limiting claudication.    Assessment/Plan  Principal Problem:   PAD - s/p diamondback orbital rotation atherectomy, PTA + stenting of calcified ostical right CIA- 02/13/13 Active Problems:   Claudication in peripheral vascular disease   HYPOTHYROIDISM   HYPERLIPIDEMIA   HYPERTENSION   Abdominal aortic aneurysm   CAD S/P percutaneous coronary angioplasty   GERD (gastroesophageal reflux disease)  Plan: Day 1 s/p successful diamondback orbital rotation atherectomy, PTA and stenting of a highly calcified ostial right common iliac artery stenosis for lifestyle limiting claudication. She is on ASA and Plavix. No lower extremity pain. Right femoral access site is stable. Renal function stable. Will have pt ambulate today. If ok will d/c home today. Will arrange OP LEA and f/u with Dr. Allyson Sabal.    LOS: 1 day   Brittainy M. Delmer Islam 02/14/2013 9:24 AM  I have seen and  evaluated the patient this AM along with Boyce Medici, PA. I agree with her findings, examination as well as impression recommendations.  Pt is well known to me (my clinic patient referred to Dr. Allyson Sabal).  No s/p Atherectomy - Stent of RCIA for hip claudication.  Feels fine this AM & is ready to walk.  Will need to be on Plavix for at least 72month - will need Dr. Hazle Coca final advice re length of Rx.. She did not seem to tolerate Plavix last time.   She is ready for d/c - needs OP LEA Dopplers & ROV with Dr. Allyson Sabal, then can return to my care.   Marykay Lex, M.D., M.S. [facility] [address] [place], [place]  [address]  [phone] Pager # [phone] 02/14/2013 9:51 AM

## 2013-02-14 NOTE — Discharge Summary (Signed)
Stable post RCIA PTA-Stent.   Mild track ooze from cath site, but ok for d/c.  ROV as scheduled.  Agree with d/c summary.  Marykay Lex, MD

## 2013-02-14 NOTE — Progress Notes (Signed)
Pt walked 358ft with new dressing on with no s/s bleeding, clean dry intact. Site level 0 with no hematoma/bruising. MD Herbie Baltimore) notified, given OK to discharge pt. Educated regarding s/s bleeding complications. Verbalized understanding.   Delynn Flavin, RN

## 2013-02-22 ENCOUNTER — Telehealth (HOSPITAL_COMMUNITY): Payer: Self-pay | Admitting: *Deleted

## 2013-02-27 ENCOUNTER — Telehealth (HOSPITAL_COMMUNITY): Payer: Self-pay | Admitting: *Deleted

## 2013-02-27 NOTE — Telephone Encounter (Signed)
Pt called me back in response to the message left regarding scheduling the LEA ordered by [place] on 10/29. Pt already had the doppler done on 10/14. She would like to know why she needs to have it done again.[name] Please Advise.

## 2013-02-27 NOTE — Telephone Encounter (Signed)
This doppler is post procedure. I called patient and explained the process.

## 2013-03-06 ENCOUNTER — Telehealth (HOSPITAL_COMMUNITY): Payer: Self-pay | Admitting: *Deleted

## 2013-03-08 ENCOUNTER — Telehealth (HOSPITAL_COMMUNITY): Payer: Self-pay | Admitting: *Deleted

## 2013-03-12 ENCOUNTER — Ambulatory Visit (HOSPITAL_COMMUNITY)
Admission: RE | Admit: 2013-03-12 | Discharge: 2013-03-12 | Disposition: A | Discharge status: Home / Self Care | Payer: Medicare Other | Source: Ambulatory Visit | Attending: Cardiology | Admitting: Cardiology

## 2013-03-12 DIAGNOSIS — Z9889 Other specified postprocedural states: Secondary | ICD-10-CM | POA: Insufficient documentation

## 2013-03-12 DIAGNOSIS — I70209 Unspecified atherosclerosis of native arteries of extremities, unspecified extremity: Secondary | ICD-10-CM | POA: Insufficient documentation

## 2013-03-12 DIAGNOSIS — I739 Peripheral vascular disease, unspecified: Secondary | ICD-10-CM

## 2013-03-12 DIAGNOSIS — I70219 Atherosclerosis of native arteries of extremities with intermittent claudication, unspecified extremity: Secondary | ICD-10-CM

## 2013-03-12 NOTE — Progress Notes (Signed)
Right Lower Ext. Arterial Duplex Completed. [name], BS, RDMS, RVT  

## 2013-03-13 ENCOUNTER — Ambulatory Visit (INDEPENDENT_AMBULATORY_CARE_PROVIDER_SITE_OTHER): Payer: Medicare Other | Admitting: Cardiovascular Disease

## 2013-03-13 ENCOUNTER — Encounter: Payer: Self-pay | Admitting: Cardiovascular Disease

## 2013-03-13 VITALS — BP 142/82 | HR 74 | Ht 64.5 in | Wt 148.0 lb

## 2013-03-13 DIAGNOSIS — I739 Peripheral vascular disease, unspecified: Secondary | ICD-10-CM

## 2013-03-13 NOTE — Assessment & Plan Note (Signed)
A. Status post diamondback orbital rotational atherectomy, PT and stenting of high-grade ostial right common iliac artery stenosis. She does have a small abdominal aortic aneurysm. Her left iliac was stented by Dr. Jacinto Halim 9/12/7. Her hip discomfort has completely resolved and her Dopplers performed 03/12/13 showed normal right ABI and velocities.

## 2013-03-13 NOTE — Progress Notes (Signed)
03/13/2013 [name]   1929-11-09  [id number]  Primary Physician Terry Senegal, MD Primary Cardiologist: Terry Gess MD [name]   HPI:  [name] is an 79 year old mildly overweight Caucasian female accompanied by her daughter today. She is a patient of Dr. Onalee Hua [name]. She has a history of CAD status post stenting of her RCA in the past. Her last cardiac catheterization performed 11/26/08 by Dr. Jacinto [name] and revealed an occluded RCA stent with moderate AV groove circumflex disease and left to right collaterals. She does get short of breath but denies chest pain. There is a history of COPD. Her other problems include hypertension and hyperlipidemia. She did have a left common iliac artery stent performed by Dr. Jacinto [name] 12/29/05 her left hip claudication. She now complains of right hip claudication. I performed angiography on 01/08/13 was complicated by small subintimal dissection in the right common femoral and external iliac artery. The right common iliac artery ostium was calcified 80% stenosis with a 40 mm gradient. The left common iliac artery stent was patent and. There is a small abdominal aortic angers them. I elected to stage her intervention because of the dissection. She still complains of right leg discomfort. I'm going to repeat the lower extremity arterial  Doppler study to further characterize her right common femoral artery and make sure there is no change in her anatomy prior to scheduling the intermittent intervention  The patient underwent diamondback orbital or additional atherectomy, PT and stenting/28/14 with excellent angiographic and clinical result. The followup arterial Doppler studies performed 03/12/13 were entirely normal. Her claudication has completely resolved.   Current Outpatient Prescriptions  Medication Sig Dispense Refill  . ALPRAZolam (XANAX) 0.5 MG tablet Take 0.5 mg by mouth daily as needed for anxiety.      [name] aspirin EC 81 MG tablet  Take 81 mg by mouth daily.      [name] atorvastatin (LIPITOR) 40 MG tablet Take 40 mg by mouth daily.      . cholecalciferol (VITAMIN D) 1000 UNITS tablet Take 1,000 Units by mouth daily.      . clopidogrel (PLAVIX) 75 MG tablet Take 1 tablet (75 mg total) by mouth daily with breakfast.  30 tablet  11  . fish oil-omega-3 fatty acids 1000 MG capsule Take 1 g by mouth daily.       [name] glucosamine-chondroitin 500-400 MG tablet Take 1 tablet by mouth 2 (two) times daily.      . isosorbide mononitrate (IMDUR) 30 MG 24 hr tablet Take 30 mg by mouth daily.      [name] levothyroxine (SYNTHROID, LEVOTHROID) 88 MCG tablet Take 88 mcg by mouth daily.      . nitroGLYCERIN (NITROSTAT) 0.4 MG SL tablet Place 1 tablet (0.4 mg total) under the tongue every 5 (five) minutes as needed for chest pain.  90 tablet  3  . pantoprazole (PROTONIX) 40 MG tablet Take 1 tablet (40 mg total) by mouth daily.  30 tablet  5  . sertraline (ZOLOFT) 25 MG tablet Take 25 mg by mouth daily.      . trandolapril (MAVIK) 4 MG tablet Take 2 mg by mouth daily.      . vitamin E (VITAMIN E) 400 UNIT capsule Take 400 Units by mouth daily.       No current facility-administered medications for this visit.    Allergies  Allergen Reactions  . Avastin [Bevacizumab]     Muscle pain  . Beta Adrenergic Blockers Other (See  Comments)    fatigue  . Other     Statin-muscle pain    History   Social History  . Marital Status: Widowed    Spouse Name: N/A    Number of Children: N/A  . Years of Education: N/A   Occupational History  . Not on file.   Social History Main Topics  . Smoking status: Former Smoker -- 1.00 packs/day for 40 years    Types: Cigarettes    Quit date: 09/18/1987  . Smokeless tobacco: Never Used  . Alcohol Use: No  . Drug Use: No  . Sexual Activity: Not Currently   Other Topics Concern  . Not on file   Social History Narrative   She is a widowed mother of 4, grandmother of 6. She'd like to try to exercise at least 2  times a week doing elliptical trainer about 20 minutes at a time. But she's noticed that she's not able to do as much as she used to mostly due to the hip and buttock pain.     Review of Systems: General: negative for chills, fever, night sweats or weight changes.  Cardiovascular: negative for chest pain, dyspnea on exertion, edema, orthopnea, palpitations, paroxysmal nocturnal dyspnea or shortness of breath Dermatological: negative for rash Respiratory: negative for cough or wheezing Urologic: negative for hematuria Abdominal: negative for nausea, vomiting, diarrhea, bright red blood per rectum, melena, or hematemesis Neurologic: negative for visual changes, syncope, or dizziness All other systems reviewed and are otherwise negative except as noted above.    Blood pressure 142/82, pulse 74, height 5' 4.5" (1.638 m), weight 148 lb (67.132 kg).  General appearance: alert and no distress Neck: no adenopathy, no carotid bruit, no JVD, supple, symmetrical, trachea midline and thyroid not enlarged, symmetric, no tenderness/mass/nodules Lungs: clear to auscultation bilaterally Heart: regular rate and rhythm, S1, S2 normal, no murmur, click, rub or gallop Extremities: extremities normal, atraumatic, no cyanosis or edema Pulses: 2+ and symmetric  EKG not performed today  ASSESSMENT AND PLAN:   PAD - s/p diamondback orbital rotation atherectomy, PTA + stenting of calcified ostical right CIA- 02/13/13 A. Status post diamondback orbital rotational atherectomy, PT and stenting of high-grade ostial right common iliac artery stenosis. She does have a small abdominal aortic aneurysm. Her left iliac was stented by Dr. Jacinto [name] 9/12/7. Her hip discomfort has completely resolved and her Dopplers performed 03/12/13 showed normal right ABI and velocities.      Terry Gess MD FACP,FACC,FAHA, [facility] 03/13/2013 11:41 AM

## 2013-03-13 NOTE — Patient Instructions (Addendum)
  We will see you back in follow up as needed with Dr Allyson Sabal. You should keep the routine office visit with Dr Herbie Baltimore.   Dr Allyson Sabal has ordered lower extremity arterial doppler every 6 months

## 2013-03-20 ENCOUNTER — Telehealth: Payer: Self-pay | Admitting: *Deleted

## 2013-03-20 NOTE — Telephone Encounter (Signed)
Message copied by Marella Bile on Tue Mar 20, 2013  4:56 PM ------      Message from: Runell Gess      Created: Sun Mar 18, 2013  5:56 PM       Improved dopplers s/p RCIA stenting. Repeat 6 months ------

## 2013-04-03 ENCOUNTER — Telehealth: Payer: Self-pay | Admitting: Cardiovascular Disease

## 2013-04-03 MED ORDER — CLOPIDOGREL BISULFATE 75 MG PO TABS: 75.0000 mg | ORAL_TABLET | Freq: Every day | ORAL | Status: DC

## 2013-04-03 NOTE — Telephone Encounter (Signed)
Need new prescription sent ito [facility]-[id number] for Clopidogrel 75 mg #90 and refills.

## 2013-04-03 NOTE — Telephone Encounter (Signed)
Refills sent to pharmacy. 

## 2013-04-09 ENCOUNTER — Other Ambulatory Visit: Payer: Self-pay | Admitting: *Deleted

## 2013-04-09 MED ORDER — TRANDOLAPRIL 4 MG PO TABS: 2.0000 mg | ORAL_TABLET | Freq: Every day | ORAL | Status: DC

## 2013-04-09 NOTE — Telephone Encounter (Signed)
Rx was sent to pharmacy electronically. 

## 2013-04-16 ENCOUNTER — Other Ambulatory Visit: Payer: Self-pay | Admitting: *Deleted

## 2013-04-16 MED ORDER — TRANDOLAPRIL 4 MG PO TABS: 2.0000 mg | ORAL_TABLET | Freq: Every day | ORAL | Status: DC

## 2013-05-01 ENCOUNTER — Telehealth: Payer: Self-pay | Admitting: Cardiology

## 2013-05-01 NOTE — Telephone Encounter (Signed)
Returned call and pt verified x 2 w/ pt's daughter, [name].  Stated pt was on a blood thinner previously and had problems w/ it.  Stated she was changed to another blood thinner and is having similar problems.  Stated it's hard to explain, but pt describes it as "not feeling well."  Stated pt is not sick, but doesn't feel well.  Stated pt is lethargic and is about like she was when she was on the blood thinner before.  Stated it may have been Plavix then too, not sure.    Daughter informed provider will be notified.  Verbalized understanding and agreed w/ plan.  Message forwarded to K. Donne Hazel, RN to discuss w/ Dr. Gwenlyn Found.  Paper chart# [id number] on cart.

## 2013-05-01 NOTE — Telephone Encounter (Signed)
Please call-having problem with her new blood thinner. She was not sure of the name of it,did not have the bottle with her,

## 2013-05-02 NOTE — Telephone Encounter (Signed)
I spoke with Dr Gwenlyn Found about this.  She had extensive PV work in Oct.  Really too soon to stop the Plavix.  Lmom to get more info about the situation.

## 2013-05-03 NOTE — Telephone Encounter (Signed)
lmom 

## 2013-05-03 NOTE — Telephone Encounter (Signed)
I spoke with [name]. [name] just kind of feels lethargic, but she has felt better the past two days.  I explained that the plavix doesn't usually cause those types of symptoms, but we don't want her to feel that way.  I will talk to Dr Gwenlyn Found and find out what is the earliest date that she can stop her plavix.  In the meantime, if her symptoms change or worsen, [name] will call back ASAP.

## 2013-05-03 NOTE — Telephone Encounter (Signed)
Returning call to [employer]

## 2013-05-03 NOTE — Telephone Encounter (Signed)
Returning [place] call

## 2013-05-07 NOTE — Telephone Encounter (Signed)
Lm for [facility] with the information that Dr Gwenlyn Found gave. I advised if she didn't feel comfortable with that or if any symptoms developed to call back ASAP

## 2013-05-07 NOTE — Telephone Encounter (Signed)
Dr Gwenlyn Found reviewed the chart Friday, 05/05/15.  He would like her to stay on the Plavix at least until August 14, 2013. That will be 6 months post procedure.

## 2013-05-08 ENCOUNTER — Telehealth: Payer: Self-pay | Admitting: Cardiovascular Disease

## 2013-05-08 ENCOUNTER — Telehealth: Payer: Self-pay | Admitting: Cardiology

## 2013-05-08 NOTE — Telephone Encounter (Signed)
Returned call and pt verified x 2.  Pt stated she thought she was taking 4 mg of her BP pill (trandolapril).  Pt stated she was taking that dose up to a week ago when the bottle said to take 1/2 tab.  Pt stated BP had been running 120s/60s-70s and since starting the half dose it has been 150s/70s.  Stated BP today was 158/78 at the doctor's office.  Pt informed Shannon at Dr. Brenton Grills office did call today to verify her dose on the trandolapril and was informed our records have 2 mg, which is a half of the 4 mg tablet.  Pt stated she understands that, but doesn't remember it being changed.    RN reviewed records and it appears it was changed at discharge from the hospital on 10.29.14.  Pt informed of this and stated she had still been taking 4 mg until a week ago.  Per pt, she is followed by Dr. Ellyn Hack and has seen Dr. Gwenlyn Found for PAD.  Informed Dr. Ellyn Hack will be notified for further instructions.  Pt verbalized understanding and agreed w/ plan.  In the meantime, pt advised to keep a a daily log of BPs for at least one week and call back w/ results.    Dr. Ellyn Hack - Please advise if pt should increase trandolapril back to 4 mg or wait until she calls back w/ the BP log to advise.  Thanks. ~ [employer]

## 2013-05-08 NOTE — Telephone Encounter (Signed)
Please call-question about her Trandolapril.

## 2013-05-08 NOTE — Telephone Encounter (Signed)
Please call-need clarification her medication.

## 2013-05-08 NOTE — Telephone Encounter (Signed)
Go back to 4mg .    Leonie Man, MD

## 2013-05-08 NOTE — Telephone Encounter (Signed)
Returned call and spoke w/ Larene Beach. Reviewed med list w/ her to her satisfaction.  Stated pt and daughter were in today and couldn't agree on dose of trandolapril.  Informed pt should be taking half of a 4 mg tab daily to equal 2 mg.  Verbalized understanding.

## 2013-05-10 MED ORDER — TRANDOLAPRIL 4 MG PO TABS: 4.0000 mg | ORAL_TABLET | Freq: Every day | ORAL | Status: DC

## 2013-05-10 NOTE — Telephone Encounter (Signed)
Returned call.  Left message on pt-identified voicemail that Dr. Ellyn Hack gave okay to go back to 4mg  of trandolapril and Rx will be sent to [facility].  Also to call back if questions.

## 2013-05-10 NOTE — Telephone Encounter (Signed)
Refills sent to pharmacy. 

## 2013-05-23 ENCOUNTER — Telehealth: Payer: Self-pay | Admitting: *Deleted

## 2013-05-23 MED ORDER — ISOSORBIDE MONONITRATE ER 30 MG PO TB24: 30.0000 mg | ORAL_TABLET | Freq: Every day | ORAL | Status: DC

## 2013-05-23 NOTE — Telephone Encounter (Signed)
Rx was sent to pharmacy electronically. 

## 2013-08-23 ENCOUNTER — Telehealth (HOSPITAL_COMMUNITY): Payer: Self-pay | Admitting: *Deleted

## 2013-08-30 ENCOUNTER — Telehealth: Payer: Self-pay | Admitting: Cardiology

## 2013-08-30 ENCOUNTER — Other Ambulatory Visit: Payer: Self-pay | Admitting: *Deleted

## 2013-08-30 MED ORDER — PANTOPRAZOLE SODIUM 40 MG PO TBEC: 40.0000 mg | DELAYED_RELEASE_TABLET | Freq: Every day | ORAL | Status: DC

## 2013-08-30 NOTE — Telephone Encounter (Signed)
Patient needs refill on Omeprazople 20 mg # 90 1 daily  [place] phone [phone].  Please let patient know when completed.

## 2013-08-30 NOTE — Telephone Encounter (Signed)
Refill on Pantoprazole to [facility].  Patient notified and voiced understanding.

## 2013-08-31 ENCOUNTER — Telehealth (HOSPITAL_COMMUNITY): Payer: Self-pay | Admitting: *Deleted

## 2013-10-15 ENCOUNTER — Ambulatory Visit (INDEPENDENT_AMBULATORY_CARE_PROVIDER_SITE_OTHER): Payer: Medicare Other | Admitting: Cardiology

## 2013-10-15 ENCOUNTER — Encounter: Payer: Self-pay | Admitting: Cardiology

## 2013-10-15 ENCOUNTER — Ambulatory Visit: Payer: Medicare Other | Admitting: Cardiology

## 2013-10-15 VITALS — BP 128/80 | HR 74 | Ht 64.5 in | Wt 148.4 lb

## 2013-10-15 DIAGNOSIS — R5383 Other fatigue: Secondary | ICD-10-CM

## 2013-10-15 DIAGNOSIS — R0789 Other chest pain: Secondary | ICD-10-CM

## 2013-10-15 DIAGNOSIS — R0609 Other forms of dyspnea: Secondary | ICD-10-CM

## 2013-10-15 DIAGNOSIS — R0989 Other specified symptoms and signs involving the circulatory and respiratory systems: Secondary | ICD-10-CM

## 2013-10-15 DIAGNOSIS — R5381 Other malaise: Secondary | ICD-10-CM

## 2013-10-15 NOTE — Progress Notes (Signed)
Patient ID: [name], female   DOB: 09/09/1929, 80 y.o.   MRN: [id number]    10/15/2013 [name]   1929/10/13  [id number]  Primary Physicia Drake Leach, MD Primary Cardiologist: Dr. Ellyn Hack  HPI:  The patient is an 80 y/o female, followed by Dr. Ellyn Hack, with a known history of CAD. She is status post stenting of her RCA in the past. Her last cardiac catheterization was performed 11/26/2008 by Dr. Einar Gip which revealed an occluded RCA stent with moderate AV groove circumflex disease and left to right collaterals. Medical therapy was recommended at that time. She had a nuclear stress test in September 2013 that was low risk for ischemia. Her last 2-D echocardiogram was in 2006 which revealed normal left ventricular systolic function. She also has peripheral vascular disease, which is followed by Dr. Gwenlyn Found. She is status post diamondback orbital rotational atherectomy, PTA and stenting of a highly calcified ostial right common iliac stenosis on 02/13/2013.   She presents to clinic today with a complaint of persistent fatigue, weakness, dyspnea on exertion as well as intermittent substernal chest pressure, with no particular pattern. She states her symptoms have been ongoing for the last 6 months. Her chest pressure feels somewhat similar to her prior anginal symptoms, prior to undergoing stenting of her RCA. However, her discomfort is not as severe. It mainly occurs at rest and only last for several seconds and is often self-limiting. She does recall having to use sublingual nitroglycerin only once, and her pain was immediately relieved. However, she states that she has not had recurrent symptoms, severe enough, to require further use of sublingual nitroglycerin. The discomfort does not radiate. It is not worsened by physical activity. In fact, she states that she works out at [employer] 2 times a week, performing mild to moderate physical activity. She states that she exercises on the elliptical  for nearly 20-30 minutes at a time and also engages in light weight training activities. She denies any chest pain with these activities.  In regards to her persistent fatigue, weakness and dyspnea on exertion, she denies any abnormal bleeding including melena/hematochezia. She states that she was recently seen and evaluated by her PCP and had lab work done recently and reveals that her CBC was normal. She also has a history of hypothyroidism, treated with Synthroid. She states that her PCP also recently checked her thyroid function and she was told that this was normal as well. She denies associated dyspnea at rest, orthopnea, PND and lower extremity edema.    Current Outpatient Prescriptions  Medication Sig Dispense Refill  . ALPRAZolam (XANAX) 0.5 MG tablet Take 0.5 mg by mouth daily as needed for anxiety.      . clopidogrel (PLAVIX) 75 MG tablet Take 1 tablet (75 mg total) by mouth daily with breakfast.  90 tablet  2  . fish oil-omega-3 fatty acids 1000 MG capsule Take 1 g by mouth daily.       [name] gabapentin (NEURONTIN) 100 MG capsule Take 200 mg by mouth at bedtime.      [name] glucosamine-chondroitin 500-400 MG tablet Take 1 tablet by mouth 2 (two) times daily.      . isosorbide mononitrate (IMDUR) 30 MG 24 hr tablet Take 1 tablet (30 mg total) by mouth daily.  90 tablet  3  . levothyroxine (SYNTHROID, LEVOTHROID) 88 MCG tablet Take 88 mcg by mouth daily.      . nitroGLYCERIN (NITROSTAT) 0.4 MG SL tablet Place 1 tablet (0.4 mg  total) under the tongue every 5 (five) minutes as needed for chest pain.  90 tablet  3  . pantoprazole (PROTONIX) 40 MG tablet Take 1 tablet (40 mg total) by mouth daily.  90 tablet  1  . sertraline (ZOLOFT) 25 MG tablet Take 25 mg by mouth daily.      . trandolapril (MAVIK) 4 MG tablet Take 1 tablet (4 mg total) by mouth daily.  90 tablet  1  . vitamin E (VITAMIN E) 400 UNIT capsule Take 400 Units by mouth daily.       No current facility-administered medications for this  visit.    Allergies  Allergen Reactions  . Avastin [Bevacizumab]     Muscle pain  . Benadryl [Diphenhydramine]     Heart race  . Beta Adrenergic Blockers Other (See Comments)    fatigue  . Other     Statin-muscle pain    History   Social History  . Marital Status: Widowed    Spouse Name: N/A    Number of Children: N/A  . Years of Education: N/A   Occupational History  . Not on file.   Social History Main Topics  . Smoking status: Former Smoker -- 1.00 packs/day for 40 years    Types: Cigarettes    Quit date: 09/18/1987  . Smokeless tobacco: Never Used  . Alcohol Use: No  . Drug Use: No  . Sexual Activity: Not Currently   Other Topics Concern  . Not on file   Social History Narrative   She is a widowed mother of 22, grandmother of 35. She'd like to try to exercise at least 2 times a week doing elliptical trainer about 20 minutes at a time. But she's noticed that she's not able to do as much as she used to mostly due to the hip and buttock pain.     Review of Systems: General: negative for chills, fever, night sweats or weight changes.  Cardiovascular: positive for chest pain,  Positive fo dyspnea on exertion, negative edema, orthopnea, palpitations, paroxysmal nocturnal dyspnea or shortness of breath Dermatological: negative for rash Respiratory: negative for cough or wheezing Urologic: negative for hematuria Abdominal: negative for nausea, vomiting, diarrhea, bright red blood per rectum, melena, or hematemesis Neurologic: negative for visual changes, syncope, or dizziness All other systems reviewed and are otherwise negative except as noted above.    Blood pressure 128/80, pulse 74, height 5' 4.5" (1.638 m), weight 148 lb 6.4 oz (67.314 kg).  General appearance: alert, cooperative and no distress Neck: no carotid bruit and no JVD Lungs: clear to auscultation bilaterally Heart: regular rate and rhythm, S1, S2 normal, no murmur, click, rub or gallop Extremities:  no LEE Pulses: 2+ and symmetric Skin: warm and dry Neurologic: Grossly normal  EKG normal sinus rhythm. Heart rate 74 beats per minute. No ischemic abnormalities. Unchanged from prior EKG.  ASSESSMENT AND PLAN:   1. Chest pain: Mixed typical/atypical features. Her symptoms occur at rest and are somewhat similar to her prior angina, prior to undergoing stenting of her RCA, however her symptoms are less severe. Often short lived and self-limiting. Interestingly, she is able to engage in mild to moderate physical activity at the gym 2 days a week without exertional chest pain. Her last left heart catheterization was in 2008 which revealed occlusion of the previously placed RCA stent and moderate AV groove circumflex disease, however with left to right collaterals. Since then, she has been treated with medical therapy. She had a nuclear  stress test in September 2013 which was low risk for ischemia. She is reluctant to immediately undergo repeat nuclear stress testing. I feel that given the fact that she exercises regularly without exertional chest pain, it may be best to start with a 2-D echocardiogram to reassess systolic function, look for wall motion abnormalities and to assess valvular anatomy. If there are significant changes regarding systolic function/wall motion, compared to her prior 2-D echo in 2006 then we should proceed with repeat nuclear stress testing. For now, I have recommended that she continue with medical therapy, which includes Plavix and Imdur. She is currently on 30 mg of Imdur daily. I suggested that if she continues to have frequent episodes a chest discomfort, she can try increasing her Imdur to 45 mg daily, (11/2 tablets daily), while monitoring blood pressure closely. She states that she has sublingual nitroglycerin at home. We reviewed the proper use of nitroglycerin and when it is recommended to call 911.   2. Fatigue/weakness/dyspnea on exertion: Per the patient, she has  undergone recent laboratory work which included a CBC and TSH, by her PCP, both of which she reported were normal. We will order a 2-D echo to reassess systolic function, wall motion as well as valvular anatomy.  PLAN  We will check a 2-D echocardiogram. Continue medical therapy for CAD with a trial increase of her Imdur  to 45 mg daily and sublingual nitroglycerin as needed. Patient has been instructed to followup in 2 weeks for reassessment. If 2-D echocardiogram is abnormal or if patient continues to have frequent chest discomfort despite change in medical therapy, then we may consider repeat nuclear stress testing.  SIMMONS, BRITTAINYPA-C 10/15/2013 4:30 PM

## 2013-10-15 NOTE — Patient Instructions (Signed)
Your physician has requested that you have an echocardiogram. Echocardiography is a painless test that uses sound waves to create images of your heart. It provides your doctor with information about the size and shape of your heart and how well your heart's chambers and valves are working. This procedure takes approximately one hour. There are no restrictions for this procedure.  Brittainy has recommended making the following medication changes:  INCREASE Imdur to 45 mg daily - take 1 and 1/2 tablets daily  Please contact your PCP sometime this week to check on your labs. Check to see if a CBC and a TSH was ordered.  Your physician recommends that you schedule a follow-up appointment in 2 weeks with Brittainy or Dr Roni Bread.

## 2013-10-17 HISTORY — PX: TRANSTHORACIC ECHOCARDIOGRAM: SHX275

## 2013-10-29 ENCOUNTER — Ambulatory Visit (HOSPITAL_COMMUNITY)
Admission: RE | Admit: 2013-10-29 | Discharge: 2013-10-29 | Disposition: A | Discharge status: Home / Self Care | Payer: Medicare Other | Source: Ambulatory Visit | Attending: Cardiology | Admitting: Cardiology

## 2013-10-29 DIAGNOSIS — R0609 Other forms of dyspnea: Secondary | ICD-10-CM | POA: Insufficient documentation

## 2013-10-29 DIAGNOSIS — I369 Nonrheumatic tricuspid valve disorder, unspecified: Secondary | ICD-10-CM

## 2013-10-29 DIAGNOSIS — R5381 Other malaise: Secondary | ICD-10-CM

## 2013-10-29 DIAGNOSIS — R0789 Other chest pain: Secondary | ICD-10-CM | POA: Insufficient documentation

## 2013-10-29 DIAGNOSIS — R5383 Other fatigue: Secondary | ICD-10-CM

## 2013-10-29 DIAGNOSIS — R0989 Other specified symptoms and signs involving the circulatory and respiratory systems: Secondary | ICD-10-CM | POA: Insufficient documentation

## 2013-10-29 NOTE — Progress Notes (Signed)
2D Echo Performed 10/29/2013    [name], RCS

## 2013-11-07 ENCOUNTER — Encounter (HOSPITAL_COMMUNITY): Payer: Self-pay | Admitting: Pharmacy Technician

## 2013-11-07 ENCOUNTER — Other Ambulatory Visit: Payer: Self-pay | Admitting: *Deleted

## 2013-11-07 ENCOUNTER — Ambulatory Visit
Admission: RE | Admit: 2013-11-07 | Discharge: 2013-11-07 | Disposition: A | Discharge status: Home / Self Care | Payer: Medicare Other | Source: Ambulatory Visit | Attending: Cardiology | Admitting: Cardiology

## 2013-11-07 ENCOUNTER — Encounter: Payer: Self-pay | Admitting: Cardiology

## 2013-11-07 ENCOUNTER — Ambulatory Visit (INDEPENDENT_AMBULATORY_CARE_PROVIDER_SITE_OTHER): Payer: Medicare Other | Admitting: Cardiology

## 2013-11-07 VITALS — BP 160/80 | HR 79 | Ht 64.5 in | Wt 146.0 lb

## 2013-11-07 DIAGNOSIS — R5383 Other fatigue: Secondary | ICD-10-CM

## 2013-11-07 DIAGNOSIS — D689 Coagulation defect, unspecified: Secondary | ICD-10-CM

## 2013-11-07 DIAGNOSIS — Z79899 Other long term (current) drug therapy: Secondary | ICD-10-CM

## 2013-11-07 DIAGNOSIS — R5381 Other malaise: Secondary | ICD-10-CM

## 2013-11-07 DIAGNOSIS — R079 Chest pain, unspecified: Secondary | ICD-10-CM

## 2013-11-07 DIAGNOSIS — Z01818 Encounter for other preprocedural examination: Secondary | ICD-10-CM

## 2013-11-07 LAB — PROTIME-INR
INR: 1.06 (ref <1.50)
PROTHROMBIN TIME: 13.8 s (ref 11.6–15.2)

## 2013-11-07 LAB — CBC
HEMATOCRIT: 42 % (ref 36.0–46.0)
Hemoglobin: 14.4 g/dL (ref 12.0–15.0)
MCH: 33 pg (ref 26.0–34.0)
MCHC: 34.3 g/dL (ref 30.0–36.0)
MCV: 96.1 fL (ref 78.0–100.0)
Platelets: 186 10*3/uL (ref 150–400)
RBC: 4.37 MIL/uL (ref 3.87–5.11)
RDW: 13.8 % (ref 11.5–15.5)
WBC: 6.8 10*3/uL (ref 4.0–10.5)

## 2013-11-07 LAB — BASIC METABOLIC PANEL
BUN: 16 mg/dL (ref 6–23)
CO2: 22 mEq/L (ref 19–32)
Calcium: 9.2 mg/dL (ref 8.4–10.5)
Chloride: 104 mEq/L (ref 96–112)
Creat: 0.73 mg/dL (ref 0.50–1.10)
GLUCOSE: 112 mg/dL — AB (ref 70–99)
Potassium: 4.6 mEq/L (ref 3.5–5.3)
Sodium: 138 mEq/L (ref 135–145)

## 2013-11-07 LAB — APTT: aPTT: 33 seconds (ref 24–37)

## 2013-11-07 MED ORDER — CLOPIDOGREL BISULFATE 75 MG PO TABS: 75.0000 mg | ORAL_TABLET | Freq: Every day | ORAL | Status: DC

## 2013-11-07 NOTE — Telephone Encounter (Signed)
Rx refill sent to patient pharmacy   

## 2013-11-07 NOTE — Patient Instructions (Signed)
Your physician has requested that you have a cardiac catheterization. Cardiac catheterization is used to diagnose and/or treat various heart conditions. Doctors may recommend this procedure for a number of different reasons. The most common reason is to evaluate chest pain. Chest pain can be a symptom of coronary artery disease (CAD), and cardiac catheterization can show whether plaque is narrowing or blocking your heart's arteries. This procedure is also used to evaluate the valves, as well as measure the blood flow and oxygen levels in different parts of your heart. For further information please visit [email or web address]. Please follow instruction sheet, as given.  Your physician recommends that you return for lab work Today

## 2013-11-08 LAB — VITAMIN D 25 HYDROXY (VIT D DEFICIENCY, FRACTURES): Vit D, 25-Hydroxy: 50 ng/mL (ref 30–89)

## 2013-11-08 LAB — TSH: TSH: 0.853 u[IU]/mL (ref 0.350–4.500)

## 2013-11-09 ENCOUNTER — Encounter: Payer: Self-pay | Admitting: Cardiology

## 2013-11-09 NOTE — Progress Notes (Signed)
Patient ID: [name], female   DOB: January 31, 1930, 80 y.o.   MRN: [id number]    11/07/2013 [name]   10-10-1929  [id number]  Primary Physicia Drake Leach, MD Primary Cardiologist: Dr. Ellyn Hack.  HPI:  The patient is an 80 y/o female, followed by Dr. Ellyn Hack, with a known history of CAD. She is status post stenting of her RCA in the past. Her last cardiac catheterization was performed 11/27/2006 by Dr. Einar Gip which revealed an occluded RCA stent with moderate AV groove circumflex disease (70-80% stenosis) and left to right collaterals. Medical therapy was recommended at that time. She had a nuclear stress test in September 2013 that was low risk for ischemia. Her last 2-D echocardiogram was in 2006 which revealed normal left ventricular systolic function. She also has peripheral vascular disease, which is followed by Dr. Gwenlyn Found. She is status post diamondback orbital rotational atherectomy, PTA and stenting of a highly calcified ostial right common iliac stenosis on 02/13/2013.   She presents back to clinic today for follow-up. She is accompanied by her daughter. I evaluated her in clinic on 10/15/2013. At that time she presented with complaints of persistent fatigue, weakness, dyspnea on exertion as well as intermittent substernal chest pressure, with no particular pattern. She stated that her symptoms have been ongoing for the last 6 months. She has both typical and atypical symptoms. She has experienced chest pressure that feels somewhat similar to her prior anginal symptoms, prior to undergoing stenting of her RCA. However, these episodes have not been as severe. Her chest pressure mainly occurs at rest and only lasts for several minutes. It does not radiate. It is relieved with sublingual nitroglycerin. Interestingly, she also engages in regular physical activity and works out at the gym 2 times a week performing mild to moderate physical activity. She states that she exercises on the  elliptical for nearly 20 minutes at a time without exertional chest discomfort. At the same time, she notes that she has been slightly limited by these activities due to persistent fatigue, weakness and dyspnea on exertion.  At her last visit, I suggested that we have her undergo a nuclear stress test, however she did not want to do this as she has had bad experiences wit stress test in the past with undesirable symptoms. I convinced her to get a repeat 2-D echo to evaluate systolic function and to look for wall motion abnormalities.This was a normal study with an EF of 50-55%. Wall motion was normal; there were no regional wall motion abnormalities. Grade 1 diastolic dysfunction was mentioned. I also ordered a CBC, TSH, and vitamin D as further workup for her fatigue/decreased exercise tolerance. All lab values were within normal limits. I also had her increase her Imdur from 30 to 45 mg daily.  Today in clinic, she continues to note persistent fatigue and decreased exercise tolerance. She also reports that she had recurrent substernal chest pressure yesterday that occurred at rest. It was relieved with sublingual nitroglycerin. Today in clinic she is currently chest pain-free.    Current Outpatient Prescriptions  Medication Sig Dispense Refill  . ALPRAZolam (XANAX) 0.5 MG tablet Take 0.5 mg by mouth daily as needed for anxiety.      . clopidogrel (PLAVIX) 75 MG tablet Take 1 tablet (75 mg total) by mouth daily with breakfast.  90 tablet  2  . fish oil-omega-3 fatty acids 1000 MG capsule Take 1 g by mouth daily.       [name] gabapentin (NEURONTIN)  100 MG capsule Take 200 mg by mouth at bedtime.      [name] glucosamine-chondroitin 500-400 MG tablet Take 1 tablet by mouth 2 (two) times daily.      . isosorbide mononitrate (IMDUR) 30 MG 24 hr tablet Take 1 tablet (30 mg total) by mouth daily.  90 tablet  3  . levothyroxine (SYNTHROID, LEVOTHROID) 88 MCG tablet Take 88 mcg by mouth daily.      . nitroGLYCERIN  (NITROSTAT) 0.4 MG SL tablet Place 1 tablet (0.4 mg total) under the tongue every 5 (five) minutes as needed for chest pain.  90 tablet  3  . pantoprazole (PROTONIX) 40 MG tablet Take 1 tablet (40 mg total) by mouth daily.  90 tablet  1  . sertraline (ZOLOFT) 25 MG tablet Take 25 mg by mouth daily.      . trandolapril (MAVIK) 4 MG tablet Take 1 tablet (4 mg total) by mouth daily.  90 tablet  1  . vitamin E (VITAMIN E) 400 UNIT capsule Take 400 Units by mouth daily.       No current facility-administered medications for this visit.    Allergies  Allergen Reactions  . Avastin [Bevacizumab]     Muscle pain  . Benadryl [Diphenhydramine]     Heart race  . Beta Adrenergic Blockers Other (See Comments)    fatigue  . Other     Statin-muscle pain    History   Social History  . Marital Status: Widowed    Spouse Name: N/A    Number of Children: N/A  . Years of Education: N/A   Occupational History  . Not on file.   Social History Main Topics  . Smoking status: Former Smoker -- 1.00 packs/day for 40 years    Types: Cigarettes    Quit date: 09/18/1987  . Smokeless tobacco: Never Used  . Alcohol Use: No  . Drug Use: No  . Sexual Activity: Not Currently   Other Topics Concern  . Not on file   Social History Narrative   She is a widowed mother of 66, grandmother of 77. She'd like to try to exercise at least 2 times a week doing elliptical trainer about 20 minutes at a time. But she's noticed that she's not able to do as much as she used to mostly due to the hip and buttock pain.     Review of Systems: General: negative for chills, fever, night sweats or weight changes.  Cardiovascular: negative for chest pain, dyspnea on exertion, edema, orthopnea, palpitations, paroxysmal nocturnal dyspnea or shortness of breath Dermatological: negative for rash Respiratory: negative for cough or wheezing Urologic: negative for hematuria Abdominal: negative for nausea, vomiting, diarrhea, bright  red blood per rectum, melena, or hematemesis Neurologic: negative for visual changes, syncope, or dizziness All other systems reviewed and are otherwise negative except as noted above.    Blood pressure 160/80, pulse 79, height 5' 4.5" (1.638 m), weight 146 lb (66.225 kg).  General appearance: alert, cooperative and no distress Neck: no carotid bruit and no JVD Lungs: clear to auscultation bilaterally Heart: regular rate and rhythm, S1, S2 normal, no murmur, click, rub or gallop Extremities: no LEE Pulses: 2+ and symmetric Skin: warm and dry Neurologic: Grossly normal  EKG Not performed  ASSESSMENT AND PLAN:   1. Unstable angina: Patient has had recent resting substernal chest pressure relieved with sublingual nitroglycerin as well as persistent fatigue, dyspnea on exertion and decreased exercise tolerance. Systolic function is normal by echo and  recent laboratory work including a CBC, TSH, vitamin D and BMET have all been normal. Her last cardiac catheterization was in 2008 which at that time revealed moderate AV groove disease with 70-80% stenosis but with right to left collaterals. It was mentioned in the cath report that she may be considered for angioplasty if she continued to have symptoms. After further discussion with the patient, we have decided to proceed with repeat left heart catheterization to redefine her coronary anatomy.  PLAN  Plan for outpatient left heart catheterization to assess for ischemia, with potential for PCI.  Gordan Grell, BRITTAINYPA-C 11/07/2013 12:21 PM

## 2013-11-12 ENCOUNTER — Telehealth: Payer: Self-pay | Admitting: Cardiology

## 2013-11-12 NOTE — Telephone Encounter (Signed)
Dr. Ellyn Hack do you have patient's results Or does [place]?

## 2013-11-12 NOTE — Telephone Encounter (Signed)
Pt's daughter called again

## 2013-11-12 NOTE — Telephone Encounter (Signed)
Would like to know if her lab results are back from 10-31-13 please.

## 2013-11-12 NOTE — Telephone Encounter (Signed)
Informed patient's daughter labs ok. She voiced her understanding and says that they will see Dr. Ellyn Hack tomorrow for patient's scheduled cath.

## 2013-11-12 NOTE — Telephone Encounter (Signed)
Labs look pretty good - glucose is a bit high (but not fasting).  OK for cath.  Leonie Man, MD

## 2013-11-13 ENCOUNTER — Ambulatory Visit (HOSPITAL_COMMUNITY)
Admission: RE | Admit: 2013-11-13 | Discharge: 2013-11-13 | Disposition: A | Discharge status: Home / Self Care | Payer: Medicare Other | Source: Ambulatory Visit | Attending: Cardiology | Admitting: Cardiology

## 2013-11-13 ENCOUNTER — Encounter (HOSPITAL_COMMUNITY)
Admission: RE | Disposition: A | Discharge status: Home / Self Care | Payer: Self-pay | Source: Ambulatory Visit | Attending: Cardiology

## 2013-11-13 DIAGNOSIS — F411 Generalized anxiety disorder: Secondary | ICD-10-CM | POA: Diagnosis not present

## 2013-11-13 DIAGNOSIS — Y849 Medical procedure, unspecified as the cause of abnormal reaction of the patient, or of later complication, without mention of misadventure at the time of the procedure: Secondary | ICD-10-CM | POA: Diagnosis not present

## 2013-11-13 DIAGNOSIS — Z9861 Coronary angioplasty status: Secondary | ICD-10-CM | POA: Diagnosis not present

## 2013-11-13 DIAGNOSIS — I739 Peripheral vascular disease, unspecified: Secondary | ICD-10-CM | POA: Diagnosis not present

## 2013-11-13 DIAGNOSIS — I200 Unstable angina: Secondary | ICD-10-CM

## 2013-11-13 DIAGNOSIS — Z7902 Long term (current) use of antithrombotics/antiplatelets: Secondary | ICD-10-CM | POA: Insufficient documentation

## 2013-11-13 DIAGNOSIS — Z9889 Other specified postprocedural states: Secondary | ICD-10-CM | POA: Insufficient documentation

## 2013-11-13 DIAGNOSIS — I519 Heart disease, unspecified: Secondary | ICD-10-CM | POA: Insufficient documentation

## 2013-11-13 DIAGNOSIS — Z87891 Personal history of nicotine dependence: Secondary | ICD-10-CM | POA: Insufficient documentation

## 2013-11-13 DIAGNOSIS — I2510 Atherosclerotic heart disease of native coronary artery without angina pectoris: Secondary | ICD-10-CM

## 2013-11-13 DIAGNOSIS — I10 Essential (primary) hypertension: Secondary | ICD-10-CM

## 2013-11-13 HISTORY — PX: LEFT HEART CATHETERIZATION WITH CORONARY ANGIOGRAM: SHX5451

## 2013-11-13 SURGERY — LEFT HEART CATHETERIZATION WITH CORONARY ANGIOGRAM
Anesthesia: LOCAL

## 2013-11-13 MED ORDER — SODIUM CHLORIDE 0.9 % IV SOLN: 250.0000 mL | INTRAVENOUS | Status: DC | PRN

## 2013-11-13 MED ORDER — SODIUM CHLORIDE 0.9 % IV SOLN
INTRAVENOUS | Status: DC
  Administered 2013-11-13: 08:00:00 via INTRAVENOUS

## 2013-11-13 MED ORDER — HEPARIN (PORCINE) IN NACL 2-0.9 UNIT/ML-% IJ SOLN
INTRAMUSCULAR | Status: AC
  Filled 2013-11-13: qty 1000

## 2013-11-13 MED ORDER — AMLODIPINE BESYLATE 2.5 MG PO TABS: 2.5000 mg | ORAL_TABLET | Freq: Every day | ORAL | Status: DC

## 2013-11-13 MED ORDER — OXYCODONE HCL 5 MG PO TABS
ORAL_TABLET | ORAL | Status: AC
  Filled 2013-11-13: qty 1

## 2013-11-13 MED ORDER — ONDANSETRON HCL 4 MG/2ML IJ SOLN: 4.0000 mg | Freq: Four times a day (QID) | INTRAMUSCULAR | Status: DC | PRN

## 2013-11-13 MED ORDER — SODIUM CHLORIDE 0.9 % IV SOLN: 1.0000 mL/kg/h | INTRAVENOUS | Status: DC

## 2013-11-13 MED ORDER — ASPIRIN 81 MG PO CHEW
81.0000 mg | CHEWABLE_TABLET | ORAL | Status: AC
Start: 2013-11-13 — End: 2013-11-13
  Administered 2013-11-13: 81 mg via ORAL

## 2013-11-13 MED ORDER — VERAPAMIL HCL 2.5 MG/ML IV SOLN
INTRAVENOUS | Status: AC
  Filled 2013-11-13: qty 2

## 2013-11-13 MED ORDER — SODIUM CHLORIDE 0.9 % IJ SOLN: 3.0000 mL | Freq: Two times a day (BID) | INTRAMUSCULAR | Status: DC

## 2013-11-13 MED ORDER — SODIUM CHLORIDE 0.9 % IJ SOLN: 3.0000 mL | INTRAMUSCULAR | Status: DC | PRN

## 2013-11-13 MED ORDER — HEPARIN SODIUM (PORCINE) 1000 UNIT/ML IJ SOLN
INTRAMUSCULAR | Status: AC
  Filled 2013-11-13: qty 1

## 2013-11-13 MED ORDER — MIDAZOLAM HCL 2 MG/2ML IJ SOLN
INTRAMUSCULAR | Status: AC
  Filled 2013-11-13: qty 2

## 2013-11-13 MED ORDER — FENTANYL CITRATE 0.05 MG/ML IJ SOLN
INTRAMUSCULAR | Status: AC
  Filled 2013-11-13: qty 2

## 2013-11-13 MED ORDER — NITROGLYCERIN 1 MG/10 ML FOR IR/CATH LAB
INTRA_ARTERIAL | Status: AC
  Filled 2013-11-13: qty 10

## 2013-11-13 MED ORDER — ASPIRIN 81 MG PO CHEW
CHEWABLE_TABLET | ORAL | Status: AC
  Administered 2013-11-13: 81 mg via ORAL
  Filled 2013-11-13: qty 1

## 2013-11-13 MED ORDER — ACETAMINOPHEN 325 MG PO TABS: 650.0000 mg | ORAL_TABLET | ORAL | Status: DC | PRN

## 2013-11-13 MED ORDER — LIDOCAINE HCL (PF) 1 % IJ SOLN
INTRAMUSCULAR | Status: AC
  Filled 2013-11-13: qty 30

## 2013-11-13 NOTE — CV Procedure (Signed)
CARDIAC CATHETERIZATION REPORT  NAME:  [name]   MRN: [id number] DOB:  14-May-1929   ADMIT DATE: 11/13/2013 Procedure Date: 11/13/2013  INTERVENTIONAL CARDIOLOGIST: Leonie Man, M.D., MS PRIMARY CARE PROVIDER: Drake Leach, MD PRIMARY CARDIOLOGIST: Leonie Man, MD, MS  PATIENT:  [name] is a 80 y.o. female with a history of PCI to the RCA with 2 overlapping femoral stents in 2004 with cutting balloon PTCA of in-stent restenosis in 2006. Followup catheterizations in 2007 2008 showed subtotally occluded RCA stents with a lateral flow from the left system. There is also a moderate to severe roughly 70% early mid circumflex lesion. She has done well with medical therapy, but was seen by Ellen Henri, PA-C for symptoms concerning for resting angina with severe fatigue. She has had issues with stress test in the past and therefore was referred for cardiac catheterization for possible crescendo unstable angina  PRE-OPERATIVE DIAGNOSIS:    Crescendo Angina/Class III  Known CAD  PROCEDURES PERFORMED:    Left Heart Catheterization with Native Coronary Angiography  via Right Radial Artery   Left Ventriculography  PROCEDURE: The patient was brought to the [facility] in the fasting state and prepped and draped in the usual sterile fashion for Right Radial artery access. A modified Allen's test was performed on the right wrist demonstrating excellent collateral flow for radial access.   Sterile technique was used including antiseptics, cap, gloves, gown, hand hygiene, mask and sheet. Skin prep: Chlorhexidine.   Consent: Risks of procedure as well as the alternatives and risks of each were explained to the (patient/caregiver). Consent for procedure obtained.   Time Out: Verified patient identification, verified procedure, site/side was marked, verified correct patient position, special equipment/implants available,  medications/allergies/relevent history reviewed, required imaging and test results available. Performed.  Access:   Right Radial Artery: 6 Fr Sheath -  Seldinger Technique (Angiocath Micropuncture Kit)  Radial Cocktail - 10 mL; IV Heparin 3500 Units   Left Heart Catheterization: 5 Fr Catheters advanced or exchanged over a Long Exchange Safety J-wire;  JR 4 catheter advanced first.  Right Coronary Artery Cineangiography: JR 4 Catheter  leftCoronary Artery Cineangiography:  JL 3.5 Catheter   LV Hemodynamics (LV Gram):  Angled pigtail  Sheath removed in the  Cath Lab with TR band placement for nonocclusive hemostasis.  TR Band: 1020  Hours;  13 mL air  FINDINGS:  Hemodynamics:   Central Aortic Pressure / Mean:  158/75/100 mmHg  Left Ventricular Pressure / LVEDP:  148/8/11 mmHg  Left Ventriculography:  EF:  55-60 %  Wall Motion:  Mild inferior hypokinesis  Coronary Anatomy:  Dominance:  Right  Left Main:  Large-caliber vessel that bifurcates into the LAD and Circumflex. LAD: Normal caliber wraparound vessel There are 2 major small caliber diagonal branches. Beyond D2 there is a roughly 40% stenosis; otherwise minimal luminal irregularity  Left Circumflex:  Large-caliber somewhat tortuous vessel with a roughly 60% focal lesion prior to giving off a small AV groove branch. The best the vessel becomes a major lateral OM with several small branches. It branches distally with 2 small branches having roughly 60% "ostial "lesions. The proximal lesion is relatively stable improved in comparison to the last catheterization in 2008.   RCA:  Very small caliber vessel with an almost anterior takeoff. There is a very long-standing segment is diffusely diseased with minimal differences compared to the previous catheterization. The vessel does bifurcate distally he has a relatively in normal  but small caliber PDA and Right Posterior AV Groove Branch (RPAV). There are brisk Left to Right  Collaterals from the LAD septal perforators, wraparound LAD and the AV groove branch the circumflex.   Diffuse luminal irregularity is noted in the PDA and PL  When compared to previous catheterization films, there was no significant change in the RCA distribution I do with the native flow or the collateral flow. The moderate severe lesion in the circumflex x-ray could be more stabilized. No culprit lesion.  PATIENT DISPOSITION:    The patient was transferred to the PACU holding area in a hemodynamicaly stable, chest pain free condition.  The patient tolerated the procedure well, and there were no complications.  EBL:   < 10 ml  The patient was stable before, during, and after the procedure.  POST-OPERATIVE DIAGNOSIS:    Severe single-vessel disease involving extensive in-stent restenosis of the occlusion in the dominant RCA. There is brisk left to right collaterals but no change from previous  Relatively normal LV function with mildly elevated LVEDP.  PLAN OF CARE:  Discharge to home after post radial cath care.  Will start low-dose amlodipine for additional blood pressure control and afterload reduction as well as for possible antispasm effect.  I don't think that doing PCI on the long is a restenosis in the RCA is a viable option in the absence of severe exertional angina which she does not seem to be having. She has not done well with stents and this would be a long drug of the stent in 2 overlapping bare metal stents in a relatively small caliber vessel.  At this point off for medical therapy and for other reasons for fatigue. Use sublingual nitroglycerin for potential spasm related chest pain at rest. I did indicate that if she has to use one half doses of isosorbide mononitrate for 2-3 days following an episode requiring the use of nitroglycerin.  Followup with either myself or Ellen Henri, PA-C.   Leonie Man, M.D., M.S. [facility] [address] [place], [place]  [address]  [phone]  11/13/2013 10:00 AM

## 2013-11-13 NOTE — Discharge Instructions (Signed)
Radial Site Care °Refer to this sheet in the next few weeks. These instructions provide you with information on caring for yourself after your procedure. Your caregiver may also give you more specific instructions. Your treatment has been planned according to current medical practices, but problems sometimes occur. Call your caregiver if you have any problems or questions after your procedure. °HOME CARE INSTRUCTIONS °· You may shower the day after the procedure. Remove the bandage (dressing) and gently wash the site with plain soap and water. Gently pat the site dry. °· Do not apply powder or lotion to the site. °· Do not submerge the affected site in water for 3 to 5 days. °· Inspect the site at least twice daily. °· Do not flex or bend the affected arm for 24 hours. °· No lifting over 5 pounds (2.3 kg) for 5 days after your procedure. °· Do not drive home if you are discharged the same day of the procedure. Have someone else drive you. °· You may drive 24 hours after the procedure unless otherwise instructed by your caregiver. °· Do not operate machinery or power tools for 24 hours. °· A responsible adult should be with you for the first 24 hours after you arrive home. °What to expect: °· Any bruising will usually fade within 1 to 2 weeks. °· Blood that collects in the tissue (hematoma) may be painful to the touch. It should usually decrease in size and tenderness within 1 to 2 weeks. °SEEK IMMEDIATE MEDICAL CARE IF: °· You have unusual pain at the radial site. °· You have redness, warmth, swelling, or pain at the radial site. °· You have drainage (other than a small amount of blood on the dressing). °· You have chills. °· You have a fever or persistent symptoms for more than 72 hours. °· You have a fever and your symptoms suddenly get worse. °· Your arm becomes pale, cool, tingly, or numb. °· You have heavy bleeding from the site. Hold pressure on the site. °Document Released: 05/08/2010 Document Revised:  06/28/2011 Document Reviewed: 05/08/2010 °Exit[employer]® Patient Information ©2015 [employer]. This information is not intended to replace advice given to you by your health care provider. Make sure you discuss any questions you have with your health care provider. ° °

## 2013-11-13 NOTE — Interval H&P Note (Signed)
History and Physical Interval Note:  11/13/2013 9:08 AM  [name]  has presented today for surgery, with the diagnosis of Exertional CP & Dyspnea concerning for Unstable Angina.    The various methods of treatment have been discussed with the patient and family. After consideration of risks, benefits and other options for treatment, the patient has consented to  Procedure(s): LEFT HEART CATHETERIZATION WITH CORONARY ANGIOGRAM (N/A) as a surgical intervention .  The patient's history has been reviewed, patient examined, no change in status, stable for surgery.  I have reviewed the patient's chart and labs.  Questions were answered to the patient's satisfaction.     Bonnell Placzek W  Cath Lab Visit (complete for each Cath Lab visit)  Clinical Evaluation Leading to the Procedure:   ACS: No. Crescendo Angina.  Non-ACS:    Anginal Classification: CCS III - Crescendo Angina.  Anti-ischemic medical therapy: Maximal Therapy (2 or more classes of medications)  Non-Invasive Test Results: No non-invasive testing performed  Prior CABG: No previous CABG

## 2013-11-13 NOTE — H&P (View-Only) (Signed)
Patient ID: [name], female   DOB: 12-28-1929, 80 y.o.   MRN: [id number]    11/07/2013 [name]   June 22, 1929  [id number]  Primary Physicia Drake Leach, MD Primary Cardiologist: Dr. Ellyn Hack.  HPI:  The patient is an 80 y/o female, followed by Dr. Ellyn Hack, with a known history of CAD. She is status post stenting of her RCA in the past. Her last cardiac catheterization was performed 11/27/2006 by Dr. Einar Gip which revealed an occluded RCA stent with moderate AV groove circumflex disease (70-80% stenosis) and left to right collaterals. Medical therapy was recommended at that time. She had a nuclear stress test in September 2013 that was low risk for ischemia. Her last 2-D echocardiogram was in 2006 which revealed normal left ventricular systolic function. She also has peripheral vascular disease, which is followed by Dr. Gwenlyn Found. She is status post diamondback orbital rotational atherectomy, PTA and stenting of a highly calcified ostial right common iliac stenosis on 02/13/2013.   She presents back to clinic today for follow-up. She is accompanied by her daughter. I evaluated her in clinic on 10/15/2013. At that time she presented with complaints of persistent fatigue, weakness, dyspnea on exertion as well as intermittent substernal chest pressure, with no particular pattern. She stated that her symptoms have been ongoing for the last 6 months. She has both typical and atypical symptoms. She has experienced chest pressure that feels somewhat similar to her prior anginal symptoms, prior to undergoing stenting of her RCA. However, these episodes have not been as severe. Her chest pressure mainly occurs at rest and only lasts for several minutes. It does not radiate. It is relieved with sublingual nitroglycerin. Interestingly, she also engages in regular physical activity and works out at the gym 2 times a week performing mild to moderate physical activity. She states that she exercises on the  elliptical for nearly 20 minutes at a time without exertional chest discomfort. At the same time, she notes that she has been slightly limited by these activities due to persistent fatigue, weakness and dyspnea on exertion.  At her last visit, I suggested that we have her undergo a nuclear stress test, however she did not want to do this as she has had bad experiences wit stress test in the past with undesirable symptoms. I convinced her to get a repeat 2-D echo to evaluate systolic function and to look for wall motion abnormalities.This was a normal study with an EF of 50-55%. Wall motion was normal; there were no regional wall motion abnormalities. Grade 1 diastolic dysfunction was mentioned. I also ordered a CBC, TSH, and vitamin D as further workup for her fatigue/decreased exercise tolerance. All lab values were within normal limits. I also had her increase her Imdur from 30 to 45 mg daily.  Today in clinic, she continues to note persistent fatigue and decreased exercise tolerance. She also reports that she had recurrent substernal chest pressure yesterday that occurred at rest. It was relieved with sublingual nitroglycerin. Today in clinic she is currently chest pain-free.    Current Outpatient Prescriptions  Medication Sig Dispense Refill  . ALPRAZolam (XANAX) 0.5 MG tablet Take 0.5 mg by mouth daily as needed for anxiety.      . clopidogrel (PLAVIX) 75 MG tablet Take 1 tablet (75 mg total) by mouth daily with breakfast.  90 tablet  2  . fish oil-omega-3 fatty acids 1000 MG capsule Take 1 g by mouth daily.       [name] gabapentin (NEURONTIN)  100 MG capsule Take 200 mg by mouth at bedtime.      [name] glucosamine-chondroitin 500-400 MG tablet Take 1 tablet by mouth 2 (two) times daily.      . isosorbide mononitrate (IMDUR) 30 MG 24 hr tablet Take 1 tablet (30 mg total) by mouth daily.  90 tablet  3  . levothyroxine (SYNTHROID, LEVOTHROID) 88 MCG tablet Take 88 mcg by mouth daily.      . nitroGLYCERIN  (NITROSTAT) 0.4 MG SL tablet Place 1 tablet (0.4 mg total) under the tongue every 5 (five) minutes as needed for chest pain.  90 tablet  3  . pantoprazole (PROTONIX) 40 MG tablet Take 1 tablet (40 mg total) by mouth daily.  90 tablet  1  . sertraline (ZOLOFT) 25 MG tablet Take 25 mg by mouth daily.      . trandolapril (MAVIK) 4 MG tablet Take 1 tablet (4 mg total) by mouth daily.  90 tablet  1  . vitamin E (VITAMIN E) 400 UNIT capsule Take 400 Units by mouth daily.       No current facility-administered medications for this visit.    Allergies  Allergen Reactions  . Avastin [Bevacizumab]     Muscle pain  . Benadryl [Diphenhydramine]     Heart race  . Beta Adrenergic Blockers Other (See Comments)    fatigue  . Other     Statin-muscle pain    History   Social History  . Marital Status: Widowed    Spouse Name: N/A    Number of Children: N/A  . Years of Education: N/A   Occupational History  . Not on file.   Social History Main Topics  . Smoking status: Former Smoker -- 1.00 packs/day for 40 years    Types: Cigarettes    Quit date: 09/18/1987  . Smokeless tobacco: Never Used  . Alcohol Use: No  . Drug Use: No  . Sexual Activity: Not Currently   Other Topics Concern  . Not on file   Social History Narrative   She is a widowed mother of 82, grandmother of 26. She'd like to try to exercise at least 2 times a week doing elliptical trainer about 20 minutes at a time. But she's noticed that she's not able to do as much as she used to mostly due to the hip and buttock pain.     Review of Systems: General: negative for chills, fever, night sweats or weight changes.  Cardiovascular: negative for chest pain, dyspnea on exertion, edema, orthopnea, palpitations, paroxysmal nocturnal dyspnea or shortness of breath Dermatological: negative for rash Respiratory: negative for cough or wheezing Urologic: negative for hematuria Abdominal: negative for nausea, vomiting, diarrhea, bright  red blood per rectum, melena, or hematemesis Neurologic: negative for visual changes, syncope, or dizziness All other systems reviewed and are otherwise negative except as noted above.    Blood pressure 160/80, pulse 79, height 5' 4.5" (1.638 m), weight 146 lb (66.225 kg).  General appearance: alert, cooperative and no distress Neck: no carotid bruit and no JVD Lungs: clear to auscultation bilaterally Heart: regular rate and rhythm, S1, S2 normal, no murmur, click, rub or gallop Extremities: no LEE Pulses: 2+ and symmetric Skin: warm and dry Neurologic: Grossly normal  EKG Not performed  ASSESSMENT AND PLAN:   1. Unstable angina: Patient has had recent resting substernal chest pressure relieved with sublingual nitroglycerin as well as persistent fatigue, dyspnea on exertion and decreased exercise tolerance. Systolic function is normal by echo and  recent laboratory work including a CBC, TSH, vitamin D and BMET have all been normal. Her last cardiac catheterization was in 2008 which at that time revealed moderate AV groove disease with 70-80% stenosis but with right to left collaterals. It was mentioned in the cath report that she may be considered for angioplasty if she continued to have symptoms. After further discussion with the patient, we have decided to proceed with repeat left heart catheterization to redefine her coronary anatomy.  PLAN  Plan for outpatient left heart catheterization to assess for ischemia, with potential for PCI.  Odesser Tourangeau, BRITTAINYPA-C 11/07/2013 12:21 PM

## 2013-11-19 ENCOUNTER — Other Ambulatory Visit: Payer: Self-pay | Admitting: *Deleted

## 2013-11-19 MED ORDER — TRANDOLAPRIL 4 MG PO TABS: 4.0000 mg | ORAL_TABLET | Freq: Every day | ORAL | Status: DC

## 2013-11-21 ENCOUNTER — Telehealth: Payer: Self-pay | Admitting: Cardiology

## 2013-11-21 NOTE — Telephone Encounter (Signed)
Pt is not feeling well at all. She wanted to talk to you about B12 and her blood work that was done last week.

## 2013-11-21 NOTE — Telephone Encounter (Signed)
I don't think that she should have any worsening symptoms simply because the cath. I think she is just depressed because the cath did not answer her question. There was really no change from previous evaluation and this is a new symptom. I think B12 shots may very well be helpful, but she was not anemic on her precath labs.  She is now on any medications that would make me think fatigue.  I can only hope that if she just works through her symptoms and continues to stay active but does bowl resolved spontaneously.  I have a hard time justifying extensive PCI on a severely diffuse diseased small RCA with in-stent restenosis no change from 7 years ago.  At this point I think were kind of to the PCP evaluation standpoint.  Leonie Man, MD

## 2013-11-21 NOTE — Telephone Encounter (Signed)
Returned call to patient's daughter [name] she stated her mother had a cardiac cath done last week and she still is complaining of fatigue.Stated fatigue is worse since her cath.No chest pain. Stated she wanted to know if she could have a series of B12 injections.Advised she will need to see PCP.Follow up cath appointment scheduled with Lyda Jester PA 12/10/13 at 2:00 pm.Message sent to [place] for advice.

## 2013-11-22 NOTE — Telephone Encounter (Signed)
Spoke to patient's daughter [name] was told Dr.Harding's advice.Advised to see PCP for fatigue.

## 2013-11-23 ENCOUNTER — Other Ambulatory Visit: Payer: Self-pay

## 2013-11-23 MED ORDER — AMLODIPINE BESYLATE 2.5 MG PO TABS: 2.5000 mg | ORAL_TABLET | Freq: Every day | ORAL | Status: DC

## 2013-11-23 NOTE — Telephone Encounter (Signed)
Rx was sent to pharmacy electronically. 

## 2013-12-10 ENCOUNTER — Ambulatory Visit: Payer: Medicare Other | Admitting: Cardiology

## 2014-03-28 ENCOUNTER — Encounter (HOSPITAL_COMMUNITY): Payer: Self-pay | Admitting: Cardiovascular Disease

## 2014-06-06 ENCOUNTER — Other Ambulatory Visit: Payer: Self-pay

## 2014-06-06 MED ORDER — ISOSORBIDE MONONITRATE ER 30 MG PO TB24: 30.0000 mg | ORAL_TABLET | Freq: Every day | ORAL | Status: DC

## 2014-06-06 MED ORDER — TRANDOLAPRIL 4 MG PO TABS: 4.0000 mg | ORAL_TABLET | Freq: Every day | ORAL | Status: DC

## 2014-06-06 MED ORDER — AMLODIPINE BESYLATE 2.5 MG PO TABS: 2.5000 mg | ORAL_TABLET | Freq: Every day | ORAL | Status: DC

## 2014-06-06 NOTE — Telephone Encounter (Signed)
Rx(s) sent to pharmacy electronically.  

## 2014-07-22 ENCOUNTER — Telehealth (HOSPITAL_COMMUNITY): Payer: Self-pay | Admitting: *Deleted

## 2014-09-02 ENCOUNTER — Other Ambulatory Visit: Payer: Self-pay | Admitting: Cardiovascular Disease

## 2014-09-02 ENCOUNTER — Ambulatory Visit (HOSPITAL_COMMUNITY)
Admission: RE | Admit: 2014-09-02 | Discharge: 2014-09-02 | Disposition: A | Discharge status: Home / Self Care | Payer: Medicare HMO | Source: Ambulatory Visit | Attending: Cardiology | Admitting: Cardiology

## 2014-09-02 DIAGNOSIS — I714 Abdominal aortic aneurysm, without rupture, unspecified: Secondary | ICD-10-CM

## 2014-09-02 NOTE — Progress Notes (Signed)
Abdominal Aorta Duplex Completed. Stable mild dilatation of the distal aorta measuring 2.6 x 2.4 cm. Oda Cogan, BS, RDMS, RVT

## 2014-09-18 HISTORY — PX: OTHER SURGICAL HISTORY: SHX169

## 2015-01-03 ENCOUNTER — Telehealth: Payer: Self-pay | Admitting: Cardiology

## 2015-01-03 NOTE — Telephone Encounter (Signed)
Close encounter 

## 2015-01-07 ENCOUNTER — Ambulatory Visit: Payer: Medicare HMO | Admitting: Cardiology

## 2015-01-13 ENCOUNTER — Ambulatory Visit (INDEPENDENT_AMBULATORY_CARE_PROVIDER_SITE_OTHER): Payer: Medicare HMO | Admitting: Physician Assistant

## 2015-01-13 ENCOUNTER — Encounter: Payer: Self-pay | Admitting: Physician Assistant

## 2015-01-13 ENCOUNTER — Encounter: Payer: Self-pay | Admitting: *Deleted

## 2015-01-13 VITALS — BP 120/60 | HR 90 | Ht 64.0 in | Wt 148.2 lb

## 2015-01-13 DIAGNOSIS — R0609 Other forms of dyspnea: Secondary | ICD-10-CM | POA: Diagnosis not present

## 2015-01-13 DIAGNOSIS — I2510 Atherosclerotic heart disease of native coronary artery without angina pectoris: Secondary | ICD-10-CM | POA: Diagnosis not present

## 2015-01-13 DIAGNOSIS — Z9861 Coronary angioplasty status: Secondary | ICD-10-CM

## 2015-01-13 NOTE — Patient Instructions (Addendum)
Your physician recommends that you schedule a follow-up appointment in: WITH DR Ivey  Your physician has requested that you have a lexiscan myoview. For further information please visit [email or web address]. Please follow instruction sheet, as given.

## 2015-01-13 NOTE — Progress Notes (Addendum)
Cardiology Office Note   Date:  01/13/2015   ID:  [name], DOB April 14, 1930, MRN [id number]  PCP:  Drake Leach, MD  Cardiologist:  Dr Nolon Stalls, PA-C    Chief Complaint  Patient presents with  . Follow-up    C/O increasing SOB over the past month or so.  Overall weakness, tiredness and fatigue for months.  No chest pain, edema.  Occas. lightheadedness.  Scheduled for endoscopic exam 10/18 by [facility].    History of Present Illness: [name] is a 81 y.o. female with a history of CAD, treated medically after last   [name] presents for evaluation of fatigue and dyspnea on exertion.  [name] has been having these symptoms for a long time. They are the reason she had a heart catheterization in 2015, results are below and medical therapy was recommended. Dr. Sallyanne Havers follows her closely for internal medicine issues. He follows her thyroid closely and he also reviewed her medications. He advised the patient and her daughter that there was no obvious culprit for her symptoms among her medications.  Per the patient and her daughter, dyspnea on exertion has gotten worse recently. She was previously able to walk 2 miles at a slow paced which she did regularly for exercise. She would also go to the gym twice a week. However, in the last 2 months, she has been unable to go to the gym and the last time she tried to walk she barely made it a half mile and was extremely fatigued and short of breath. She denies any recent chest pain. She is aware that her exercises significantly lower when it's hot outside.  She has not had lower extremity edema, denies orthopnea or PND. She is compliant with her medications. She was to know she needs to be on all this stuff.   Past Medical History  Diagnosis Date  . Macular degeneration     "started out dry; now is wet" (01/08/2013)  . Hypothyroidism   . CAD S/P percutaneous coronary angioplasty     Cath 2010: 100%  occlusion of RCA  (2 BMS stents finally occluded) w/ left to right collaterals; moderate LAD and circumflex disease.  EF 55-60%;; cath 2015 with medical therapy recommended   . Abdominal aortic aneurysm     asymptomatic, 2012 -  2.6 x 2.4cm  . Hypertension   . Hyperlipidemia   . PAD (peripheral artery disease) 12/2005    s/p L Common Iliac Stent  . Myocardial infarction 2003  . Walking pneumonia 1970's  . Exertional shortness of breath   . History of stomach ulcers   . Chronic lower back pain   . Depression   . PONV (postoperative nausea and vomiting)   . Migraines        . Daily headache        . Anxiety     Past Surgical History  Procedure Laterality Date  . Doppler echocardiography  07/20/2004    LV normal,trace mitral regurg;mild triscuspid  . Nm myocar perf wall motion  12/27/2011 -[place]    LV normal,EF 81 %  . Nm myocar perf wall motion  07/20/2004    71% EF;? INF ISCHEMIA  . Cardiac catheterization  11/2008    diffuse ISR in  RCA w/ good L-R collaterals; AV groove Cx - 60-70% stenosis with post stenosis ectasia;  EF 55%, mild  CAD in LAD -- Med Rx  . Abd aotra doppler  08/14/2012    distal mild dilatation 2.6 x 2.5cm unchanged;right cia 70-99%  new finding  . Iliac artery stent Left 12/30/2005    PTA anddirect stenting  of left common iliac artery - 8.0 x 18 mm OmniLink stent  . Coronary angioplasty with stent placement  Jan 2004    RCA - 2 overlapping Pixel BMS 2.0 mm x 18 mm & 2.0 mm x 23 m   . Coronary angioplasty  April 2006    Cutting Balloon PTCA of RCA ISR  . Angioplasty Right 01/08/2013    unsucess stenting RLE   . Appendectomy    . Cholecystectomy    . Cataract extraction w/ intraocular lens  implant, bilateral Bilateral   . Dilation and curettage of uterus      "couple over the years" (01/08/2013)  . Iliac artery stent Right 02/13/2013    successful diamondback orbital rotation lipectomy, PTA and stenting of highly calcified ostial common iliac  artery/notes 02/13/2013  . Lower extremity angiogram N/A 01/08/2013    Procedure: LOWER EXTREMITY ANGIOGRAM;  Surgeon: Lorretta Harp, MD;  Location: [facility] CATH LAB;  Service: Cardiovascular;  Laterality: N/A;  . Abdominal aortagram  01/08/2013    Procedure: ABDOMINAL AORTAGRAM;  Surgeon: Lorretta Harp, MD;  Location: [facility] CATH LAB;  Service: Cardiovascular;;  . Left heart catheterization with coronary angiogram N/A 11/13/2013    Procedure: LEFT HEART CATHETERIZATION WITH CORONARY ANGIOGRAM;  Surgeon: Leonie Man, MD; LAD 40%, CFX 60%, OM1 60%, RCA diffuse in-stent restenosis up to 99%, small PDA and PLA, collaterals from left system noted, EF 55-60 percent    Current Outpatient Prescriptions  Medication Sig Dispense Refill  . ALPRAZolam (XANAX) 0.5 MG tablet Take 0.5 mg by mouth daily as needed for anxiety.    [name] amLODipine (NORVASC) 2.5 MG tablet Take 1 tablet (2.5 mg total) by mouth daily. 90 tablet 1  . aspirin 81 MG tablet Take 81 mg by mouth daily.    . fish oil-omega-3 fatty acids 1000 MG capsule Take 1 g by mouth daily.     [name] glucosamine-chondroitin 500-400 MG tablet Take 1 tablet by mouth 2 (two) times daily.    . isosorbide mononitrate (IMDUR) 30 MG 24 hr tablet Take 1 tablet (30 mg total) by mouth daily. 90 tablet 1  . levothyroxine (SYNTHROID, LEVOTHROID) 100 MCG tablet Take 100 mcg by mouth daily.    . nitroGLYCERIN (NITROSTAT) 0.4 MG SL tablet Place 1 tablet (0.4 mg total) under the tongue every 5 (five) minutes as needed for chest pain. 90 tablet 3  . ranitidine (ZANTAC) 150 MG capsule Take 150 mg by mouth 2 (two) times daily.     . sertraline (ZOLOFT) 25 MG tablet Take 25 mg by mouth daily.    . trandolapril (MAVIK) 4 MG tablet Take 1 tablet (4 mg total) by mouth daily. 90 tablet 1   No current facility-administered medications for this visit.    Allergies:   Avastin; Benadryl; Beta adrenergic blockers; and Other    Social History:  The patient  reports that she quit  smoking about 27 years ago. Her smoking use included Cigarettes. She has a 40 pack-year smoking history. She has never used smokeless tobacco. She reports that she does not drink alcohol or use illicit drugs.   Family History:  The patient's parents have no history of premature coronary artery disease.   ROS:  Please see the history of present illness. All other systems are reviewed and negative.  PHYSICAL EXAM: VS:  BP 120/60 mmHg  Pulse 90  Ht 5\' 4"  (1.626 m)  Wt 148 lb 3.2 oz (67.223 kg)  BMI 25.43 kg/m2 , BMI Body mass index is 25.43 kg/(m^2). GEN: Well nourished, well developed, in no acute distress HEENT: normal Neck: no JVD, carotid bruits, or masses Cardiac: RRR; no murmurs, rubs, or gallops,no edema  Respiratory:  clear to auscultation bilaterally, normal work of breathing GI: soft, nontender, nondistended, + BS MS: no deformity or atrophy; distal pulses are 2+ in all 4 extremities Skin: warm and dry, no rash Neuro:  Strength and sensation are intact Psych: euthymic mood, full affect   EKG:  EKG is ordered today. The ekg ordered today demonstrates sinus rhythm with a PVC, nonspecific ST changes not significantly different from ECG 2014  Echo: 10/29/2013 Conclusions - Left ventricle: The cavity size was normal. Wall thickness was normal. Systolic function was normal. The estimated ejection fraction was in the range of 50% to 55%. Wall motion was normal; there were no regional wall motion abnormalities. Doppler parameters are consistent with abnormal left ventricular relaxation (grade 1 diastolic dysfunction). - Mitral valve: There was mild regurgitation. - Tricuspid valve: There was mild-moderate regurgitation. - Pulmonary arteries: PA peak pressure: 47 mm Hg (S). Impressions: - Low normal LV function; grade 1 diastolic dysfunction; mild to moderate TR; mild MR.  Recent Labs: Per Dr. Rudolpho Sevin Readings from Last 3 Encounters:  01/13/15 148 lb 3.2  oz (67.223 kg)  11/13/13 147 lb (66.679 kg)  11/07/13 146 lb (66.225 kg)     Other studies Reviewed: Additional studies/ records that were reviewed today include: Previous cath report, office notes, ECGs.  ASSESSMENT AND PLAN:  1. Dyspnea on exertion: According to the patient and her daughter, there has been significant progression in her symptoms. I reviewed the results of the last cardiac catheterization with them. We discussed options for medical therapy, however the patient does not wish her blood pressure to run any lower so she does not wish to increase the amlodipine. She has been on a higher dose of isosorbide in the past but did not tolerate that either. Ranexa was discussed but she is reluctant to add another medication, especially since it does not come generic.  Advised her that the small vessel disease had to be treated medically because there were no other options. However, if one of the lesions in the larger vessels have progressed, a stent would help.  Recommended that we try a stress test, then a cardiac catheterization if that were significantly abnormal. If the stress test was not significantly abnormal, continue medical therapy for small vessel disease and consider Ranexa.  2. Lipid status: Dr. Sallyanne Havers follows her cholesterol. She has not tolerated statins in the past but cannot remember exactly which ones are the doses. We'll leave this to Dr. Sallyanne Havers but if she could tolerate a low dose of a statin even one or 2 times a week, studies have shown this will help lower her LDL.  Current medicines are reviewed at length with the patient today.  The patient does not have concerns regarding medicines.  The following changes have been made:  no change  Labs/ tests ordered today include:   Orders Placed This Encounter  Procedures  . Myocardial Perfusion Imaging  . EKG 12-Lead     Disposition:   FU with Dr. Ellyn Hack, first available  Signed, Lenoard Aden    01/13/2015 12:51 PM    [facility] [address], [place], [place]  [address] Phone: [phone]; Fax: [phone]

## 2015-01-16 ENCOUNTER — Other Ambulatory Visit: Payer: Self-pay | Admitting: Cardiology

## 2015-01-16 ENCOUNTER — Telehealth (HOSPITAL_COMMUNITY): Payer: Self-pay

## 2015-01-16 NOTE — Telephone Encounter (Signed)
Encounter complete. 

## 2015-01-17 NOTE — Telephone Encounter (Signed)
Rx(s) sent to pharmacy electronically.  

## 2015-01-18 HISTORY — PX: NM MYOCAR PERF WALL MOTION: HXRAD629

## 2015-01-21 ENCOUNTER — Ambulatory Visit (HOSPITAL_COMMUNITY)
Admission: RE | Admit: 2015-01-21 | Discharge: 2015-01-21 | Disposition: A | Discharge status: Home / Self Care | Payer: Medicare HMO | Source: Ambulatory Visit | Attending: Urology | Admitting: Urology

## 2015-01-21 DIAGNOSIS — I739 Peripheral vascular disease, unspecified: Secondary | ICD-10-CM | POA: Insufficient documentation

## 2015-01-21 DIAGNOSIS — R42 Dizziness and giddiness: Secondary | ICD-10-CM | POA: Diagnosis not present

## 2015-01-21 DIAGNOSIS — R5383 Other fatigue: Secondary | ICD-10-CM | POA: Insufficient documentation

## 2015-01-21 DIAGNOSIS — I10 Essential (primary) hypertension: Secondary | ICD-10-CM | POA: Diagnosis not present

## 2015-01-21 DIAGNOSIS — I2510 Atherosclerotic heart disease of native coronary artery without angina pectoris: Secondary | ICD-10-CM

## 2015-01-21 DIAGNOSIS — I779 Disorder of arteries and arterioles, unspecified: Secondary | ICD-10-CM | POA: Diagnosis not present

## 2015-01-21 DIAGNOSIS — R0609 Other forms of dyspnea: Secondary | ICD-10-CM | POA: Diagnosis not present

## 2015-01-21 DIAGNOSIS — Z87891 Personal history of nicotine dependence: Secondary | ICD-10-CM | POA: Diagnosis not present

## 2015-01-21 DIAGNOSIS — R079 Chest pain, unspecified: Secondary | ICD-10-CM | POA: Insufficient documentation

## 2015-01-21 DIAGNOSIS — R002 Palpitations: Secondary | ICD-10-CM | POA: Diagnosis not present

## 2015-01-21 DIAGNOSIS — Z9861 Coronary angioplasty status: Secondary | ICD-10-CM | POA: Diagnosis not present

## 2015-01-21 LAB — MYOCARDIAL PERFUSION IMAGING
CHL CUP NUCLEAR SRS: 0
CHL CUP NUCLEAR SSS: 0
CSEPPHR: 100 {beats}/min
LV dias vol: 58 mL
LVSYSVOL: 26 mL
Rest HR: 70 {beats}/min
SDS: 0
TID: 1.4

## 2015-01-21 MED ORDER — AMINOPHYLLINE 25 MG/ML IV SOLN
100.0000 mg | Freq: Once | INTRAVENOUS | Status: AC
  Administered 2015-01-21: 100 mg via INTRAVENOUS

## 2015-01-21 MED ORDER — TECHNETIUM TC 99M SESTAMIBI GENERIC - CARDIOLITE
10.5000 | Freq: Once | INTRAVENOUS | Status: AC | PRN
  Administered 2015-01-21: 10.5 via INTRAVENOUS

## 2015-01-21 MED ORDER — TECHNETIUM TC 99M SESTAMIBI GENERIC - CARDIOLITE
31.0000 | Freq: Once | INTRAVENOUS | Status: AC | PRN
  Administered 2015-01-21: 31 via INTRAVENOUS

## 2015-01-21 MED ORDER — REGADENOSON 0.4 MG/5ML IV SOLN
0.4000 mg | Freq: Once | INTRAVENOUS | Status: AC
  Administered 2015-01-21: 0.4 mg via INTRAVENOUS

## 2015-02-14 ENCOUNTER — Other Ambulatory Visit: Payer: Self-pay | Admitting: Cardiology

## 2015-02-14 NOTE — Telephone Encounter (Signed)
Rx(s) sent to pharmacy electronically.  

## 2015-03-10 ENCOUNTER — Ambulatory Visit (INDEPENDENT_AMBULATORY_CARE_PROVIDER_SITE_OTHER): Payer: Medicare HMO | Admitting: Cardiology

## 2015-03-10 VITALS — BP 110/76 | HR 68 | Ht 64.0 in | Wt 145.0 lb

## 2015-03-10 DIAGNOSIS — Z9861 Coronary angioplasty status: Secondary | ICD-10-CM

## 2015-03-10 DIAGNOSIS — I2510 Atherosclerotic heart disease of native coronary artery without angina pectoris: Secondary | ICD-10-CM

## 2015-03-10 DIAGNOSIS — I739 Peripheral vascular disease, unspecified: Secondary | ICD-10-CM

## 2015-03-10 DIAGNOSIS — I10 Essential (primary) hypertension: Secondary | ICD-10-CM | POA: Diagnosis not present

## 2015-03-10 DIAGNOSIS — I951 Orthostatic hypotension: Secondary | ICD-10-CM | POA: Diagnosis not present

## 2015-03-10 DIAGNOSIS — E785 Hyperlipidemia, unspecified: Secondary | ICD-10-CM | POA: Diagnosis not present

## 2015-03-10 MED ORDER — TRANDOLAPRIL 2 MG PO TABS: 2.0000 mg | ORAL_TABLET | Freq: Every day | ORAL | Status: DC

## 2015-03-10 MED ORDER — NITROGLYCERIN 0.4 MG SL SUBL: 0.4000 mg | SUBLINGUAL_TABLET | SUBLINGUAL | Status: DC | PRN

## 2015-03-10 NOTE — Patient Instructions (Signed)
Your physician wants you to follow-up in 6 month with DR HARDING. You will receive a reminder letter in the mail two months in advance. If you don't receive a letter, please call our office to schedule the follow-up appointment.  DECREASE MAVIK TO 2 MG FROM 4 MG  STOP IMDUR (ISOSORBIDE)  PLEASE CALL OFFICE IN 2 MONTHS TO SEE HOW YOU ARE DOING.   If you need a refill on your cardiac medications before your next appointment, please call your pharmacy.

## 2015-03-10 NOTE — [place] Notes (Signed)
PCP: Drake Leach, MD  Clinic Note: Chief Complaint  Patient presents with  . Follow-up    no chest pain no edema some light headedness and dizziness about once a week  . Shortness of Breath    a little   . Dizziness    HPI: [name] is a 81 y.o. female with a PMH below who presents today for 3 month f/u & to discuss Myoview. I personally have not seen her since August 2014. She has been seen by Dr. Gwenlyn Found for consultation and post procedural evaluation for PAD back in September and October 2014. She had diamondback orbital rotational atherectomy with stenting of the ostial right common iliac artery distant history of left iliac stent in 2007. Her hip claudication symptoms totally improved. She had a cardiac catheterization in July 2015 for what was presumed to be crescendo class III angina. At that time was referred by Ellen Henri, PA-C.  This showed severe stenosis in the RCA but with diffuse collaterals. No change from prior cath. Plan was medical management. - Started on amlodipine.  [name] was last seen on Sept 16 by Lenoard Aden for evaluation of Fatigue & DOE --> had Myoview. -- Per the patient and her daughter, dyspnea on exertion has gotten worse recently. She was previously able to walk 2 miles at a slow paced which she did regularly for exercise.She denies any recent chest pain. She exercises significantly less when it's hot or cold outside.  -- Myoview stress test was ordered to evaluate exertional dyspnea.  Recent Hospitalizations: n/a  Studies Reviewed:   Myoview 10/4/'6: Nuclear stress EF: 56%.The study   is normal.This is a low risk study.  The left ventricular ejection fraction is normal (55-65%).  There was no ST segment deviation noted during stress.  No T wave inversion was noted during stress.  Defect 1: There is a small defect of mild severity present in the apical anterior location that is likely due to breast attenuation, but cannot  rule out a small area of mild ischemia.   AAA Duplex 09/2014: : - No change from prior study; 2.6 x 2.4 cm mild AAA. R Com Iliac A  > 50%. -- f/u 24 months.  2D Echo 10/2013: EF 50-55%, Gr 1 DD.  Mild-mod TR, PAP 47 mmHg  Interval History: Still with SOB & tired - mostly @ rest.  Better when up & about.  Low energy.  Dizzy & swimmyheaded. No resting or exertional chest tightness/pressure.   No PND, orthopnea or edema.  Occasional palpitations but not rapid  No syncope/near syncope, or TIA/amaurosis fugax symptoms. No melena, hematochezia, hematuria, or epstaxis. No claudication.  ROS: A comprehensive was performed. Review of Systems  Constitutional: Positive for malaise/fatigue.  Eyes:       Legally blind  Cardiovascular: Negative for leg swelling.  Gastrointestinal: Negative for blood in stool and melena.  Genitourinary: Negative for hematuria.  Musculoskeletal: Negative.   Neurological: Positive for dizziness. Negative for tingling, sensory change, speech change, focal weakness, loss of consciousness, weakness and headaches.  All other systems reviewed and are negative.   Past Medical History  Diagnosis Date  . Macular degeneration     "started out dry; now is wet" (01/08/2013)  . Hypothyroidism   . CAD S/P percutaneous coronary angioplasty     Cath 2010: 100% occlusion of RCA  (2 BMS stents finally occluded) w/ left to right collaterals; moderate LAD and circumflex disease.  EF 55-60%;; cath 2015 with  medical therapy recommended   . Abdominal aortic aneurysm ([place])     asymptomatic, 2012 -  2.6 x 2.4cm  . Hypertension   . Hyperlipidemia   . PAD (peripheral artery disease) ([place]) 12/2005    s/p L Common Iliac Stent  . Myocardial infarction ([place]) 2003  . Walking pneumonia 1970's  . Exertional shortness of breath   . History of stomach ulcers   . Chronic lower back pain   . Depression   . PONV (postoperative nausea and vomiting)   . Migraines        . Daily headache         . Anxiety     Past Surgical History  Procedure Laterality Date  . Doppler echocardiography  07/20/2004    LV normal,trace mitral regurg;mild triscuspid  . Nm myocar perf wall motion  12/27/2011 -[place]    LV normal,EF 81 %  . Nm myocar perf wall motion  07/20/2004    71% EF;? INF ISCHEMIA  . Cardiac catheterization  11/2008    diffuse ISR in  RCA w/ good L-R collaterals; AV groove Cx - 60-70% stenosis with post stenosis ectasia;  EF 55%, mild  CAD in LAD -- Med Rx  . Abd aotra doppler  08/14/2012    distal mild dilatation 2.6 x 2.5cm unchanged;right cia 70-99%  new finding  . Iliac artery stent Left 12/30/2005    PTA anddirect stenting  of left common iliac artery - 8.0 x 18 mm OmniLink stent  . Coronary angioplasty with stent placement  Jan 2004    RCA - 2 overlapping Pixel BMS 2.0 mm x 18 mm & 2.0 mm x 23 m   . Coronary angioplasty  April 2006    Cutting Balloon PTCA of RCA ISR  . Angioplasty Right 01/08/2013    unsucess stenting RLE   . Appendectomy    . Cholecystectomy    . Cataract extraction w/ intraocular lens  implant, bilateral Bilateral   . Dilation and curettage of uterus      "couple over the years" (01/08/2013)  . Iliac artery stent Right 02/13/2013    successful diamondback orbital rotation lipectomy, PTA and stenting of highly calcified ostial common iliac artery/notes 02/13/2013  . Lower extremity angiogram N/A 01/08/2013    Procedure: LOWER EXTREMITY ANGIOGRAM;  Surgeon: Lorretta Harp, MD;  Location: [facility] CATH LAB;  Service: Cardiovascular;  Laterality: N/A;  . Abdominal aortagram  01/08/2013    Procedure: ABDOMINAL AORTAGRAM;  Surgeon: Lorretta Harp, MD;  Location: [facility] CATH LAB;  Service: Cardiovascular;;  . Left heart catheterization with coronary angiogram N/A 11/13/2013    Procedure: LEFT HEART CATHETERIZATION WITH CORONARY ANGIOGRAM;  Surgeon: Leonie Man, MD; LAD 40%, CFX 60%, OM1 60%, RCA diffuse in-stent restenosis up to 99%, small PDA and PLA,  collaterals from left system noted, EF 55-60 percent   Prior to Admission medications   Medication Sig Start Date End Date Taking? Authorizing Provider  ALPRAZolam Duanne Moron) 0.5 MG tablet Take 0.5 mg by mouth daily as needed for anxiety.   Yes Historical Provider, MD  amLODipine (NORVASC) 2.5 MG tablet Take 1 tablet (2.5 mg total) by mouth daily. 02/14/15  Yes Leonie Man, MD  aspirin 81 MG tablet Take 81 mg by mouth daily.   Yes Historical Provider, MD  fish oil-omega-3 fatty acids 1000 MG capsule Take 1 g by mouth daily.    Yes Historical Provider, MD  glucosamine-chondroitin 500-400 MG tablet Take 1 tablet by mouth  2 (two) times daily.   Yes Historical Provider, MD  isosorbide mononitrate (IMDUR) 30 MG 24 hr tablet TAKE 1 TABLET EVERY DAY 02/14/15  Yes Leonie Man, MD  levothyroxine (SYNTHROID, LEVOTHROID) 100 MCG tablet Take 100 mcg by mouth daily. 10/29/14  Yes Historical Provider, MD  nitroGLYCERIN (NITROSTAT) 0.4 MG SL tablet Place 1 tablet (0.4 mg total) under the tongue every 5 (five) minutes as needed for chest pain. 01/23/13  Yes Lorretta Harp, MD  ranitidine (ZANTAC) 150 MG capsule Take 150 mg by mouth 2 (two) times daily.    Yes Historical Provider, MD  sertraline (ZOLOFT) 25 MG tablet Take 25 mg by mouth daily.   Yes Historical Provider, MD  trandolapril (MAVIK) 4 MG tablet Take 1 tablet (4 mg total) by mouth daily. 01/17/15  Yes Leonie Man, MD   Allergies  Allergen Reactions  . Levofloxacin Other (See Comments)    And myalgias  . Avastin [Bevacizumab]     Muscle pain  . Benadryl [Diphenhydramine]     Heart race  . Beta Adrenergic Blockers Other (See Comments)    fatigue  . Other     Statin-muscle pain    Social History   Social History  . Marital Status: Widowed    Spouse Name: N/A  . Number of Children: N/A  . Years of Education: N/A   Social History Main Topics  . Smoking status: Former Smoker -- 1.00 packs/day for 40 years    Types: Cigarettes     Quit date: 09/18/1987  . Smokeless tobacco: Never Used  . Alcohol Use: No  . Drug Use: No  . Sexual Activity: Not Currently   Other Topics Concern  . None   Social History Narrative   She is a widowed mother of 45, grandmother of 29. She'd like to try to exercise at least 2 times a week doing elliptical trainer about 20 minutes at a time. But she's noticed that she's not able to do as much as she used to mostly due to the hip and buttock pain.   History reviewed. No pertinent family history.   Wt Readings from Last 3 Encounters:  03/10/15 145 lb (65.772 kg)  01/21/15 148 lb (67.132 kg)  01/13/15 148 lb 3.2 oz (67.223 kg)    PHYSICAL EXAM BP 110/76 mmHg  Pulse 68  Ht 5\' 4"  (1.626 m)  Wt 145 lb (65.772 kg)  BMI 24.88 kg/m2   Orthostatic pressures checked: Sitting 139/69 (82), Standing 116/71(88) --> 127/73 (92) GEN: Well nourished, well developed, in no acute distress  HEENT: normal  Neck: no JVD, carotid bruits, or masses Cardiac: RRR; no murmurs, rubs, or gallops,no edema  Respiratory: clear to auscultation bilaterally, normal work of breathing GI: soft, nontender, nondistended, + BS MS: no deformity or atrophy; distal pulses are 2+ in all 4 extremities  Skin: warm and dry, no rash Neuro: Strength and sensation are intact Psych: euthymic mood, full affect    Adult ECG Report  n/a   Other studies Reviewed: Additional studies/ records that were reviewed today include:  Recent Labs:   None available   ASSESSMENT / PLAN: Problem List Items Addressed This Visit    PAD - s/p diamondback orbital rotation atherectomy, PTA + stenting of calcified ostical right CIA- 02/13/13 (Chronic)    Notable improvement in claudication symptoms.      Relevant Medications   trandolapril (MAVIK) 2 MG tablet   nitroGLYCERIN (NITROSTAT) 0.4 MG SL tablet   Orthostatic hypotension (Chronic)  Clearly had some features of what static hypotension on evaluation today. I would like to  keep the amlodipine on for antianginal effect. Not on beta blocker mostly because of low resting heart rate and fatigue in the past. I would like to reduce her ACE inhibitor dose in half from 4 to 2 mg. Can also DC Imdur which may help      Relevant Medications   trandolapril (MAVIK) 2 MG tablet   nitroGLYCERIN (NITROSTAT) 0.4 MG SL tablet   Hyperlipidemia with target LDL less than 70 (Chronic)    No longer on atorvastatin. Labs have been followed by PCP. Would be nice to see some lab work checked intermittently. Her daughter is not sure when last summer labs were checked, therefore I will not order surveillance panel.  Will defer to PCP.      Relevant Medications   trandolapril (MAVIK) 2 MG tablet   nitroGLYCERIN (NITROSTAT) 0.4 MG SL tablet   Essential hypertension (Chronic)    I would like to shoot for a little bit of higher baseline blood pressure.  Okay reducing her ACE inhibitor.      Relevant Medications   trandolapril (MAVIK) 2 MG tablet   nitroGLYCERIN (NITROSTAT) 0.4 MG SL tablet   CAD S/P percutaneous coronary angioplasty - Primary (Chronic)    I don't think her symptoms are related to angina. She is having some exertional dyspnea, but no chest tightness or pressure. Cardiac catheterization in 2015 showed essentially occluded RCA. No PCI options. Myoview is negative for significant ischemia. Plan continue low-dose amlodipine for antianginal effect. I think at this point since she's not having significant symptoms and the stress test was negative for ischemia we can stop Imdur because his may be making her dizzy. Continue aspirin and ACE inhibitor. But we will do reduce the dose of ACE inhibitor.      Relevant Medications   trandolapril (MAVIK) 2 MG tablet   nitroGLYCERIN (NITROSTAT) 0.4 MG SL tablet      Current medicines are reviewed at length with the patient today. (+/- concerns) concerned about dizziness and fatigue with maybe low blood pressures  The  following changes have been made:  DECREASE MAVIK TO 2 MG FROM 4 MG  STOP IMDUR (ISOSORBIDE)   Studies Ordered:   No orders of the defined types were placed in this encounter.    ROV 6 months  HARDING, Leonie Green, M.D., M.S. Interventional Cardiologist   Pager # [phone]

## 2015-03-12 ENCOUNTER — Encounter: Payer: Self-pay | Admitting: Cardiology

## 2015-03-12 DIAGNOSIS — I951 Orthostatic hypotension: Secondary | ICD-10-CM | POA: Insufficient documentation

## 2015-03-12 NOTE — Assessment & Plan Note (Signed)
No longer on atorvastatin. Labs have been followed by PCP. Would be nice to see some lab work checked intermittently. Her daughter is not sure when last summer labs were checked, therefore I will not order surveillance panel.  Will defer to PCP.

## 2015-03-12 NOTE — Assessment & Plan Note (Signed)
I don't think her symptoms are related to angina. She is having some exertional dyspnea, but no chest tightness or pressure. Cardiac catheterization in 2015 showed essentially occluded RCA. No PCI options. Myoview is negative for significant ischemia. Plan continue low-dose amlodipine for antianginal effect. I think at this point since she's not having significant symptoms and the stress test was negative for ischemia we can stop Imdur because his may be making her dizzy. Continue aspirin and ACE inhibitor. But we will do reduce the dose of ACE inhibitor.

## 2015-03-12 NOTE — Assessment & Plan Note (Signed)
Notable improvement in claudication symptoms.

## 2015-03-12 NOTE — Assessment & Plan Note (Signed)
Clearly had some features of what static hypotension on evaluation today. I would like to keep the amlodipine on for antianginal effect. Not on beta blocker mostly because of low resting heart rate and fatigue in the past. I would like to reduce her ACE inhibitor dose in half from 4 to 2 mg. Can also DC Imdur which may help

## 2015-03-12 NOTE — Assessment & Plan Note (Signed)
I would like to shoot for a little bit of higher baseline blood pressure.  Okay reducing her ACE inhibitor.

## 2015-04-08 ENCOUNTER — Other Ambulatory Visit: Payer: Self-pay | Admitting: Pharmacist Clinician (PhC)/ Clinical Pharmacy Specialist

## 2015-04-10 MED ORDER — TRANDOLAPRIL 2 MG PO TABS: 2.0000 mg | ORAL_TABLET | Freq: Every day | ORAL | Status: DC

## 2015-06-18 ENCOUNTER — Telehealth: Payer: Self-pay | Admitting: Cardiology

## 2015-06-18 NOTE — Telephone Encounter (Signed)
New message   Pt daughter is calling for surgical clearance and she is not understanding   that the office has to call for the surgical clearance.  Pt was last seen 02/2015   Pt daughter is asking me to give last date of EKG and Echo  I informed her that I cant give it out over the phone and the RN can call her and they can discuss her mother information

## 2015-06-18 NOTE — Telephone Encounter (Signed)
Returned call to [place]. She explains that the patient has been approved for a clinical study for macular degeneration.  They are needing: -EKG performed w/in past 6 months -statement of clearance that patient is OK for anasthesia -full physical w/in past 30 days (she is going to re-see PCP tomorrow to take care of this, previously had this done in December)  The surgical center sent her forms which she will forward to our office. Advised to fax to Sharon's attention.   Routed to Dr. Ellyn Hack and Ivin Booty.

## 2015-06-19 NOTE — Telephone Encounter (Signed)
Acknowledged. Fax pending from daughter of patient.

## 2015-06-19 NOTE — Telephone Encounter (Signed)
I saw her in November to review a Nuclear ST - no ischemia.  Low Risk for surgery.  Leonie Man, MD

## 2015-06-20 NOTE — Telephone Encounter (Signed)
Faxed and mailed clearance

## 2015-06-20 NOTE — Telephone Encounter (Signed)
Spoke to daughter Suzi Roots. Informed her Dr Ellyn Hack, answered question  And letter will be faxed MD [facility] [employer]LL[employer] AND MAIL TO DAUGHTER

## 2015-10-31 ENCOUNTER — Encounter (INDEPENDENT_AMBULATORY_CARE_PROVIDER_SITE_OTHER): Payer: Medicare HMO | Admitting: Ophthalmology

## 2015-10-31 DIAGNOSIS — H353231 Exudative age-related macular degeneration, bilateral, with active choroidal neovascularization: Secondary | ICD-10-CM

## 2015-10-31 DIAGNOSIS — H35033 Hypertensive retinopathy, bilateral: Secondary | ICD-10-CM | POA: Diagnosis not present

## 2015-10-31 DIAGNOSIS — H4312 Vitreous hemorrhage, left eye: Secondary | ICD-10-CM

## 2015-10-31 DIAGNOSIS — H43813 Vitreous degeneration, bilateral: Secondary | ICD-10-CM | POA: Diagnosis not present

## 2015-10-31 DIAGNOSIS — I10 Essential (primary) hypertension: Secondary | ICD-10-CM | POA: Diagnosis not present

## 2015-11-27 ENCOUNTER — Encounter (INDEPENDENT_AMBULATORY_CARE_PROVIDER_SITE_OTHER): Payer: Self-pay | Admitting: Ophthalmology

## 2015-12-23 ENCOUNTER — Other Ambulatory Visit: Payer: Self-pay | Admitting: Cardiology

## 2016-01-26 ENCOUNTER — Other Ambulatory Visit: Payer: Self-pay | Admitting: Cardiology

## 2016-02-11 ENCOUNTER — Encounter: Payer: Self-pay | Admitting: Cardiology

## 2016-02-11 ENCOUNTER — Ambulatory Visit (INDEPENDENT_AMBULATORY_CARE_PROVIDER_SITE_OTHER): Payer: Medicare HMO | Admitting: Cardiology

## 2016-02-11 VITALS — BP 142/82 | HR 75 | Ht 64.5 in | Wt 147.8 lb

## 2016-02-11 DIAGNOSIS — I2510 Atherosclerotic heart disease of native coronary artery without angina pectoris: Secondary | ICD-10-CM

## 2016-02-11 DIAGNOSIS — I714 Abdominal aortic aneurysm, without rupture, unspecified: Secondary | ICD-10-CM

## 2016-02-11 DIAGNOSIS — E785 Hyperlipidemia, unspecified: Secondary | ICD-10-CM | POA: Diagnosis not present

## 2016-02-11 DIAGNOSIS — I10 Essential (primary) hypertension: Secondary | ICD-10-CM | POA: Diagnosis not present

## 2016-02-11 DIAGNOSIS — Z9861 Coronary angioplasty status: Secondary | ICD-10-CM

## 2016-02-11 DIAGNOSIS — I739 Peripheral vascular disease, unspecified: Secondary | ICD-10-CM | POA: Diagnosis not present

## 2016-02-11 DIAGNOSIS — I5032 Chronic diastolic (congestive) heart failure: Secondary | ICD-10-CM | POA: Diagnosis not present

## 2016-02-11 DIAGNOSIS — I951 Orthostatic hypotension: Secondary | ICD-10-CM

## 2016-02-11 MED ORDER — AMLODIPINE BESYLATE 2.5 MG PO TABS: ORAL_TABLET | ORAL | 3 refills | Status: DC

## 2016-02-11 MED ORDER — NITROGLYCERIN 0.4 MG SL SUBL: 0.4000 mg | SUBLINGUAL_TABLET | SUBLINGUAL | 4 refills | Status: AC | PRN

## 2016-02-11 MED ORDER — TRANDOLAPRIL 2 MG PO TABS: 2.0000 mg | ORAL_TABLET | Freq: Every day | ORAL | 3 refills | Status: DC

## 2016-02-11 NOTE — Progress Notes (Signed)
PCP: Drake Leach, MD  Clinic Note: Chief Complaint  Patient presents with  . Coronary Artery Disease  . PAD    HPI: [name] is a 82 y.o. female with a PMH below who presents today for annual follow-up of PAD and CAD-PCI.[name] She is a history of known coronary disease as well as PAD. She was seen by Dr. Gwenlyn Found in consultation, and had diamondback orbital rotational atherectomy with stenting of the ostial right common iliac artery. She also has a distant history of left iliac stent in 2007. Back in July 2015 she had symptoms concerning for crescendo class III angina and would cry catheterization. This showed diffuse severe in-stent-stenosis in the RCA but with brisk collaterals. Not change from prior and therefore medical management was recommended. She was started on amlodipine.   AMAZYN SUY was last seen in Nov 2016 - to discuss a Myoview stress test. Relatively low risk study. This is and evaluate fatigue and exertional dyspnea. At that time unable to walk her routine 2 miles a day. She still notices some shortness of breath and feeling tired but is overall better. Noted some lightheadedness and dizziness.  Recent Hospitalizations: None  Studies Reviewed: None  Interval History: [name] presents today overall doing pretty well. Her exertional dyspnea seems to have improved overall. She walks around using her walker. But mostly she uses a recumbent bike. She says her back hurts too much when she uses the walker. Moser exercise related the recumbent bike. Online occasion she'll notice some dizziness and lightheadedness, but nothing routine. Sometimes she works out in the yard, and will get somewhat tired and fatigued. She might get some tightness or chest she gets too tired. With routine activity, she denies chest pressure or dyspnea. No resting dyspnea or chest discomfort.   No PND, orthopnea or edema.  No palpitations, syncope/near syncope, or TIA/amaurosis fugax symptoms.Just  random off-and-dizziness  No melena, hematochezia, hematuria, or epstaxis. No claudication since her peripheral procedure. They did ask about AAA monitoring.  ROS: A comprehensive was performed. Review of Systems  Constitutional: Negative for malaise/fatigue and weight loss.  HENT: Negative for congestion and nosebleeds.   Respiratory: Negative for cough, shortness of breath and wheezing.   Cardiovascular: Positive for chest pain (Only if she really exerts herself and is tired).  Gastrointestinal: Negative for blood in stool, constipation and melena.  Musculoskeletal: Positive for back pain (Limits her ability to walk use a rolling walker.) and joint pain (Knees and hips).  Skin: Negative.   Neurological: Negative for headaches. Dizziness: Intermittent.  Endo/Heme/Allergies: Negative for environmental allergies.  Psychiatric/Behavioral: Negative for depression and memory loss. The patient has insomnia (Wakes up every so often then just cannot go back sleep again.). The patient is not nervous/anxious.   All other systems reviewed and are negative.   Past Medical History:  Diagnosis Date  . Abdominal aortic aneurysm ([place])    asymptomatic, 2012 -  2.6 x 2.4cm  . Anxiety   . CAD S/P percutaneous coronary angioplasty    Cath 2010: 100% occlusion of RCA  (2 BMS stents finally occluded) w/ left to right collaterals; moderate LAD and circumflex disease.  EF 55-60%;; cath 2015 with medical therapy recommended   . Chronic lower back pain   . Daily headache       . Depression   . Exertional shortness of breath   . History of stomach ulcers   . Hyperlipidemia   . Hypertension   . Hypothyroidism   .  Macular degeneration    "started out dry; now is wet" (01/08/2013)  . Migraines       . Myocardial infarction 2003  . PAD (peripheral artery disease) ([name]) 12/2005   s/p L Common Iliac Stent  . PONV (postoperative nausea and vomiting)   . Walking pneumonia 1970's    Past Surgical History:    Procedure Laterality Date  . ABD AOTRA DOPPLER  08/14/2012   distal mild dilatation 2.6 x 2.5cm unchanged;right cia 70-99%  new finding  . Abdominal Aorta Duplex  09/2014   No change from prior study; 2.6 x 2.4 cm mild AAA. R Com Iliac A  > 50%. -- f/u 24 months.  . ABDOMINAL AORTAGRAM  01/08/2013   Procedure: ABDOMINAL Maxcine Ham;  Surgeon: Lorretta Harp, MD;  Location: [facility] CATH LAB;  Service: Cardiovascular;;  . ANGIOPLASTY Right 01/08/2013   unsucess stenting RLE   . APPENDECTOMY    . CARDIAC CATHETERIZATION  11/2008   diffuse ISR in  RCA w/ good L-R collaterals; AV groove Cx - 60-70% stenosis with post stenosis ectasia;  EF 55%, mild  CAD in LAD -- Med Rx  . CATARACT EXTRACTION W/ INTRAOCULAR LENS  IMPLANT, BILATERAL Bilateral   . CHOLECYSTECTOMY    . CORONARY ANGIOPLASTY  April 2006   Cutting Balloon PTCA of RCA ISR  . CORONARY ANGIOPLASTY WITH STENT PLACEMENT  Jan 2004   RCA - 2 overlapping Pixel BMS 2.0 mm x 18 mm & 2.0 mm x 23 m   . DILATION AND CURETTAGE OF UTERUS     "couple over the years" (01/08/2013)  . ILIAC ARTERY STENT Left 12/30/2005   PTA anddirect stenting  of left common iliac artery - 8.0 x 18 mm OmniLink stent  . ILIAC ARTERY STENT Right 02/13/2013   successful diamondback orbital rotation lipectomy, PTA and stenting of highly calcified ostial common iliac artery/notes 02/13/2013  . LEFT HEART CATHETERIZATION WITH CORONARY ANGIOGRAM N/A 11/13/2013   Procedure: LEFT HEART CATHETERIZATION WITH CORONARY ANGIOGRAM;  Surgeon: Leonie Man, MD; LAD 40%, CFX 60%, OM1 60%, RCA diffuse in-stent restenosis up to 99%, small PDA and PLA, collaterals from left system noted, EF 55-60 percent  . LOWER EXTREMITY ANGIOGRAM N/A 01/08/2013   Procedure: LOWER EXTREMITY ANGIOGRAM;  Surgeon: Lorretta Harp, MD;  Location[facility] C[facility] LAB;  Service: Cardiovascular;  Laterality: N/A;  . NM MYOCAR PERF WALL MOTION  12/27/2011 -[place]   LV normal,EF 81 %  . NM MYOCAR PERF WALL MOTION   01/2015   EF 56%. Normal ROM of low risk. Small defect of mild severity and apical anterior location likely related to breast attenuation. Cannot rule out small area of mild ischemia.  . TRANSTHORACIC ECHOCARDIOGRAM  10/2013   Normal LV size low normal function. EF 50-55%. No regional wall motion abnormality. GR 1 DD. PAP ~ 47 mmHg    Prior to Admission medications   Medication Sig Start Date End Date Taking? Authorizing Provider  ALPRAZolam Duanne Moron) 0.5 MG tablet Take 0.5 mg by mouth daily as needed for anxiety.   Yes Historical Provider, MD  amLODipine (NORVASC) 2.5 MG tablet TAKE 1 TABLET EVERY DAY 02/11/16  Yes Leonie Man, MD  aspirin 81 MG tablet Take 81 mg by mouth daily.   Yes Historical Provider, MD  glucosamine-chondroitin 500-400 MG tablet Take 1 tablet by mouth 2 (two) times daily.   Yes Historical Provider, MD  levothyroxine (SYNTHROID, LEVOTHROID) 100 MCG tablet Take 100 mcg by mouth daily. 10/29/14  Yes Historical Provider, MD  nitroGLYCERIN (NITROSTAT) 0.4 MG SL tablet Place 1 tablet (0.4 mg total) under the tongue every 5 (five) minutes as needed for chest pain. 02/11/16  Yes Leonie Man, MD  ranitidine (ZANTAC) 150 MG tablet Take 150 mg by mouth as directed.   Yes Historical Provider, MD  sertraline (ZOLOFT) 25 MG tablet Take 25 mg by mouth daily.   Yes Historical Provider, MD  trandolapril (MAVIK) 2 MG tablet Take 1 tablet (2 mg total) by mouth daily. 02/11/16  Yes Leonie Man, MD   Allergies  Allergen Reactions  . Benadryl [Diphenhydramine] Palpitations    Racing heart Heart race  . Levofloxacin Other (See Comments)    And myalgias  . Avastin [Bevacizumab]     Muscle pain  . Beta Adrenergic Blockers Other (See Comments)    fatigue  . Hydrocodone-Acetaminophen Nausea And Vomiting    Delirium  . Other     Other reaction(s): Myalgias (intolerance) Statin-muscle pain Statin-muscle pain  . Trazodone Other (See Comments)     Per daughter in law, terrible  SE    Social History   Social History  . Marital status: Widowed    Spouse name: N/A  . Number of children: N/A  . Years of education: N/A   Social History Main Topics  . Smoking status: Former Smoker    Packs/day: 1.00    Years: 40.00    Types: Cigarettes    Quit date: 09/18/1987  . Smokeless tobacco: Never Used  . Alcohol use No  . Drug use: No  . Sexual activity: Not Currently   Other Topics Concern  . None   Social History Narrative   She is a widowed mother of 53, grandmother of 52. She'd like to try to exercise at least 2 times a week doing elliptical trainer about 20 minutes at a time. But she's noticed that she's not able to do as much as she used to mostly due to the hip and buttock pain.   History reviewed. No pertinent family history.  Wt Readings from Last 3 Encounters:  02/11/16 67 kg (147 lb 12.8 oz)  03/10/15 65.8 kg (145 lb)  01/21/15 67.1 kg (148 lb)    PHYSICAL EXAM BP (!) 142/82   Pulse 75   Ht 5' 4.5" (1.638 m)   Wt 67 kg (147 lb 12.8 oz)   BMI 24.98 kg/m  GEN: Well nourished, well developed, in no acute distress  HEENT: normal  Neck: no JVD, carotid bruits, or masses Cardiac: RRR; no murmurs, rubs, or gallops,no edema  Respiratory: clear to auscultation bilaterally, normal work of breathing GI: soft, nontender, nondistended, + BS MS: no deformity or atrophy; distal pulses are 2+ in all 4 extremities  Skin: warm and dry, no rash Neuro: Strength and sensation are intact Psych: euthymic mood, full affect    Adult ECG Report  Rate: 75 ;  Rhythm: normal sinus rhythm and LAA, NS ST-T changes. Otherwise normal axis, intervals and durations.;   Narrative Interpretation: Stable EKG with no PACs or PVCs noted.  Other studies Reviewed: Additional studies/ records that were reviewed today include:  Recent Labs:  Followed by PCP   ASSESSMENT / PLAN: Problem List Items Addressed This Visit    Orthostatic hypotension (Chronic)    We are now  allowing for mild permissive hypertension. Less symptomatic having reduced his dose as well as having stopped Imdur      Relevant Medications   trandolapril (MAVIK) 2 MG tablet  amLODipine (NORVASC) 2.5 MG tablet   nitroGLYCERIN (NITROSTAT) 0.4 MG SL tablet   Hyperlipidemia with target LDL less than 70 (Chronic)    We stopped atorvastatin probably because of fatigue in the past. Labs being monitored by PCP. I have not seen recent results.      Relevant Medications   trandolapril (MAVIK) 2 MG tablet   amLODipine (NORVASC) 2.5 MG tablet   nitroGLYCERIN (NITROSTAT) 0.4 MG SL tablet   Other Relevant Orders   EKG 12-Lead (Completed)   Essential hypertension - Primary (Chronic)    Allowing for permissive hypertension. On amlodipine and ACE inhibitor for afterload reduction.      Relevant Medications   trandolapril (MAVIK) 2 MG tablet   amLODipine (NORVASC) 2.5 MG tablet   nitroGLYCERIN (NITROSTAT) 0.4 MG SL tablet   Other Relevant Orders   EKG 12-Lead (Completed)   Claudication in peripheral vascular disease (HCC)    Stable following angioplasty. We will follow-up motion me Dopplers when she has her abdominal aortic Doppler next year.      Relevant Medications   trandolapril (MAVIK) 2 MG tablet   amLODipine (NORVASC) 2.5 MG tablet   nitroGLYCERIN (NITROSTAT) 0.4 MG SL tablet   Chronic diastolic heart failure, NYHA class 1 (HCC) (Chronic)    Relatively stable. Afterload reduction with ACE inhibitor is limited by some orthostatic dizziness and fatigue. Last visit I decreased her ACE inhibitor.      Relevant Medications   trandolapril (MAVIK) 2 MG tablet   amLODipine (NORVASC) 2.5 MG tablet   nitroGLYCERIN (NITROSTAT) 0.4 MG SL tablet   Other Relevant Orders   EKG 12-Lead (Completed)   CAD S/P percutaneous coronary angioplasty (Chronic)    No recurrent anginal symptoms. Back to her baseline level activity. Last Myoview every year ago was pretty much low risk.  Refill  nitroglycerin.  Not on statin She is on aspirin, ACE inhibitor and calcium channel blocker. Not on beta blocker due to history of fatigue. No change in regimen.       Relevant Medications   trandolapril (MAVIK) 2 MG tablet   amLODipine (NORVASC) 2.5 MG tablet   nitroGLYCERIN (NITROSTAT) 0.4 MG SL tablet   Abdominal aortic aneurysm (HCC) (Chronic)    Last Doppler was stable. Due to follow-up next June      Relevant Medications   trandolapril (MAVIK) 2 MG tablet   amLODipine (NORVASC) 2.5 MG tablet   nitroGLYCERIN (NITROSTAT) 0.4 MG SL tablet    Other Visit Diagnoses   None.     Current medicines are reviewed at length with the patient today. (+/- concerns) n/a The following changes have been made: see below.  Patient Instructions  No change in current medications Medications were refilled for year    Your physician wants you to follow-up in: 12 months with Dr  Ellyn Hack. You will receive a reminder letter in the mail two months in advance. If you don't receive a letter, please call our office to schedule the follow-up appointment.   If you need a refill on your cardiac medications before your next appointment, please call your pharmacy.    Studies Ordered:   Orders Placed This Encounter  Procedures  . EKG 12-Lead      Glenetta Hew, M.D., M.S. Interventional Cardiologist   Pager # [phone] Phone # [phone] [address] [place], [place] [address]

## 2016-02-11 NOTE — Patient Instructions (Signed)
No change in current medications Medications were refilled for year    Your physician wants you to follow-up in: 12 months with Dr  Ellyn Hack. You will receive a reminder letter in the mail two months in advance. If you don't receive a letter, please call our office to schedule the follow-up appointment.   If you need a refill on your cardiac medications before your next appointment, please call your pharmacy.

## 2016-02-13 ENCOUNTER — Encounter: Payer: Self-pay | Admitting: Cardiology

## 2016-02-13 NOTE — Assessment & Plan Note (Addendum)
Stable following angioplasty. We will follow-up motion me Dopplers when she has her abdominal aortic Doppler next year.

## 2016-02-13 NOTE — Assessment & Plan Note (Signed)
We are now allowing for mild permissive hypertension. Less symptomatic having reduced his dose as well as having stopped Imdur

## 2016-02-13 NOTE — Assessment & Plan Note (Signed)
We stopped atorvastatin probably because of fatigue in the past. Labs being monitored by PCP. I have not seen recent results.

## 2016-02-13 NOTE — Assessment & Plan Note (Signed)
Last Doppler was stable. Due to follow-up next June

## 2016-02-13 NOTE — Assessment & Plan Note (Signed)
Allowing for permissive hypertension. On amlodipine and ACE inhibitor for afterload reduction.

## 2016-02-13 NOTE — Assessment & Plan Note (Signed)
Relatively stable. Afterload reduction with ACE inhibitor is limited by some orthostatic dizziness and fatigue. Last visit I decreased her ACE inhibitor.

## 2016-02-13 NOTE — Assessment & Plan Note (Signed)
No recurrent anginal symptoms. Back to her baseline level activity. Last Myoview every year ago was pretty much low risk.  Refill nitroglycerin.  Not on statin She is on aspirin, ACE inhibitor and calcium channel blocker. Not on beta blocker due to history of fatigue. No change in regimen.

## 2016-04-02 ENCOUNTER — Emergency Department (HOSPITAL_COMMUNITY): Payer: Medicare HMO

## 2016-04-02 ENCOUNTER — Encounter (HOSPITAL_COMMUNITY): Payer: Self-pay

## 2016-04-02 ENCOUNTER — Emergency Department (HOSPITAL_COMMUNITY)
Admission: EM | Admit: 2016-04-02 | Discharge: 2016-04-03 | Disposition: A | Discharge status: Home / Self Care | Payer: Medicare HMO | Attending: Emergency Medicine | Admitting: Emergency Medicine

## 2016-04-02 DIAGNOSIS — Z87891 Personal history of nicotine dependence: Secondary | ICD-10-CM | POA: Insufficient documentation

## 2016-04-02 DIAGNOSIS — R1031 Right lower quadrant pain: Secondary | ICD-10-CM | POA: Diagnosis not present

## 2016-04-02 DIAGNOSIS — E039 Hypothyroidism, unspecified: Secondary | ICD-10-CM | POA: Insufficient documentation

## 2016-04-02 DIAGNOSIS — Z7982 Long term (current) use of aspirin: Secondary | ICD-10-CM | POA: Insufficient documentation

## 2016-04-02 DIAGNOSIS — I10 Essential (primary) hypertension: Secondary | ICD-10-CM | POA: Insufficient documentation

## 2016-04-02 DIAGNOSIS — Z79899 Other long term (current) drug therapy: Secondary | ICD-10-CM | POA: Insufficient documentation

## 2016-04-02 DIAGNOSIS — Z955 Presence of coronary angioplasty implant and graft: Secondary | ICD-10-CM | POA: Insufficient documentation

## 2016-04-02 DIAGNOSIS — R0789 Other chest pain: Secondary | ICD-10-CM | POA: Insufficient documentation

## 2016-04-02 DIAGNOSIS — J189 Pneumonia, unspecified organism: Secondary | ICD-10-CM | POA: Diagnosis not present

## 2016-04-02 DIAGNOSIS — R0600 Dyspnea, unspecified: Secondary | ICD-10-CM

## 2016-04-02 DIAGNOSIS — R079 Chest pain, unspecified: Secondary | ICD-10-CM | POA: Diagnosis present

## 2016-04-02 DIAGNOSIS — I2510 Atherosclerotic heart disease of native coronary artery without angina pectoris: Secondary | ICD-10-CM | POA: Diagnosis not present

## 2016-04-02 LAB — URINALYSIS, ROUTINE W REFLEX MICROSCOPIC
Bacteria, UA: NONE SEEN
Bilirubin Urine: NEGATIVE
GLUCOSE, UA: NEGATIVE mg/dL
Hgb urine dipstick: NEGATIVE
KETONES UR: NEGATIVE mg/dL
Nitrite: NEGATIVE
PH: 5 (ref 5.0–8.0)
PROTEIN: NEGATIVE mg/dL
Specific Gravity, Urine: 1.018 (ref 1.005–1.030)

## 2016-04-02 LAB — COMPREHENSIVE METABOLIC PANEL
ALBUMIN: 3.6 g/dL (ref 3.5–5.0)
ALT: 21 U/L (ref 14–54)
ANION GAP: 8 (ref 5–15)
AST: 27 U/L (ref 15–41)
Alkaline Phosphatase: 63 U/L (ref 38–126)
BILIRUBIN TOTAL: 0.3 mg/dL (ref 0.3–1.2)
BUN: 15 mg/dL (ref 6–20)
CO2: 24 mmol/L (ref 22–32)
Calcium: 9.4 mg/dL (ref 8.9–10.3)
Chloride: 106 mmol/L (ref 101–111)
Creatinine, Ser: 0.7 mg/dL (ref 0.44–1.00)
GFR calc Af Amer: >60 mL/min (ref >60)
GFR calc non Af Amer: >60 mL/min (ref >60)
GLUCOSE: 104 mg/dL — AB (ref 65–99)
POTASSIUM: 3.6 mmol/L (ref 3.5–5.1)
Sodium: 138 mmol/L (ref 135–145)
Total Protein: 6.3 g/dL — ABNORMAL LOW (ref 6.5–8.1)

## 2016-04-02 LAB — CBC
HEMATOCRIT: 39.8 % (ref 36.0–46.0)
HEMOGLOBIN: 13.5 g/dL (ref 12.0–15.0)
MCH: 32.8 pg (ref 26.0–34.0)
MCHC: 33.9 g/dL (ref 30.0–36.0)
MCV: 96.8 fL (ref 78.0–100.0)
Platelets: 157 10*3/uL (ref 150–400)
RBC: 4.11 MIL/uL (ref 3.87–5.11)
RDW: 12.6 % (ref 11.5–15.5)
WBC: 7.5 10*3/uL (ref 4.0–10.5)

## 2016-04-02 LAB — I-STAT TROPONIN, ED: Troponin i, poc: 0 ng/mL (ref 0.00–0.08)

## 2016-04-02 LAB — LIPASE, BLOOD: Lipase: 35 U/L (ref 11–51)

## 2016-04-02 LAB — D-DIMER, QUANTITATIVE: D-Dimer, Quant: 0.51 ug/mL-FEU — ABNORMAL HIGH (ref 0.00–0.50)

## 2016-04-02 MED ORDER — IOPAMIDOL (ISOVUE-370) INJECTION 76%
INTRAVENOUS | Status: AC
  Administered 2016-04-02: 100 mL
  Filled 2016-04-02: qty 100

## 2016-04-02 NOTE — ED Provider Notes (Signed)
[facility] DEPT Provider Note   CSN: [id number] Arrival date & time: 04/02/16  1810     History   Chief Complaint Chief Complaint  Patient presents with  . Chest Pain  . Shortness of Breath    HPI [name] is a 82 y.o. female.  HPI  Pt presents with c/o shortness of breath, abdominal pain and lower chest pain.  She states she had nausea and vomtiing with diarrhea earlier in the week. She has been drinking fluids well for the past 2 days.  Starting last night she feels that she is having a hard time taking a deep breath. She describes pain in lower anterior chest when she takes a deep breath.  No fever, no cough.  She also c/o bloating in her stomach and passing a lot of gas today.  She was seen at [facility] urgent care and found to have some right sided abdominal tenderness as well.  She was brought to the ED via EMS for further evaluation.   She was given ASA and nitro x 1 prior to arrvival.  There are no other associated systemic symptoms, there are no other alleviating or modifying factors.   Past Medical History:  Diagnosis Date  . Abdominal aortic aneurysm ([place])    asymptomatic, 2012 -  2.6 x 2.4cm  . Anxiety   . CAD S/P percutaneous coronary angioplasty    Cath 2010: 100% occlusion of RCA  (2 BMS stents finally occluded) w/ left to right collaterals; moderate LAD and circumflex disease.  EF 55-60%;; cath 2015 with medical therapy recommended   . Chronic lower back pain   . Daily headache       . Depression   . Exertional shortness of breath   . History of stomach ulcers   . Hyperlipidemia   . Hypertension   . Hypothyroidism   . Macular degeneration    "started out dry; now is wet" (01/08/2013)  . Migraines       . Myocardial infarction 2003  . PAD (peripheral artery disease) ([place]) 12/2005   s/p L Common Iliac Stent  . PONV (postoperative nausea and vomiting)   . Walking pneumonia 1970's    Patient Active Problem List   Diagnosis Date Noted  . Orthostatic  hypotension 03/12/2015  . Claudication in peripheral vascular disease ([place]) 02/14/2013  . GERD (gastroesophageal reflux disease) 11/25/2012  . PAD - s/p diamondback orbital rotation atherectomy, PTA + stenting of calcified ostical right CIA- 02/13/13   . CAD S/P percutaneous coronary angioplasty   . Abdominal aortic aneurysm ([place])   . PULMONARY NODULE 09/10/2008  . HYPOTHYROIDISM 01/16/2007  . Hyperlipidemia with target LDL less than 70 01/16/2007  . ANXIETY 01/16/2007  . Essential hypertension 01/16/2007  . Chronic diastolic heart failure, NYHA class 1 ([place]) 01/16/2007  . MENOPAUSAL SYNDROME 01/16/2007    Past Surgical History:  Procedure Laterality Date  . ABD AOTRA DOPPLER  08/14/2012   distal mild dilatation 2.6 x 2.5cm unchanged;right cia 70-99%  new finding  . Abdominal Aorta Duplex  09/2014   No change from prior study; 2.6 x 2.4 cm mild AAA. R Com Iliac A  > 50%. -- f/u 24 months.  . ABDOMINAL AORTAGRAM  01/08/2013   Procedure: ABDOMINAL Maxcine Ham;  Surgeon: Lorretta Harp, MD;  Location: [facility] CATH LAB;  Service: Cardiovascular;;  . ANGIOPLASTY Right 01/08/2013   unsucess stenting RLE   . APPENDECTOMY    . CARDIAC CATHETERIZATION  11/2008   diffuse ISR in  RCA w/ good L-R collaterals; AV groove Cx - 60-70% stenosis with post stenosis ectasia;  EF 55%, mild  CAD in LAD -- Med Rx  . CATARACT EXTRACTION W/ INTRAOCULAR LENS  IMPLANT, BILATERAL Bilateral   . CHOLECYSTECTOMY    . CORONARY ANGIOPLASTY  April 2006   Cutting Balloon PTCA of RCA ISR  . CORONARY ANGIOPLASTY WITH STENT PLACEMENT  Jan 2004   RCA - 2 overlapping Pixel BMS 2.0 mm x 18 mm & 2.0 mm x 23 m   . DILATION AND CURETTAGE OF UTERUS     "couple over the years" (01/08/2013)  . ILIAC ARTERY STENT Left 12/30/2005   PTA anddirect stenting  of left common iliac artery - 8.0 x 18 mm OmniLink stent  . ILIAC ARTERY STENT Right 02/13/2013   successful diamondback orbital rotation lipectomy, PTA and stenting of highly  calcified ostial common iliac artery/notes 02/13/2013  . LEFT HEART CATHETERIZATION WITH CORONARY ANGIOGRAM N/A 11/13/2013   Procedure: LEFT HEART CATHETERIZATION WITH CORONARY ANGIOGRAM;  Surgeon: Leonie Man, MD; LAD 40%, CFX 60%, OM1 60%, RCA diffuse in-stent restenosis up to 99%, small PDA and PLA, collaterals from left system noted, EF 55-60 percent  . LOWER EXTREMITY ANGIOGRAM N/A 01/08/2013   Procedure: LOWER EXTREMITY ANGIOGRAM;  Surgeon: Lorretta Harp, MD;  Location: [facility] CATH LAB;  Service: Cardiovascular;  Laterality: N/A;  . NM MYOCAR PERF WALL MOTION  12/27/2011 -[place]   LV normal,EF 81 %  . NM MYOCAR PERF WALL MOTION  01/2015   EF 56%. Normal ROM of low risk. Small defect of mild severity and apical anterior location likely related to breast attenuation. Cannot rule out small area of mild ischemia.  . TRANSTHORACIC ECHOCARDIOGRAM  10/2013   Normal LV size low normal function. EF 50-55%. No regional wall motion abnormality. GR 1 DD. PAP ~ 47 mmHg    OB History    No data available       Home Medications    Prior to Admission medications   Medication Sig Start Date End Date Taking? Authorizing Provider  ALPRAZolam Duanne Moron) 0.5 MG tablet Take 0.5 mg by mouth daily as needed for anxiety.   Yes Historical Provider, MD  amLODipine (NORVASC) 2.5 MG tablet TAKE 1 TABLET EVERY DAY 02/11/16  Yes Leonie Man, MD  aspirin 81 MG tablet Take 81 mg by mouth daily.   Yes Historical Provider, MD  glucosamine-chondroitin 500-400 MG tablet Take 1 tablet by mouth 2 (two) times daily.   Yes Historical Provider, MD  levothyroxine (SYNTHROID, LEVOTHROID) 100 MCG tablet Take 100 mcg by mouth daily. 10/29/14  Yes Historical Provider, MD  nitroGLYCERIN (NITROSTAT) 0.4 MG SL tablet Place 1 tablet (0.4 mg total) under the tongue every 5 (five) minutes as needed for chest pain. 02/11/16  Yes Leonie Man, MD  ranitidine (ZANTAC) 150 MG tablet Take 150 mg by mouth as directed.   Yes  Historical Provider, MD  sertraline (ZOLOFT) 25 MG tablet Take 25 mg by mouth daily.   Yes Historical Provider, MD  trandolapril (MAVIK) 2 MG tablet Take 1 tablet (2 mg total) by mouth daily. 02/11/16  Yes Leonie Man, MD  amoxicillin-clavulanate (AUGMENTIN) 875-125 MG tablet Take 1 tablet by mouth every 12 (twelve) hours. 04/03/16   Alfonzo Beers, MD  azithromycin (ZITHROMAX Z-PAK) 250 MG tablet Take 1 tablet (250 mg total) by mouth daily. 04/03/16   Alfonzo Beers, MD    Family History No family history on file.  Social  History Social History  Substance Use Topics  . Smoking status: Former Smoker    Packs/day: 1.00    Years: 40.00    Types: Cigarettes    Quit date: 09/18/1987  . Smokeless tobacco: Never Used  . Alcohol use No     Allergies   Benadryl [diphenhydramine]; Levofloxacin; Avastin [bevacizumab]; Beta adrenergic blockers; Hydrocodone-acetaminophen; Other; and Trazodone   Review of Systems Review of Systems  ROS reviewed and all otherwise negative except for mentioned in HPI   Physical Exam Updated Vital Signs BP 143/68   Pulse 69   Temp 98.2 F (36.8 C) (Oral)   Resp 17   SpO2 96%  Vitals reviewed Physical Exam Physical Examination: General appearance - alert, well appearing, and in no distress Mental status - alert, oriented to person, place, and time Eyes - no conjunctival injection, no scleral icterus Mouth - mucous membranes moist, pharynx normal without lesions Neck - supple, no significant adenopathy Chest - clear to auscultation, no wheezes, rales or rhonchi, symmetric air entry Heart - normal rate, regular rhythm, normal S1, S2, no murmurs, rubs, clicks or gallops Abdomen - soft, ttp in right lower abdomen, no gaurding or rebound tenderness, nondistended, no masses or organomegaly Neurological - alert, oriented, normal speech Extremities - peripheral pulses normal, no pedal edema, no clubbing or cyanosis Skin - normal coloration and turgor, no  rashes  ED Treatments / Results  Labs (all labs ordered are listed, but only abnormal results are displayed) Labs Reviewed  URINALYSIS, ROUTINE W REFLEX MICROSCOPIC - Abnormal; Notable for the following:       Result Value   APPearance HAZY (*)    Leukocytes, UA LARGE (*)    Squamous Epithelial / LPF 0-5 (*)    Non Squamous Epithelial 0-5 (*)    All other components within normal limits  D-DIMER, QUANTITATIVE (NOT AT [facility]) - Abnormal; Notable for the following:    D-Dimer, Quant 0.51 (*)    All other components within normal limits  COMPREHENSIVE METABOLIC PANEL - Abnormal; Notable for the following:    Glucose, Bld 104 (*)    Total Protein 6.3 (*)    All other components within normal limits  URINE CULTURE  CBC  LIPASE, BLOOD  I-STAT TROPOININ, ED  I-STAT TROPOININ, ED    EKG  EKG Interpretation  Date/Time:  Friday April 02 2016 18:24:46 EST Ventricular Rate:  82 PR Interval:    QRS Duration: 83 QT Interval:  387 QTC Calculation: 452 R Axis:   61 Text Interpretation:  Sinus rhythm Multiple ventricular premature complexes Probable left atrial enlargement Minimal ST depression, lateral leads No significant change since last tracing Confirmed by Canary Brim  MD, Murrel Bertram [phone]) on 04/02/2016 7:47:20 PM       Radiology Ct Angio Chest Pe W Or Wo Contrast  Result Date: 04/03/2016 CLINICAL DATA:  Chest pain and shortness of breath, mid abdominal pain EXAM: CT ANGIOGRAPHY CHEST CT ABDOMEN AND PELVIS WITH CONTRAST TECHNIQUE: Multidetector CT imaging of the chest was performed using the standard protocol during bolus administration of intravenous contrast. Multiplanar CT image reconstructions and MIPs were obtained to evaluate the vascular anatomy. Multidetector CT imaging of the abdomen and pelvis was performed using the standard protocol during bolus administration of intravenous contrast. CONTRAST:  100 cc Isovue 370 intravenous COMPARISON:  08/26/2008, radiographs 04/02/2016, CT  abdomen pelvis 01/30/2003, MRI abdomen 01/02/2011 FINDINGS: CTA CHEST FINDINGS Cardiovascular: Satisfactory opacification of the pulmonary arteries to the segmental level. No evidence of pulmonary embolism. Heart  is nonenlarged. No significant pericardial effusion. Extensive coronary artery calcifications. There is ectatic appearance of the aorta with dense calcifications. Inadequate contrast evaluate for dissection. Mediastinum/Nodes: Nonspecific subcentimeter mediastinal lymph nodes. No hilar adenopathy. Trachea is midline. Esophagus is within normal limits. Lungs/Pleura: No pleural effusion. Mild emphysematous disease greatest in the upper lobes. Bandlike atelectasis or scar in the left upper lobe. Mild consolidation within the anterior aspect of left lower lobe. Small calcified granulomas. Musculoskeletal: Scoliosis of the spine. No acute osseous abnormality. Review of the MIP images confirms the above findings. CT ABDOMEN and PELVIS FINDINGS Hepatobiliary: There is fatty infiltration of the liver. There is no focal hepatic abnormality. The patient is status post cholecystectomy. No intra hepatic biliary dilatation. Prominent extrahepatic common bile duct as before. Pancreas: Unremarkable. No pancreatic ductal dilatation or surrounding inflammatory changes. Spleen: Normal in size without focal abnormality. Adrenals/Urinary Tract: Adrenal glands are within normal limits. Increased size of a cyst in the upper pole left kidney, now measuring 6.2 cm. Additional 1.4 cm cyst midpole left kidney. Bilateral intrarenal calculi, multiple. Dominant calcification on the left measures 5 mm and is in the upper pole. The bladder is unremarkable. Stomach/Bowel: The stomach is nonenlarged. No dilated small bowel to suggest obstruction. Numerous sigmoid colon diverticula without acute wall inflammation. The appendix is not well identified but no right lower quadrant inflammatory process is visualized. Vascular/Lymphatic:  Extensive atherosclerotic vascular calcification. Focally ectatic infrarenal aorta measuring 2.6 cm maximal diameter, just above the iliac bifurcation. No significantly enlarged lymph nodes. Reproductive: Status post hysterectomy. No adnexal masses. Other: No free air or free fluid. Musculoskeletal: No acute osseous abnormality. Degenerative changes of the spine. Review of the MIP images confirms the above findings. IMPRESSION: 1. No CT evidence for acute pulmonary embolus 2. Partial consolidation within the anterior aspect of left lower lobe could relate to atelectasis or mild pneumonia. There are mild emphysematous changes bilaterally. Evidence of calcified granulomas. 3. Fatty infiltration of the liver. 4. Left renal cyst. Bilateral intrarenal calculi with no hydronephrosis. 5. Focal ectasia of the distal infrarenal abdominal aorta, measuring 2.6 cm in maximum diameter. Ectatic abdominal aorta at risk for aneurysm development. Recommend followup by ultrasound in 5 years. This recommendation follows ACR consensus guidelines: White Paper of the ACR Incidental Findings Committee II on Vascular Findings. J Am Coll Radiol 2013; 10:789-794. Electronically Signed   By: Donavan Foil M.D.   On: 04/03/2016 00:04   Ct Abdomen Pelvis W Contrast  Result Date: 04/03/2016 CLINICAL DATA:  Chest pain and shortness of breath, mid abdominal pain EXAM: CT ANGIOGRAPHY CHEST CT ABDOMEN AND PELVIS WITH CONTRAST TECHNIQUE: Multidetector CT imaging of the chest was performed using the standard protocol during bolus administration of intravenous contrast. Multiplanar CT image reconstructions and MIPs were obtained to evaluate the vascular anatomy. Multidetector CT imaging of the abdomen and pelvis was performed using the standard protocol during bolus administration of intravenous contrast. CONTRAST:  100 cc Isovue 370 intravenous COMPARISON:  08/26/2008, radiographs 04/02/2016, CT abdomen pelvis 01/30/2003, MRI abdomen 01/02/2011  FINDINGS: CTA CHEST FINDINGS Cardiovascular: Satisfactory opacification of the pulmonary arteries to the segmental level. No evidence of pulmonary embolism. Heart is nonenlarged. No significant pericardial effusion. Extensive coronary artery calcifications. There is ectatic appearance of the aorta with dense calcifications. Inadequate contrast evaluate for dissection. Mediastinum/Nodes: Nonspecific subcentimeter mediastinal lymph nodes. No hilar adenopathy. Trachea is midline. Esophagus is within normal limits. Lungs/Pleura: No pleural effusion. Mild emphysematous disease greatest in the upper lobes. Bandlike atelectasis or scar in the  left upper lobe. Mild consolidation within the anterior aspect of left lower lobe. Small calcified granulomas. Musculoskeletal: Scoliosis of the spine. No acute osseous abnormality. Review of the MIP images confirms the above findings. CT ABDOMEN and PELVIS FINDINGS Hepatobiliary: There is fatty infiltration of the liver. There is no focal hepatic abnormality. The patient is status post cholecystectomy. No intra hepatic biliary dilatation. Prominent extrahepatic common bile duct as before. Pancreas: Unremarkable. No pancreatic ductal dilatation or surrounding inflammatory changes. Spleen: Normal in size without focal abnormality. Adrenals/Urinary Tract: Adrenal glands are within normal limits. Increased size of a cyst in the upper pole left kidney, now measuring 6.2 cm. Additional 1.4 cm cyst midpole left kidney. Bilateral intrarenal calculi, multiple. Dominant calcification on the left measures 5 mm and is in the upper pole. The bladder is unremarkable. Stomach/Bowel: The stomach is nonenlarged. No dilated small bowel to suggest obstruction. Numerous sigmoid colon diverticula without acute wall inflammation. The appendix is not well identified but no right lower quadrant inflammatory process is visualized. Vascular/Lymphatic: Extensive atherosclerotic vascular calcification. Focally  ectatic infrarenal aorta measuring 2.6 cm maximal diameter, just above the iliac bifurcation. No significantly enlarged lymph nodes. Reproductive: Status post hysterectomy. No adnexal masses. Other: No free air or free fluid. Musculoskeletal: No acute osseous abnormality. Degenerative changes of the spine. Review of the MIP images confirms the above findings. IMPRESSION: 1. No CT evidence for acute pulmonary embolus 2. Partial consolidation within the anterior aspect of left lower lobe could relate to atelectasis or mild pneumonia. There are mild emphysematous changes bilaterally. Evidence of calcified granulomas. 3. Fatty infiltration of the liver. 4. Left renal cyst. Bilateral intrarenal calculi with no hydronephrosis. 5. Focal ectasia of the distal infrarenal abdominal aorta, measuring 2.6 cm in maximum diameter. Ectatic abdominal aorta at risk for aneurysm development. Recommend followup by ultrasound in 5 years. This recommendation follows ACR consensus guidelines: White Paper of the ACR Incidental Findings Committee II on Vascular Findings. J Am Coll Radiol 2013; 10:789-794. Electronically Signed   By: Donavan Foil M.D.   On: 04/03/2016 00:04   Dg Abdomen Acute W/chest  Result Date: 04/02/2016 CLINICAL DATA:  Chest tightness and shortness of breath for the past 36 hours. EXAM: DG ABDOMEN ACUTE W/ 1V CHEST COMPARISON:  11/07/2013. FINDINGS: Normal sized heart. Interval linear density at the left lung base. The remainder of the lungs are clear. Aortic arch calcifications. Mild to moderate scoliosis. Normal bowel gas pattern without free peritoneal air. Cholecystectomy clips. Bilateral proximal common iliac artery stents. Diffuse osteopenia. IMPRESSION: 1. Interval linear atelectasis or scarring at the left lung base. 2. No acute abdominal abnormality. Electronically Signed   By: Claudie Revering M.D.   On: 04/02/2016 20:07    Procedures Procedures (including critical care time)  Medications Ordered in  ED Medications  amoxicillin-clavulanate (AUGMENTIN) 875-125 MG per tablet 1 tablet (not administered)  azithromycin (ZITHROMAX) tablet 500 mg (not administered)  iopamidol (ISOVUE-370) 76 % injection (100 mLs  Contrast Given 04/02/16 2300)     Initial Impression / Assessment and Plan / ED Course  I have reviewed the triage vital signs and the nursing notes.  Pertinent labs & imaging results that were available during my care of the patient were reviewed by me and considered in my medical decision making (see chart for details).  Clinical Course   12:13 AM  Pt with elevated D-dimer and lower abdominal pain-CT scan shows no PE- very mild area of pneumonia- will start on antibiotics.  CT abdomen/pelvis is negative.  Will get second troponin but I do not feel her symptoms are consistent with ACS. D/w Junius Creamer, NP who will followup on troponin.  First dose of antibiotics ordered in the ED.      Final Clinical Impressions(s) / ED Diagnoses   Final diagnoses:  Community acquired pneumonia, unspecified laterality    New Prescriptions New Prescriptions   AMOXICILLIN-CLAVULANATE (AUGMENTIN) 875-125 MG TABLET    Take 1 tablet by mouth every 12 (twelve) hours.   AZITHROMYCIN (ZITHROMAX Z-PAK) 250 MG TABLET    Take 1 tablet (250 mg total) by mouth daily.     Alfonzo Beers, MD 04/03/16 [phone]

## 2016-04-02 NOTE — ED Notes (Signed)
Patient transported to X-ray 

## 2016-04-02 NOTE — ED Triage Notes (Signed)
Pt. BIB [facility] from [facility] on [address] where pt was being seen r/t c/o chest tightness and SOB X36 hrs. Pt A&OX4. 324 Asprin and 1 nitro given in field. 20 in right [facility] gave 250 NS.

## 2016-04-03 LAB — I-STAT TROPONIN, ED: TROPONIN I, POC: 0 ng/mL (ref 0.00–0.08)

## 2016-04-03 MED ORDER — AMOXICILLIN-POT CLAVULANATE 875-125 MG PO TABS: 1.0000 | ORAL_TABLET | Freq: Two times a day (BID) | ORAL | 0 refills | Status: DC

## 2016-04-03 MED ORDER — AMOXICILLIN-POT CLAVULANATE 875-125 MG PO TABS
1.0000 | ORAL_TABLET | Freq: Once | ORAL | Status: AC
  Administered 2016-04-03: 1 via ORAL
  Filled 2016-04-03: qty 1

## 2016-04-03 MED ORDER — AZITHROMYCIN 250 MG PO TABS
500.0000 mg | ORAL_TABLET | Freq: Once | ORAL | Status: AC
  Administered 2016-04-03: 500 mg via ORAL
  Filled 2016-04-03: qty 2

## 2016-04-03 MED ORDER — AZITHROMYCIN 250 MG PO TABS: 250.0000 mg | ORAL_TABLET | Freq: Every day | ORAL | 0 refills | Status: DC

## 2016-04-03 NOTE — Discharge Instructions (Addendum)
Return to the ED with any concerns including difficulty breathing, chest pain, vomiting and not able to keep down liquids, fainting, decreased level of alertness/lethargy, or any other alarming symptoms  The CT scan of your abdomen showed the following results- you should discuss this with your doctor and have an ultrasound repeated in 5 years  Focal ectasia of the distal infrarenal abdominal aorta, measuring 2.6 cm in maximum diameter. Ectatic abdominal aorta at risk for aneurysm development. Recommend followup by ultrasound in 5 years. This recommendation follows ACR consensus guidelines: White Paper of the ACR Incidental Findings Committee II on Vascular Findings. J Am Coll Radiol 2013; 10:789-794.

## 2016-04-03 NOTE — ED Provider Notes (Signed)
Second troponin is 0.  She is being discharged per Dr. Liliane Shi previous instructions to continue antibiotics for community-acquired pneumonia, which include Augmentin and completion of azithromycin Z-Pak   Junius Creamer, NP 04/03/16 [id number]    Alfonzo Beers, MD 04/05/16 [phone]

## 2016-04-04 LAB — URINE CULTURE: Culture: >=100000 — AB

## 2016-04-05 ENCOUNTER — Telehealth (HOSPITAL_BASED_OUTPATIENT_CLINIC_OR_DEPARTMENT_OTHER): Payer: Self-pay | Admitting: Emergency Medicine

## 2016-04-05 NOTE — Telephone Encounter (Signed)
Post ED Visit - Positive Culture Follow-up  Culture report reviewed by antimicrobial stewardship pharmacist:  [x]  Elenor Quinones, Pharm.D. []  Heide Guile, Pharm.D., BCPS []  Parks Neptune, Pharm.D. []  Alycia Rossetti, Pharm.D., BCPS []  [place], Pharm.D., BCPS, AAHIVP []  Legrand Como, Pharm.D., BCPS, AAHIVP []  Milus Glazier, Pharm.D. []  Stephens November, Pharm.D.  Positive urine culture Treated with azithromycin and amoxicillin, organism sensitive to the same and no further patient follow-up is required at this time.  Hazle Nordmann 04/05/2016, 10:15 AM

## 2016-05-27 ENCOUNTER — Other Ambulatory Visit: Payer: Self-pay | Admitting: *Deleted

## 2016-05-27 DIAGNOSIS — I714 Abdominal aortic aneurysm, without rupture, unspecified: Secondary | ICD-10-CM

## 2016-05-27 DIAGNOSIS — I739 Peripheral vascular disease, unspecified: Secondary | ICD-10-CM

## 2016-05-27 NOTE — Progress Notes (Unsigned)
             Ivin Booty,   I forgot to order Abd Ao Ultrasound & LEA Dopplers on Ms. Canizalez for June 2018 (I just saw her on 10/26).   Glenetta Hew, MD      ORDER PLACED.

## 2016-09-02 ENCOUNTER — Ambulatory Visit (HOSPITAL_COMMUNITY)
Admission: RE | Admit: 2016-09-02 | Discharge: 2016-09-02 | Disposition: A | Discharge status: Home / Self Care | Payer: Medicare HMO | Source: Ambulatory Visit | Attending: Cardiology | Admitting: Cardiology

## 2016-09-02 DIAGNOSIS — I714 Abdominal aortic aneurysm, without rupture, unspecified: Secondary | ICD-10-CM

## 2016-09-02 DIAGNOSIS — I739 Peripheral vascular disease, unspecified: Secondary | ICD-10-CM

## 2016-09-10 ENCOUNTER — Telehealth: Payer: Self-pay | Admitting: *Deleted

## 2016-09-10 NOTE — Telephone Encounter (Signed)
LEFT DETAILED  MESSAGE  CONCERNING RESULTS ON SECURE VOICEMAIL (DPI)  ANY QUESTION MAY CALL BACK

## 2016-09-10 NOTE — Telephone Encounter (Signed)
-----   Message from Leonie Man, MD sent at 09/05/2016 11:28 PM EDT ----- Bilateral ABIs are normal -- no sign of new leg artery blockages.  Glenetta Hew  Pls forward to PCP: Dr. Drake Leach

## 2016-09-10 NOTE — Telephone Encounter (Signed)
-----   Message from Leonie Man, MD sent at 09/05/2016 11:27 PM EDT ----- Abdominal Aortic Duplex: Stable -  Technically challenging study.  Stable infrarenal fusiform dilatation in the distal aorta, not technically aneurysmal, measuring 2.8 cm x 2.8 cm.  Normal caliber common and external iliac arteries, bilaterally.  Aorto-iliac atherosclerosis.  Decrease in right common iliac artery velocities, now in normal range, s/p stent.  Stable in normal range left common iliac artery, s/p stent.  Normal right external iliac artery.  >50% left external iliac artery.  Patent IVC.  Glenetta Hew, MD

## 2016-11-18 ENCOUNTER — Other Ambulatory Visit: Payer: Self-pay | Admitting: Cardiology

## 2017-01-20 ENCOUNTER — Other Ambulatory Visit: Payer: Self-pay | Admitting: Cardiology

## 2017-06-24 ENCOUNTER — Other Ambulatory Visit: Payer: Self-pay | Admitting: Cardiology

## 2017-06-24 NOTE — Telephone Encounter (Signed)
REFILL 

## 2017-07-06 ENCOUNTER — Other Ambulatory Visit: Payer: Self-pay | Admitting: Radiology

## 2017-07-07 ENCOUNTER — Telehealth: Payer: Self-pay | Admitting: Cardiology

## 2017-07-07 NOTE — Telephone Encounter (Signed)
New Message    *STAT* If patient is at the pharmacy, call can be transferred to refill team.   1. Which medications need to be refilled? (please list name of each medication and dose if known) amLODipine (NORVASC) 2.5 MG tablet  2. Which pharmacy/location (including street and city if local pharmacy) is medication to be sent to? [facility]   3. Do they need a 30 day or 90 day supply? [place]

## 2017-07-08 ENCOUNTER — Encounter: Payer: Self-pay | Admitting: *Deleted

## 2017-07-08 MED ORDER — AMLODIPINE BESYLATE 2.5 MG PO TABS: 2.5000 mg | ORAL_TABLET | Freq: Every day | ORAL | 0 refills | Status: DC

## 2017-07-12 ENCOUNTER — Other Ambulatory Visit: Payer: Self-pay | Admitting: *Deleted

## 2017-07-12 DIAGNOSIS — C50412 Malignant neoplasm of upper-outer quadrant of left female breast: Secondary | ICD-10-CM | POA: Insufficient documentation

## 2017-07-12 DIAGNOSIS — Z170 Estrogen receptor positive status [ER+]: Principal | ICD-10-CM

## 2017-07-15 ENCOUNTER — Encounter: Payer: Self-pay | Admitting: *Deleted

## 2017-07-18 NOTE — Progress Notes (Signed)
[place]  [phone] Fax:([phone]) [phone]     ID: [name] DOB: Mar 31, 1930  MR#: [id number]  MNO#:[id number]  Patient Care Team: Drake Leach, MD as PCP - General (Internal Medicine) Magrinat, Virgie Dad, MD as Consulting Physician (Oncology) Erroll Luna, MD as Consulting Physician (General Surgery) Kyung Rudd, MD as Consulting Physician (Radiation Oncology) Leonie Man, MD as Consulting Physician (Cardiology) OTHER MD:  CHIEF COMPLAINT: Estrogen receptor positive breast cancer  CURRENT TREATMENT: Awaiting definitive surgery   HISTORY OF CURRENT ILLNESS: [name] had an annual wellness check up on 06/22/2017. Her PCP palpated a possible mass in the left breast and recommended that she have a diagnostic mammogram. She underwent bilateral diagnostic mammography with tomography and left breast ultrasonography at [facility] on 07/05/2017 showing: breast density category C. There is an irregular asymmetry in the left breast the 5 o'clock position lower outer quadrant. Sonographically, there was a mass in the 2 o'clock upper outer position anterior depth of the left breast measuring 2 cm. No abnormalities were found in the left axilla.   Accordingly on 07/06/2017 she proceeded to biopsy of the left breast area in question. The pathology from this procedure showed ([id number]): Invasive ductal carcinoma, grade II.  Also ductal carcinoma in situ. Prognostic indicators significant for: estrogen receptor, 100% positive and progesterone receptor, 100% positive, both with strong staining intensity. Proliferation marker Ki67 at 12%. HER2 not amplified with ratios HER2/CEP17 signals 1.37 and average HER2 copies per cell 1.85  The patient's subsequent history is as detailed below.  INTERVAL HISTORY: Nakenya was evaluated in the multidisciplinary breast cancer clinic on 07/20/2017 accompanied by her daughter, Particia Lather.  Her case was also presented at the  multidisciplinary breast cancer conference on the same day. At that time a preliminary plan was proposed: Breast conserving surgery, with no sentinel lymph node sampling; likely no adjuvant radiation; no Oncotype testing; adjuvant antiestrogens   REVIEW OF SYSTEMS: Aside from the breast mass itself, there were no specific symptoms leading to the diagnostic mammogram. She had a MI and stent placement remotely, but she feels that she is not limited in her activities. The patient denies unusual headaches, visual changes, nausea, vomiting, stiff neck, dizziness, or gait imbalance. There has been no cough, phlegm production, or pleurisy, no chest pain or pressure, and no change in bowel or bladder habits. The patient denies fever, rash, bleeding, unexplained fatigue or unexplained weight loss. A detailed review of systems was otherwise entirely negative.   PAST MEDICAL HISTORY: Past Medical History:  Diagnosis Date  . Abdominal aortic aneurysm ([place])    asymptomatic, 2012 -  2.6 x 2.4cm  . Anxiety   . CAD S/P percutaneous coronary angioplasty    Cath 2010: 100% occlusion of RCA  (2 BMS stents finally occluded) w/ left to right collaterals; moderate LAD and circumflex disease.  EF 55-60%;; cath 2015 with medical therapy recommended   . Chronic lower back pain   . Daily headache       . Depression   . Exertional shortness of breath   . History of stomach ulcers   . Hyperlipidemia   . Hypertension   . Hypothyroidism   . Macular degeneration    "started out dry; now is wet" (01/08/2013)  . Migraines       . Myocardial infarction ([place]) 2003  . PAD (peripheral artery disease) ([place]) 12/2005   s/p L Common Iliac Stent  . PONV (postoperative nausea and vomiting)   . Walking  pneumonia 1970's  Legally blind  PAST SURGICAL HISTORY: Past Surgical History:  Procedure Laterality Date  . ABD AOTRA DOPPLER  08/14/2012   distal mild dilatation 2.6 x 2.5cm unchanged;right cia 70-99%  new finding  .  Abdominal Aorta Duplex  09/2014   No change from prior study; 2.6 x 2.4 cm mild AAA. R Com Iliac A  > 50%. -- f/u 24 months.  . ABDOMINAL AORTAGRAM  01/08/2013   Procedure: ABDOMINAL AORTAGRAM;  Surgeon: Jonathan J Berry, MD;  Location: [facility] CATH [facility];  Service: Cardiovascular;;  . ANGIOPLASTY Right 01/08/2013   unsucess stenting RLE   . APPENDECTOMY    . CARDIAC CATHETERIZATION  11/2008   diffuse ISR in  RCA w/ good L-R collaterals; AV groove Cx - 60-70% stenosis with post stenosis ectasia;  EF 55%, mild  CAD in LAD -- Med Rx  . CATARACT EXTRACTION W/ INTRAOCULAR LENS  IMPLANT, BILATERAL Bilateral   . CHOLECYSTECTOMY    . CORONARY ANGIOPLASTY  April 2006   Cutting Balloon PTCA of RCA ISR  . CORONARY ANGIOPLASTY WITH STENT PLACEMENT  Jan 2004   RCA - 2 overlapping Pixel BMS 2.0 mm x 18 mm & 2.0 mm x 23 m   . DILATION AND CURETTAGE OF UTERUS     "couple over the years" (01/08/2013)  . ILIAC ARTERY STENT Left 12/30/2005   PTA anddirect stenting  of left common iliac artery - 8.0 x 18 mm OmniLink stent  . ILIAC ARTERY STENT Right 02/13/2013   successful diamondback orbital rotation lipectomy, PTA and stenting of highly calcified ostial common iliac artery/notes 02/13/2013  . LEFT HEART CATHETERIZATION WITH CORONARY ANGIOGRAM N/A 11/13/2013   Procedure: LEFT HEART CATHETERIZATION WITH CORONARY ANGIOGRAM;  Surgeon: David W Harding, MD; LAD 40%, CFX 60%, OM1 60%, RCA diffuse in-stent restenosis up to 99%, small PDA and PLA, collaterals from left system noted, EF 55-60 percent  . LOWER EXTREMITY ANGIOGRAM N/A 01/08/2013   Procedure: LOWER EXTREMITY ANGIOGRAM;  Surgeon: Jonathan J Berry, MD;  Location: [facility];  Service: Cardiovascular;  Laterality: N/A;  . NM MYOCAR PERF WALL MOTION  12/27/2011 -[place]   LV normal,EF 81 %  . NM MYOCAR PERF WALL MOTION  01/2015   EF 56%. Normal ROM of low risk. Small defect of mild severity and apical anterior location likely related to breast attenuation. Cannot  rule out small area of mild ischemia.  . TRANSTHORACIC ECHOCARDIOGRAM  10/2013   Normal LV size low normal function. EF 50-55%. No regional wall motion abnormality. GR 1 DD. PAP ~ 47 mmHg    FAMILY HISTORY History reviewed. No pertinent family history.  The patient's father died at age 80 due to diabetes and heart issues. The patient's mother died at age 91 due to old age. The patient has 6 brothers and 2 sisters. She denies a family history of breast or ovarian cancer.   GYNECOLOGIC HISTORY:  No LMP recorded. Patient is postmenopausal. Menarche: 84 years old Age at first live birth: 84 years old She is GXP4.  Her LMP was at age 54.  She never used contraceptives. 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 She is widowed and lives by herself, with no pets. Daughter, Linda, lives in [place] and is retired. Son, Charles, lives in [place] and is a truck driver. Son, Terry, lives in [place] and is a manager. The patient's son, Craig   died only a few months ago from a sudden intracranial bleed. The patient has 6 grandchildren and 1 great-grandchild. She attends [employer] in [place].     ADVANCED DIRECTIVES: Linda Carlisle (her daughter) is healthcare power of attorney   HEALTH MAINTENANCE: Social History   Tobacco Use  . Smoking status: Former Smoker    Packs/day: 1.00    Years: 40.00    Pack years: 40.00    Types: Cigarettes    Last attempt to quit: 09/18/1987    Years since quitting: 29.8  . Smokeless tobacco: Never Used  Substance Use Topics  . Alcohol use: No  . Drug use: No     Colonoscopy: 2009 under Dr. Mann  PAP: 10+ years ago  Bone density: 2002 ostepenia   Allergies  Allergen Reactions  . Benadryl [Diphenhydramine] Palpitations    Racing heart Heart race  . Levofloxacin Other (See Comments)    And myalgias  . Avastin [Bevacizumab]     Muscle pain  . Beta Adrenergic  Blockers Other (See Comments)    fatigue  . Hydrocodone-Acetaminophen Nausea And Vomiting    Delirium  . Other     Other reaction(s): Myalgias (intolerance) Statin-muscle pain Statin-muscle pain  . Trazodone Other (See Comments)     Per daughter in law, terrible SE    Current Outpatient Medications  Medication Sig Dispense Refill  . ALPRAZolam (XANAX) 0.5 MG tablet Take 0.5 mg by mouth daily as needed for anxiety.    . amLODipine (NORVASC) 2.5 MG tablet Take 1 tablet (2.5 mg total) by mouth daily. KEEP OV. 90 tablet 0  . levothyroxine (SYNTHROID, LEVOTHROID) 100 MCG tablet Take 100 mcg by mouth daily.    . Omega-3 Fatty Acids (FISH OIL) 1000 MG CAPS Take by mouth daily.    . sertraline (ZOLOFT) 25 MG tablet Take 25 mg by mouth daily.    . trandolapril (MAVIK) 2 MG tablet TAKE 1 TABLET EVERY DAY 60 tablet 0  . aspirin 81 MG tablet Take 81 mg by mouth daily.    . nitroGLYCERIN (NITROSTAT) 0.4 MG SL tablet Place 1 tablet (0.4 mg total) under the tongue every 5 (five) minutes as needed for chest pain. 25 tablet 4  . ranitidine (ZANTAC) 150 MG tablet Take 150 mg by mouth as directed.     No current facility-administered medications for this visit.     OBJECTIVE: Older white woman in no acute distress  Vitals:   07/20/17 0901  BP: (!) 162/58  Pulse: 82  Resp: 17  Temp: 97.6 F (36.4 C)  SpO2: 100%     Body mass index is 25.18 kg/m.   Wt Readings from Last 3 Encounters:  07/20/17 149 lb (67.6 kg)  02/11/16 147 lb 12.8 oz (67 kg)  03/10/15 145 lb (65.8 kg)      ECOG FS:0 - Asymptomatic  Ocular: Sclerae unicteric, pupils round and equal Lymphatic: No cervical or supraclavicular adenopathy Lungs no rales or rhonchi Heart regular rate and rhythm Abd soft, nontender, positive bowel sounds MSK no focal spinal tenderness, no joint edema Neuro: non-focal, well-oriented, appropriate affect Breasts: The right breast is unremarkable.  The left breast is status post recent biopsy.   There is some induration around the nipple.  There is no erythema.  Both axillae are benign.   LAB RESULTS:  CMP     Component Value Date/Time   NA 138 04/02/2016 2042   K 3.6 04/02/2016 2042   CL 106 04/02/2016 2042     CO2 24 04/02/2016 2042   GLUCOSE 104 (H) 04/02/2016 2042   BUN 15 04/02/2016 2042   CREATININE 0.70 04/02/2016 2042   CREATININE 0.73 11/07/2013 1346   CALCIUM 9.4 04/02/2016 2042   PROT 6.3 (L) 04/02/2016 2042   ALBUMIN 3.6 04/02/2016 2042   AST 27 04/02/2016 2042   ALT 21 04/02/2016 2042   ALKPHOS 63 04/02/2016 2042   BILITOT 0.3 04/02/2016 2042   GFRNONAA >60 04/02/2016 2042   GFRAA >60 04/02/2016 2042    No results found for: TOTALPROTELP, ALBUMINELP, A1GS, A2GS, BETS, BETA2SER, GAMS, MSPIKE, SPEI  No results found for: KPAFRELGTCHN, LAMBDASER, KAPLAMBRATIO  Lab Results  Component Value Date   WBC 7.5 04/02/2016   NEUTROABS 3.5 11/26/2008   HGB 13.5 04/02/2016   HCT 39.8 04/02/2016   MCV 96.8 04/02/2016   PLT 157 04/02/2016    @LASTCHEMISTRY@  No results found for: LABCA2  No components found for: LABCAN125  No results for input(s): INR in the last 168 hours.  No results found for: LABCA2  No results found for: CAN199  No results found for: CAN125  No results found for: CAN153  No results found for: CA2729  No components found for: HGQUANT  No results found for: CEA1 / No results found for: CEA1   No results found for: AFPTUMOR  No results found for: CHROMOGRNA  No results found for: PSA1  No visits with results within 3 Day(s) from this visit.  Latest known visit with results is:  Admission on 04/02/2016, Discharged on 04/03/2016  Component Date Value Ref Range Status  . WBC 04/02/2016 7.5  4.0 - 10.5 K/uL Final  . RBC 04/02/2016 4.11  3.87 - 5.11 MIL/uL Final  . Hemoglobin 04/02/2016 13.5  12.0 - 15.0 g/dL Final  . HCT 04/02/2016 39.8  36.0 - 46.0 % Final  . MCV 04/02/2016 96.8  78.0 - 100.0 fL Final  . MCH 04/02/2016  32.8  26.0 - 34.0 pg Final  . MCHC 04/02/2016 33.9  30.0 - 36.0 g/dL Final  . RDW 04/02/2016 12.6  11.5 - 15.5 % Final  . Platelets 04/02/2016 157  150 - 400 K/uL Final  . Troponin i, poc 04/02/2016 0.00  0.00 - 0.08 ng/mL Final  . Comment 3 04/02/2016          Final   Comment: Due to the release kinetics of cTnI, a negative result within the first hours of the onset of symptoms does not rule out myocardial infarction with certainty. If myocardial infarction is still suspected, repeat the test at appropriate intervals.   . Color, Urine 04/02/2016 YELLOW  YELLOW Final  . APPearance 04/02/2016 HAZY* CLEAR Final  . Specific Gravity, Urine 04/02/2016 1.018  1.005 - 1.030 Final  . pH 04/02/2016 5.0  5.0 - 8.0 Final  . Glucose, UA 04/02/2016 NEGATIVE  NEGATIVE mg/dL Final  . Hgb urine dipstick 04/02/2016 NEGATIVE  NEGATIVE Final  . Bilirubin Urine 04/02/2016 NEGATIVE  NEGATIVE Final  . Ketones, ur 04/02/2016 NEGATIVE  NEGATIVE mg/dL Final  . Protein, ur 04/02/2016 NEGATIVE  NEGATIVE mg/dL Final  . Nitrite 04/02/2016 NEGATIVE  NEGATIVE Final  . Leukocytes, UA 04/02/2016 LARGE* NEGATIVE Final  . RBC / HPF 04/02/2016 6-30  0 - 5 RBC/hpf Final  . WBC, UA 04/02/2016 TOO NUMEROUS TO COUNT  0 - 5 WBC/hpf Final  . Bacteria, UA 04/02/2016 NONE SEEN  NONE SEEN Final  . Squamous Epithelial / LPF 04/02/2016 0-5* NONE SEEN Final  . Mucus 04/02/2016 PRESENT     Final  . Hyaline Casts, UA 04/02/2016 PRESENT   Final  . Ca Oxalate Crys, UA 04/02/2016 PRESENT   Final  . Non Squamous Epithelial 04/02/2016 0-5* NONE SEEN Final  . D-Dimer, Quant 04/02/2016 0.51* 0.00 - 0.50 ug/mL-FEU Final   Comment: (NOTE) At the manufacturer cut-off of 0.50 ug/mL FEU, this assay has been documented to exclude PE with a sensitivity and negative predictive value of 97 to 99%.  At this time, this assay has not been approved by the FDA to exclude DVT/VTE. Results should be correlated with clinical presentation.   . Sodium  04/02/2016 138  135 - 145 mmol/L Final  . Potassium 04/02/2016 3.6  3.5 - 5.1 mmol/L Final  . Chloride 04/02/2016 106  101 - 111 mmol/L Final  . CO2 04/02/2016 24  22 - 32 mmol/L Final  . Glucose, Bld 04/02/2016 104* 65 - 99 mg/dL Final  . BUN 04/02/2016 15  6 - 20 mg/dL Final  . Creatinine, Ser 04/02/2016 0.70  0.44 - 1.00 mg/dL Final  . Calcium 04/02/2016 9.4  8.9 - 10.3 mg/dL Final  . Total Protein 04/02/2016 6.3* 6.5 - 8.1 g/dL Final  . Albumin 04/02/2016 3.6  3.5 - 5.0 g/dL Final  . AST 04/02/2016 27  15 - 41 U/L Final  . ALT 04/02/2016 21  14 - 54 U/L Final  . Alkaline Phosphatase 04/02/2016 63  38 - 126 U/L Final  . Total Bilirubin 04/02/2016 0.3  0.3 - 1.2 mg/dL Final  . GFR calc non Af Amer 04/02/2016 >60  >60 mL/min Final  . GFR calc Af Amer 04/02/2016 >60  >60 mL/min Final   Comment: (NOTE) The eGFR has been calculated using the CKD EPI equation. This calculation has not been validated in all clinical situations. eGFR's persistently <60 mL/min signify possible Chronic Kidney Disease.   . Anion gap 04/02/2016 8  5 - 15 Final  . Lipase 04/02/2016 35  11 - 51 U/L Final  . Specimen Description 04/02/2016 URINE, RANDOM   Final  . Special Requests 04/02/2016 NONE   Final  . Culture 04/02/2016 *  Final                   Value:>=100,000 COLONIES/mL GROUP B STREP(S.AGALACTIAE)ISOLATED TESTING AGAINST S. AGALACTIAE NOT ROUTINELY PERFORMED DUE TO PREDICTABILITY OF AMP/PEN/VAN SUSCEPTIBILITY. >=100,000 COLONIES/mL DIPHTHEROIDS(CORYNEBACTERIUM SPECIES) Standardized susceptibility testing for this organism is not available.   . Report Status 04/02/2016 04/04/2016 FINAL   Final  . Troponin i, poc 04/03/2016 0.00  0.00 - 0.08 ng/mL Final  . Comment 3 04/03/2016          Final   Comment: Due to the release kinetics of cTnI, a negative result within the first hours of the onset of symptoms does not rule out myocardial infarction with certainty. If myocardial infarction is still  suspected, repeat the test at appropriate intervals.     (this displays the last labs from the last 3 days)  No results found for: TOTALPROTELP, ALBUMINELP, A1GS, A2GS, BETS, BETA2SER, GAMS, MSPIKE, SPEI (this displays SPEP labs)  No results found for: KPAFRELGTCHN, LAMBDASER, KAPLAMBRATIO (kappa/lambda light chains)  No results found for: HGBA, HGBA2QUANT, HGBFQUANT, HGBSQUAN (Hemoglobinopathy evaluation)   No results found for: LDH  No results found for: IRON, TIBC, IRONPCTSAT (Iron and TIBC)  No results found for: FERRITIN  Urinalysis    Component Value Date/Time   COLORURINE YELLOW 04/02/2016 2136   APPEARANCEUR HAZY (A) 04/02/2016 2136   LABSPEC 1.018 04/02/2016 2136  PHURINE 5.0 04/02/2016 2136   GLUCOSEU NEGATIVE 04/02/2016 2136   HGBUR NEGATIVE 04/02/2016 2136   BILIRUBINUR NEGATIVE 04/02/2016 2136   KETONESUR NEGATIVE 04/02/2016 2136   PROTEINUR NEGATIVE 04/02/2016 2136   NITRITE NEGATIVE 04/02/2016 2136   LEUKOCYTESUR LARGE (A) 04/02/2016 2136     STUDIES: Outside studies reviewed with the patient  ELIGIBLE FOR AVAILABLE RESEARCH PROTOCOL: [employer] sciences study  ASSESSMENT: 84 y.o. [place], [place] woman status post left breast upper outer quadrant biopsy 07/06/2017 for a clinical T1c N0, stage IA invasive ductal carcinoma, grade 2, estrogen and progesterone receptor positive, HER-2 not amplified, with an MIB-1 of 12%  (1) breast conserving surgery with no sentinel lymph node planned  (2) likely may forgo adjuvant radiation  (3) adjuvant antiestrogens  PLAN: We spent the better part of today's hour-long appointment discussing the biology of her diagnosis and the specifics of her situation. We discussed the difference between local and systemic therapy. In terms of loco-regional treatment, lumpectomy plus radiation is equivalent to mastectomy as far as survival is concerned. For this reason, and because the cosmetic results are generally superior, we recommend  breast conserving surgery.   We then discussed the rationale for systemic therapy. There is some risk that this cancer may have already spread to other parts of her body. Patients frequently ask at this point about bone scans, CAT scans and PET scans to find out if they have occult breast cancer somewhere else. The problem is that in early stage disease we are much more likely to find false positives then true cancers and this would expose the patient to unnecessary procedures as well as unnecessary radiation. Scans cannot answer the question the patient really would like to know, which is whether she has microscopic disease elsewhere in her body. For those reasons we do not recommend them.  Of course we would proceed to aggressive evaluation of any symptoms that might suggest metastatic disease, but that is not the case here.  Next we went over the options for systemic therapy which are anti-estrogens, anti-HER-2 immunotherapy, and chemotherapy. Itzell does not meet criteria for anti-HER-2 immunotherapy. She is a good candidate for anti-estrogens.  The question of chemotherapy is more complicated. Chemotherapy is most effective in rapidly growing, aggressive tumors. It is much less effective in slow growing cancers, like Jameika 's. For that reason and given the patient's advanced age we are going to forego Oncotype testing and avoid chemotherapy.  We will consider antiestrogens once we have the final pathology results from the upcoming surgery  The plan then is for breast conserving surgery with consideration of antiestrogens to follow.  Temima has a good understanding of the overall plan. She agrees with it. She knows the goal of treatment in her case is cure. She will call with any problems that may develop before her next visit here.   Magrinat, Gustav C, MD  07/20/17 9:44 AM Medical Oncology and Hematology [facility] [address] [place], [place] [address] Tel. [phone]     Fax. [phone]  This document serves as a record of services personally performed by Gustav Magrinat, MD. It was created on his behalf by Arielle Pollard, a trained medical scribe. The creation of this record is based on the scribe's personal observations and the provider's statements to them.   I have reviewed the above documentation for accuracy and completeness, and I agree with the above.     

## 2017-07-20 ENCOUNTER — Inpatient Hospital Stay: Payer: Medicare HMO | Attending: Oncology | Admitting: Oncology

## 2017-07-20 ENCOUNTER — Inpatient Hospital Stay: Payer: Medicare HMO

## 2017-07-20 ENCOUNTER — Ambulatory Visit: Payer: Self-pay | Admitting: Surgery

## 2017-07-20 ENCOUNTER — Ambulatory Visit: Payer: Medicare HMO | Admitting: Physical Therapy

## 2017-07-20 ENCOUNTER — Other Ambulatory Visit: Payer: Medicare HMO

## 2017-07-20 ENCOUNTER — Encounter: Payer: Self-pay | Admitting: Oncology

## 2017-07-20 ENCOUNTER — Ambulatory Visit
Admission: RE | Admit: 2017-07-20 | Discharge: 2017-07-20 | Disposition: A | Discharge status: Home / Self Care | Payer: Medicare HMO | Source: Ambulatory Visit | Attending: Radiation Oncology | Admitting: Radiation Oncology

## 2017-07-20 ENCOUNTER — Other Ambulatory Visit: Payer: Self-pay | Admitting: *Deleted

## 2017-07-20 ENCOUNTER — Telehealth: Payer: Self-pay | Admitting: Oncology

## 2017-07-20 ENCOUNTER — Encounter: Payer: Self-pay | Admitting: *Deleted

## 2017-07-20 VITALS — BP 162/58 | HR 82 | Temp 97.6°F | Resp 17 | Ht 64.5 in | Wt 149.0 lb

## 2017-07-20 DIAGNOSIS — Z9049 Acquired absence of other specified parts of digestive tract: Secondary | ICD-10-CM | POA: Insufficient documentation

## 2017-07-20 DIAGNOSIS — E039 Hypothyroidism, unspecified: Secondary | ICD-10-CM | POA: Diagnosis not present

## 2017-07-20 DIAGNOSIS — C50412 Malignant neoplasm of upper-outer quadrant of left female breast: Secondary | ICD-10-CM

## 2017-07-20 DIAGNOSIS — I714 Abdominal aortic aneurysm, without rupture, unspecified: Secondary | ICD-10-CM

## 2017-07-20 DIAGNOSIS — G8929 Other chronic pain: Secondary | ICD-10-CM | POA: Diagnosis not present

## 2017-07-20 DIAGNOSIS — I2510 Atherosclerotic heart disease of native coronary artery without angina pectoris: Secondary | ICD-10-CM | POA: Diagnosis not present

## 2017-07-20 DIAGNOSIS — Z79899 Other long term (current) drug therapy: Secondary | ICD-10-CM

## 2017-07-20 DIAGNOSIS — I739 Peripheral vascular disease, unspecified: Secondary | ICD-10-CM | POA: Diagnosis not present

## 2017-07-20 DIAGNOSIS — Z170 Estrogen receptor positive status [ER+]: Principal | ICD-10-CM

## 2017-07-20 DIAGNOSIS — M545 Low back pain: Secondary | ICD-10-CM | POA: Diagnosis not present

## 2017-07-20 DIAGNOSIS — Z87891 Personal history of nicotine dependence: Secondary | ICD-10-CM | POA: Insufficient documentation

## 2017-07-20 DIAGNOSIS — Z7982 Long term (current) use of aspirin: Secondary | ICD-10-CM | POA: Insufficient documentation

## 2017-07-20 DIAGNOSIS — I252 Old myocardial infarction: Secondary | ICD-10-CM | POA: Insufficient documentation

## 2017-07-20 DIAGNOSIS — F419 Anxiety disorder, unspecified: Secondary | ICD-10-CM | POA: Diagnosis not present

## 2017-07-20 DIAGNOSIS — C50912 Malignant neoplasm of unspecified site of left female breast: Secondary | ICD-10-CM

## 2017-07-20 DIAGNOSIS — I10 Essential (primary) hypertension: Secondary | ICD-10-CM | POA: Diagnosis not present

## 2017-07-20 DIAGNOSIS — I5032 Chronic diastolic (congestive) heart failure: Secondary | ICD-10-CM

## 2017-07-20 DIAGNOSIS — F329 Major depressive disorder, single episode, unspecified: Secondary | ICD-10-CM | POA: Insufficient documentation

## 2017-07-20 DIAGNOSIS — E785 Hyperlipidemia, unspecified: Secondary | ICD-10-CM | POA: Insufficient documentation

## 2017-07-20 LAB — RESEARCH LABS

## 2017-07-20 NOTE — Telephone Encounter (Signed)
Called regarding 5/17 talk with daughter

## 2017-07-20 NOTE — H&P (Signed)
[name] Documented: 07/20/2017 7:31 AM Location: [facility] Patient #: [id number] DOB: [birth year] Undefined / Language: [name] / Race: White Female  History of Present Illness Terry Moores A. Baili Stang MD; 07/20/2017 12:22 PM) Patient words: Pt sent at the request of Dr Lisbeth Renshaw for a left breast mass picked up on mammogram and felt by the patient a couple of months ago. Location is left central breast. It is not painful. No nipple discharge. No redness or drainage. Mammogram show 2 cm mass core bx IDC grade 1 ER/PR pos her 2 neu negative.  She denies CP SOB.  The patient is a 84 year old female.   Past Surgical History Tawni Pummel, RN; 07/20/2017 7:31 AM) Appendectomy Breast Biopsy Left. Cataract Surgery Bilateral. Gallbladder Surgery - Laparoscopic Oral Surgery  Diagnostic Studies History Tawni Pummel, RN; 07/20/2017 7:31 AM) Colonoscopy 5-10 years ago Mammogram within last year Pap Smear >5 years ago  Medication History Tawni Pummel, RN; 07/20/2017 7:32 AM) Medications Reconciled  Social History Tawni Pummel, RN; 07/20/2017 7:31 AM) Caffeine use Coffee. No alcohol use No drug use Tobacco use Former smoker.  Family History Tawni Pummel, RN; 07/20/2017 7:31 AM) Arthritis Family Members In General, Father, Mother. Cancer Brother. Diabetes Mellitus Family Members In [place], Son. Heart Disease Father, Mother. Hypertension Family Members In General, Father, Mother. Prostate Cancer Brother. Respiratory Condition Father. Thyroid problems Sister.  Pregnancy / Birth History Tawni Pummel, RN; 07/20/2017 7:31 AM) Age at menarche 21 years. Age of menopause 51-55 Contraceptive History Oral contraceptives. Gravida 7 Irregular periods Length (months) of breastfeeding 7-12 Maternal age 37-20 Para 49  Other Problems Tawni Pummel, RN; 07/20/2017 7:31 AM) Anxiety Disorder Back Pain Breast Cancer Chest  pain Cholelithiasis Depression Diverticulosis Gastric Ulcer Gastroesophageal Reflux Disease High blood pressure Hypercholesterolemia Kidney Stone Lump In Breast Migraine Headache Myocardial infarction Thyroid Disease     Review of Systems (Terry Catterton A. Paymon Rosensteel MD; 07/20/2017 12:25 PM) General Present- Fatigue. Not Present- Appetite Loss, Chills, Fever, Night Sweats, Weight Gain and Weight Loss. Skin Present- Dryness. Not Present- Change in Wart/Mole, Hives, Jaundice, New Lesions, Non-Healing Wounds, Rash and Ulcer. HEENT Present- Hearing Loss. Not Present- Earache, Hoarseness, Nose Bleed, Oral Ulcers, Ringing in the Ears, Seasonal Allergies, Sinus Pain, Sore Throat, Visual Disturbances, Wears glasses/contact lenses and Yellow Eyes. Respiratory Present- Chronic Cough, Difficulty Breathing and Wheezing. Not Present- Bloody sputum and Snoring. Breast Present- Breast Mass. Not Present- Breast Pain, Nipple Discharge and Skin Changes. Cardiovascular Present- Palpitations and Shortness of Breath. Not Present- Chest Pain, Difficulty Breathing Lying Down, Leg Cramps, Rapid Heart Rate and Swelling of Extremities. Gastrointestinal Present- Bloating, Chronic diarrhea and Difficulty Swallowing. Not Present- Abdominal Pain, Bloody Stool, Change in Bowel Habits, Constipation, Excessive gas, Gets full quickly at meals, Hemorrhoids, Indigestion, Nausea, Rectal Pain and Vomiting. Female Genitourinary Not Present- Frequency, Nocturia, Painful Urination, Pelvic Pain and Urgency. Musculoskeletal Present- Back Pain. Not Present- Joint Pain, Joint Stiffness, Muscle Pain, Muscle Weakness and Swelling of Extremities. Neurological Present- Trouble walking and Weakness. Not Present- Decreased Memory, Fainting, Headaches, Numbness, Seizures, Tingling and Tremor. Psychiatric Present- Depression. Not Present- Anxiety, Bipolar, Change in Sleep Pattern, Fearful and Frequent crying. Endocrine Present- Hair  Changes and Heat Intolerance. Not Present- Cold Intolerance, Excessive Hunger, Hot flashes and New Diabetes. Hematology Present- Easy Bruising. Not Present- Blood Thinners, Excessive bleeding, Gland problems, HIV and Persistent Infections. All other systems negative   Physical Exam (Terry Slabach A. Michaela Broski MD; 07/20/2017 12:23 PM)  General Mental Status-Alert. General Appearance-Consistent with stated age. Hydration-Well hydrated. Voice-Normal.  Head and Neck Head-normocephalic, atraumatic with no lesions or palpable masses. Trachea-midline.  Eye Eyeball - Bilateral-Extraocular movements intact. Sclera/Conjunctiva - Bilateral-No scleral icterus.  Chest and Lung Exam Chest and lung exam reveals -quiet, even and easy respiratory effort with no use of accessory muscles and on auscultation, normal breath sounds, no adventitious sounds and normal vocal resonance. Inspection Chest Wall - Normal. Back - normal.  Breast Note: left central breast mass 2 cm below nipple but no retraction. right breast normal  Neurologic Neurologic evaluation reveals -alert and oriented x 3 with no impairment of recent or remote memory. Mental Status-Normal.  Musculoskeletal Normal Exam - Left-Upper Extremity Strength Normal and Lower Extremity Strength Normal. Normal Exam - Right-Upper Extremity Strength Normal, Lower Extremity Weakness.    Assessment & Plan (Terry Grays A. Keonna Raether MD; 07/20/2017 12:25 PM)  BREAST CANCER, LEFT (C50.912) Impression: recommend left seed lumpectomy She does not need SLN mapping and discussed with her and her daughter  Risk of lumpectomy include bleeding, infection, seroma, more surgery, use of seed/wire, wound care, cosmetic deformity and the need for other treatments, death , blood clots, death. Pt agrees to proceed.  Current Plans You are being scheduled for surgery- Our schedulers will call you.  You should hear from our office's scheduling  department within 5 working days about the location, date, and time of surgery. We try to make accommodations for patient's preferences in scheduling surgery, but sometimes the OR schedule or the surgeon's schedule prevents [place] from making those accommodations.  If you have not heard from our office [phone] in 5 working days, call the office and ask for your surgeon's nurse.  If you have other questions about your diagnosis, plan, or surgery, call the office and ask for your surgeon's nurse.  Pt Education - Pamphlet Given - Breast Biopsy: discussed with patient and provided information. We discussed the staging and pathophysiology of breast cancer. We discussed all of the different options for treatment for breast cancer including surgery, chemotherapy, radiation therapy, Herceptin, and antiestrogen therapy. We discussed a sentinel lymph node biopsy as she does not appear to having lymph node involvement right now. We discussed the performance of that with injection of radioactive tracer and blue dye. We discussed that she would have an incision underneath her axillary hairline. We discussed that there is a bout a 10-20% chance of having a positive node with a sentinel lymph node biopsy and we will await the permanent pathology to make any other first further decisions in terms of her treatment. One of these options might be to return to the operating room to perform an axillary lymph node dissection. We discussed about a 1-2% risk lifetime of chronic shoulder pain as well as lymphedema associated with a sentinel lymph node biopsy. We discussed the options for treatment of the breast cancer which included lumpectomy versus a mastectomy. We discussed the performance of the lumpectomy with a wire placement. We discussed a 10-20% chance of a positive margin requiring reexcision in the operating room. We also discussed that she may need radiation therapy or antiestrogen therapy or both if she undergoes  lumpectomy. We discussed the mastectomy and the postoperative care for that as well. We discussed that there is no difference in her survival whether she undergoes lumpectomy with radiation therapy or antiestrogen therapy versus a mastectomy. There is a slight difference in the local recurrence rate being 3-5% with lumpectomy and about 1% with a mastectomy. We discussed the risks of operation including bleeding, infection, possible reoperation.  She understands her further therapy will be based on what her stages at the time of her operation.  Pt Education - flb breast cancer surgery: discussed with patient and provided information. Pt Education - CCS Breast Biopsy HCI: discussed with patient and provided information. Pt Education - ABC (After Breast Cancer) Class Info: discussed with patient and provided information.  MALIGNANT NEOPLASM OF CENTRAL PORTION OF LEFT FEMALE BREAST, UNSPECIFIED ESTROGEN RECEPTOR STATUS (C50.11

## 2017-07-20 NOTE — Progress Notes (Signed)
Nutrition Assessment  Reason for Assessment:  Pt seen in Breast Clinic  ASSESSMENT:   84 year old female with new diagnosis of breast cancer.  Past medical history reviewed.  Patient reports normal appetite and intake.    Medications:  reviewed  Labs: reviewed  Anthropometrics:   Height: 64.5 inches Weight: 149 lb BMI: 25  Reports stable weight   NUTRITION DIAGNOSIS: Food and nutrition related knowledge deficit related to new diagnosis of breast cancer as evidenced by no prior need for nutrition related information.  INTERVENTION:   Discussed and provided packet of information regarding nutritional tips for breast cancer patients.  Questions answered.  Teachback method used.  Contact information provided and patient knows to contact me with questions/concerns.    MONITORING, EVALUATION, and GOAL: Pt will consume a healthy plant based diet to maintain lean body mass throughout treatment.   Juwan Vences B. Zenia Resides, [place], [place] Registered Dietitian [phone] (pager)

## 2017-07-20 NOTE — Progress Notes (Signed)
Radiation Oncology         [phone] ________________________________  Name: [name]        MRN: [id number]  Date of Service: 07/20/2017 DOB: July 29, 1929  GY:JEHUDJS, Terry Coffee, MD  Erroll Luna, MD     REFERRING PHYSICIAN: Erroll Luna, MD   DIAGNOSIS: The encounter diagnosis was Malignant neoplasm of upper-outer quadrant of left breast in female, estrogen receptor positive ([place]).   HISTORY OF PRESENT ILLNESS: [name] is a 84 y.o. female seen in the multidisciplinary breast clinic for a new diagnosis of left breast cancer. The patient was noted to have a palpable mass within the left breast.  She was seen for diagnostic imaging and her breast did have an increased density seen on mammography as well as by examination.  By ultrasound there was a 2 cm mass at 2:00, and her axilla was negative for adenopathy.  She underwent a biopsy on 07/06/17 which revealed a grade 2 invasive ductal carcinoma with DCIS, ER/PR positive, HER-2/neu negative, Ki-67 of 12%.  She comes today to discuss options of treatment for her cancer.    PREVIOUS RADIATION THERAPY: No   PAST MEDICAL HISTORY:  Past Medical History:  Diagnosis Date  . Abdominal aortic aneurysm ([place])    asymptomatic, 2012 -  2.6 x 2.4cm  . Anxiety   . CAD S/P percutaneous coronary angioplasty    Cath 2010: 100% occlusion of RCA  (2 BMS stents finally occluded) w/ left to right collaterals; moderate LAD and circumflex disease.  EF 55-60%;; cath 2015 with medical therapy recommended   . Chronic lower back pain   . Daily headache       . Depression   . Exertional shortness of breath   . History of stomach ulcers   . Hyperlipidemia   . Hypertension   . Hypothyroidism   . Macular degeneration    "started out dry; now is wet" (01/08/2013)  . Migraines       . Myocardial infarction 2003  . PAD (peripheral artery disease) ([place]) 12/2005   s/p L Common Iliac Stent  . PONV (postoperative nausea and vomiting)   . Walking  pneumonia 1970's       PAST SURGICAL HISTORY: Past Surgical History:  Procedure Laterality Date  . ABD AOTRA DOPPLER  08/14/2012   distal mild dilatation 2.6 x 2.5cm unchanged;right cia 70-99%  new finding  . Abdominal Aorta Duplex  09/2014   No change from prior study; 2.6 x 2.4 cm mild AAA. R Com Iliac A  > 50%. -- f/u 24 months.  . ABDOMINAL AORTAGRAM  01/08/2013   Procedure: ABDOMINAL Maxcine Ham;  Surgeon: Lorretta Harp, MD;  Location: [facility] CATH LAB;  Service: Cardiovascular;;  . ANGIOPLASTY Right 01/08/2013   unsucess stenting RLE   . APPENDECTOMY    . CARDIAC CATHETERIZATION  11/2008   diffuse ISR in  RCA w/ good L-R collaterals; AV groove Cx - 60-70% stenosis with post stenosis ectasia;  EF 55%, mild  CAD in LAD -- Med Rx  . CATARACT EXTRACTION W/ INTRAOCULAR LENS  IMPLANT, BILATERAL Bilateral   . CHOLECYSTECTOMY    . CORONARY ANGIOPLASTY  April 2006   Cutting Balloon PTCA of RCA ISR  . CORONARY ANGIOPLASTY WITH STENT PLACEMENT  Jan 2004   RCA - 2 overlapping Pixel BMS 2.0 mm x 18 mm & 2.0 mm x 23 m   . DILATION AND CURETTAGE OF UTERUS     "couple over the years" (01/08/2013)  .  ILIAC ARTERY STENT Left 12/30/2005   PTA anddirect stenting  of left common iliac artery - 8.0 x 18 mm OmniLink stent  . ILIAC ARTERY STENT Right 02/13/2013   successful diamondback orbital rotation lipectomy, PTA and stenting of highly calcified ostial common iliac artery/notes 02/13/2013  . LEFT HEART CATHETERIZATION WITH CORONARY ANGIOGRAM N/A 11/13/2013   Procedure: LEFT HEART CATHETERIZATION WITH CORONARY ANGIOGRAM;  Surgeon: Leonie Man, MD; LAD 40%, CFX 60%, OM1 60%, RCA diffuse in-stent restenosis up to 99%, small PDA and PLA, collaterals from left system noted, EF 55-60 percent  . LOWER EXTREMITY ANGIOGRAM N/A 01/08/2013   Procedure: LOWER EXTREMITY ANGIOGRAM;  Surgeon: Lorretta Harp, MD;  Location: [facility] C[facility] LAB;  Service: Cardiovascular;  Laterality: N/A;  . NM MYOCAR PERF WALL MOTION   12/27/2011 -[place]   LV normal,EF 81 %  . NM MYOCAR PERF WALL MOTION  01/2015   EF 56%. Normal ROM of low risk. Small defect of mild severity and apical anterior location likely related to breast attenuation. Cannot rule out small area of mild ischemia.  . TRANSTHORACIC ECHOCARDIOGRAM  10/2013   Normal LV size low normal function. EF 50-55%. No regional wall motion abnormality. GR 1 DD. PAP ~ 47 mmHg     FAMILY HISTORY: No family history on file.   SOCIAL HISTORY:  reports that she quit smoking about 29 years ago. Her smoking use included cigarettes. She has a 40.00 pack-year smoking history. She has never used smokeless tobacco. She reports that she does not drink alcohol or use drugs. The patient is widowed and lives in [place]. She is accompanied by her daughter.   ALLERGIES: Benadryl [diphenhydramine]; Levofloxacin; Avastin [bevacizumab]; Beta adrenergic blockers; Hydrocodone-acetaminophen; Other; and Trazodone   MEDICATIONS:  Current Outpatient Medications  Medication Sig Dispense Refill  . ALPRAZolam (XANAX) 0.5 MG tablet Take 0.5 mg by mouth daily as needed for anxiety.    [name] amLODipine (NORVASC) 2.5 MG tablet Take 1 tablet (2.5 mg total) by mouth daily. KEEP OV. 90 tablet 0  . amoxicillin-clavulanate (AUGMENTIN) 875-125 MG tablet Take 1 tablet by mouth every 12 (twelve) hours. 14 tablet 0  . amoxicillin-clavulanate (AUGMENTIN) 875-125 MG tablet Take 1 tablet by mouth every 12 (twelve) hours. 14 tablet 0  . aspirin 81 MG tablet Take 81 mg by mouth daily.    [name] azithromycin (ZITHROMAX Z-PAK) 250 MG tablet Take 1 tablet (250 mg total) by mouth daily. 4 tablet 0  . azithromycin (ZITHROMAX) 250 MG tablet Take 1 tablet (250 mg total) by mouth daily. 4 tablet 0  . glucosamine-chondroitin 500-400 MG tablet Take 1 tablet by mouth 2 (two) times daily.    [name] levothyroxine (SYNTHROID, LEVOTHROID) 100 MCG tablet Take 100 mcg by mouth daily.    . nitroGLYCERIN (NITROSTAT) 0.4 MG SL tablet  Place 1 tablet (0.4 mg total) under the tongue every 5 (five) minutes as needed for chest pain. 25 tablet 4  . ranitidine (ZANTAC) 150 MG tablet Take 150 mg by mouth as directed.    . sertraline (ZOLOFT) 25 MG tablet Take 25 mg by mouth daily.    . trandolapril (MAVIK) 2 MG tablet TAKE 1 TABLET EVERY DAY 60 tablet 0   No current facility-administered medications for this encounter.      REVIEW OF SYSTEMS: On review of systems, the patient reports that she is doing well overall. She denies any chest pain, shortness of breath, cough, fevers, chills, night sweats, unintended weight changes. She denies any bowel or  bladder disturbances, and denies abdominal pain, nausea or vomiting. She denies any new musculoskeletal or joint aches or pains. A complete review of systems is obtained and is otherwise negative.     PHYSICAL EXAM:  Wt Readings from Last 3 Encounters:  02/11/16 147 lb 12.8 oz (67 kg)  03/10/15 145 lb (65.8 kg)  01/21/15 148 lb (67.1 kg)   Temp Readings from Last 3 Encounters:  04/02/16 98.2 F (36.8 C) (Oral)  11/13/13 98.6 F (37 C) (Oral)  02/14/13 98.1 F (36.7 C) (Oral)   BP Readings from Last 3 Encounters:  04/03/16 105/86  02/11/16 (!) 142/82  03/10/15 110/76   Pulse Readings from Last 3 Encounters:  04/03/16 70  02/11/16 75  03/10/15 68     In general this is a well appearing caucasian female in no acute distress. She is alert and oriented x4 and appropriate throughout the examination. HEENT reveals that the patient is normocephalic, atraumatic. EOMs are intact. Cardiopulmonary assessment is negative for acute distress and she exhibits normal effort. Breast exam is deferred.   ECOG = 0  0 - Asymptomatic (Fully active, able to carry on all predisease activities without restriction)  1 - Symptomatic but completely ambulatory (Restricted in physically strenuous activity but ambulatory and able to carry out work of a light or sedentary nature. For example,  light housework, office work)  2 - Symptomatic, <50% in bed during the day (Ambulatory and capable of all self care but unable to carry out any work activities. Up and about more than 50% of waking hours)  3 - Symptomatic, >50% in bed, but not bedbound (Capable of only limited self-care, confined to bed or chair 50% or more of waking hours)  4 - Bedbound (Completely disabled. Cannot carry on any self-care. Totally confined to bed or chair)  5 - Death   Eustace Pen MM, Creech RH, Tormey DC, et al. [phone]). "Toxicity and response criteria of the [facility]". [place] Oncol. 5 (6): 649-55    LABORATORY DATA:  Lab Results  Component Value Date   WBC 7.5 04/02/2016   HGB 13.5 04/02/2016   HCT 39.8 04/02/2016   MCV 96.8 04/02/2016   PLT 157 04/02/2016   Lab Results  Component Value Date   NA 138 04/02/2016   K 3.6 04/02/2016   CL 106 04/02/2016   CO2 24 04/02/2016   Lab Results  Component Value Date   ALT 21 04/02/2016   AST 27 04/02/2016   ALKPHOS 63 04/02/2016   BILITOT 0.3 04/02/2016      RADIOGRAPHY: No results found.     IMPRESSION/PLAN: 1. Stage IA, [id number], grade 2 ER/PR positive invasive ductal carcinoma of the left breast. Dr. Lisbeth Renshaw discusses the pathology findings and reviews the nature of invasive breast disease. The consensus from the breast conference include breast conservation with lumpectomy with sentinel mapping.  It does not appear that there would be a role for systemic therapy, so we don't anticipate oncotype dx  testing. Her adjuvant recommendations would include either external beam radiotherapy versus antiestrogen therapy or consideration of combination therapy. We discussed the risks, benefits, short, and long term effects of radiotherapy, and the patient is interested in antiestrogen therapy. We will review her final decision making at conference and would be happy to see her back as needed moving forward.  In a visit lasting 45  minutes, greater than 50% of the time was spent face to face discussing her case, and coordinating the patient's  care.    The above documentation reflects my direct findings during this shared patient visit. Please see the separate note by Dr. Lisbeth Renshaw on this date for the remainder of the patient's plan of care.    Carola Rhine, PAC

## 2017-07-21 ENCOUNTER — Telehealth: Payer: Self-pay | Admitting: Oncology

## 2017-07-21 ENCOUNTER — Telehealth: Payer: Self-pay

## 2017-07-21 NOTE — Telephone Encounter (Signed)
Per 4/3 no los

## 2017-07-21 NOTE — Telephone Encounter (Signed)
   Primary Cardiologist:David Ellyn Hack, MD  Chart reviewed as part of pre-operative protocol coverage. Because of [name]'s past medical history and time since last visit, he/she will require a follow-up visit in order to better assess preoperative cardiovascular risk.  Pre-op covering staff: - Please schedule appointment and call patient to inform them. - Please contact requesting surgeon's office via preferred method (i.e, phone, fax) to inform them of need for appointment prior to surgery.  Rosaria Ferries, PA-C  07/21/2017, 5:23 PM

## 2017-07-21 NOTE — Telephone Encounter (Signed)
   [facility] Pre-operative Risk Assessment    Request for surgical clearance:  1. What type of surgery is being performed? Breast surgery  2. When is this surgery scheduled? TBD  3. What type of clearance is required (medical clearance vs. Pharmacy clearance to hold med vs. Both)? Both  4. Are there any medications that need to be held prior to surgery and how long? Aspirin  5. Practice name and name of physician performing surgery? [facility]  Dr.Thomas Cornett  6. What is your office phone and fax number? Phone # [phone]   Fax # [phone]  7. Anesthesia type (None, local, MAC, general) ? General   [name] 07/21/2017, 1:28 PM  _________________________________________________________________   (provider comments below)

## 2017-07-22 NOTE — Telephone Encounter (Signed)
Pt have an appt with Jory Sims on April 9th for surgical clearance

## 2017-07-26 ENCOUNTER — Other Ambulatory Visit: Payer: Self-pay | Admitting: Cardiology

## 2017-07-26 ENCOUNTER — Encounter: Payer: Self-pay | Admitting: Adult Health

## 2017-07-26 ENCOUNTER — Ambulatory Visit: Payer: Medicare HMO | Admitting: Adult Health

## 2017-07-26 VITALS — BP 132/74 | HR 79 | Ht 64.5 in | Wt 148.4 lb

## 2017-07-26 DIAGNOSIS — I714 Abdominal aortic aneurysm, without rupture, unspecified: Secondary | ICD-10-CM

## 2017-07-26 DIAGNOSIS — I358 Other nonrheumatic aortic valve disorders: Secondary | ICD-10-CM | POA: Diagnosis not present

## 2017-07-26 DIAGNOSIS — I2510 Atherosclerotic heart disease of native coronary artery without angina pectoris: Secondary | ICD-10-CM | POA: Diagnosis not present

## 2017-07-26 DIAGNOSIS — Z01810 Encounter for preprocedural cardiovascular examination: Secondary | ICD-10-CM | POA: Diagnosis not present

## 2017-07-26 DIAGNOSIS — I10 Essential (primary) hypertension: Secondary | ICD-10-CM | POA: Diagnosis not present

## 2017-07-26 DIAGNOSIS — I739 Peripheral vascular disease, unspecified: Secondary | ICD-10-CM

## 2017-07-26 NOTE — Patient Instructions (Signed)
If you need a refill on your cardiac medications before your next appointment, please call your pharmacy.  Special Instructions: CLEARED FOR LEFT BREAST SURGERY WITH DR CORVETT, WE WILL NOTIFY TODAY  Follow-Up: MAKE SURE TO KEEP SCHEDULED APPT's   Thank you for choosing [facility] at [facility]!!

## 2017-07-26 NOTE — Progress Notes (Signed)
Cardiology Office Note   Date:  07/26/2017   ID:  [name], DOB March 27, 1930, MRN [id number]  PCP:  Drake Leach, MD  Cardiologist:  Dr. Ellyn Hack.  Chief Complaint  Patient presents with  . Coronary Artery Disease  . Hypertension  . Pre-op Exam     History of Present Illness: [name] is a 84 y.o. female who presents for pre-operative evaluation with known history of PAD and CAD with PCI. Saw Dr. Gwenlyn Found had diamondback orbital rotational atherectomy with stenting of the ostial right common iliac artery. She also has a distant history of left iliac stent in 2007.Cardiac cath 10/2013 due to angina. This showed diffuse severe in-stent-stenosis in the RCA but with brisk collaterals. Not change from prior and therefore medical management was recommended.  She was started on amlodipine.   She was last seen by Dr. Ellyn Hack on 02/11/2016.Due to history of orthostatic BPs she was allowed ot have permissive hypertension. She is not longer on statin therapy due to fatigue.   She comes today for pre-operative evaluation for L breast lumpectomy. She plans to have surgical removal of lump after she is cleared by cardiology. Dr. Erroll Luna of [facility].   She is legally blind due to macular degeneration. She denies chest pain or palpitations. She has chronic dyspnea but no worse than in the past. She has generalized fatigue. She is medically complaint, however, she has stopped taking ASA on her own.   Past Medical History:  Diagnosis Date  . Abdominal aortic aneurysm ([place])    asymptomatic, 2012 -  2.6 x 2.4cm  . Anxiety   . CAD S/P percutaneous coronary angioplasty    Cath 2010: 100% occlusion of RCA  (2 BMS stents finally occluded) w/ left to right collaterals; moderate LAD and circumflex disease.  EF 55-60%;; cath 2015 with medical therapy recommended   . Chronic lower back pain   . Daily headache       . Depression   . Exertional shortness of breath   . History of  stomach ulcers   . Hyperlipidemia   . Hypertension   . Hypothyroidism   . Macular degeneration    "started out dry; now is wet" (01/08/2013)  . Migraines       . Myocardial infarction ([place]) 2003  . PAD (peripheral artery disease) ([place]) 12/2005   s/p L Common Iliac Stent  . PONV (postoperative nausea and vomiting)   . Walking pneumonia 1970's    Past Surgical History:  Procedure Laterality Date  . ABD AOTRA DOPPLER  08/14/2012   distal mild dilatation 2.6 x 2.5cm unchanged;right cia 70-99%  new finding  . Abdominal Aorta Duplex  09/2014   No change from prior study; 2.6 x 2.4 cm mild AAA. R Com Iliac A  > 50%. -- f/u 24 months.  . ABDOMINAL AORTAGRAM  01/08/2013   Procedure: ABDOMINAL Maxcine Ham;  Surgeon: Lorretta Harp, MD;  Location: [facility] CATH LAB;  Service: Cardiovascular;;  . ANGIOPLASTY Right 01/08/2013   unsucess stenting RLE   . APPENDECTOMY    . CARDIAC CATHETERIZATION  11/2008   diffuse ISR in  RCA w/ good L-R collaterals; AV groove Cx - 60-70% stenosis with post stenosis ectasia;  EF 55%, mild  CAD in LAD -- Med Rx  . CATARACT EXTRACTION W/ INTRAOCULAR LENS  IMPLANT, BILATERAL Bilateral   . CHOLECYSTECTOMY    . CORONARY ANGIOPLASTY  April 2006   Cutting Balloon PTCA of RCA ISR  . CORONARY  ANGIOPLASTY WITH STENT PLACEMENT  Jan 2004   RCA - 2 overlapping Pixel BMS 2.0 mm x 18 mm & 2.0 mm x 23 m   . DILATION AND CURETTAGE OF UTERUS     "couple over the years" (01/08/2013)  . ILIAC ARTERY STENT Left 12/30/2005   PTA anddirect stenting  of left common iliac artery - 8.0 x 18 mm OmniLink stent  . ILIAC ARTERY STENT Right 02/13/2013   successful diamondback orbital rotation lipectomy, PTA and stenting of highly calcified ostial common iliac artery/notes 02/13/2013  . LEFT HEART CATHETERIZATION WITH CORONARY ANGIOGRAM N/A 11/13/2013   Procedure: LEFT HEART CATHETERIZATION WITH CORONARY ANGIOGRAM;  Surgeon: Leonie Man, MD; LAD 40%, CFX 60%, OM1 60%, RCA diffuse in-stent  restenosis up to 99%, small PDA and PLA, collaterals from left system noted, EF 55-60 percent  . LOWER EXTREMITY ANGIOGRAM N/A 01/08/2013   Procedure: LOWER EXTREMITY ANGIOGRAM;  Surgeon: Lorretta Harp, MD;  Location: [facility] CATH LAB;  Service: Cardiovascular;  Laterality: N/A;  . NM MYOCAR PERF WALL MOTION  12/27/2011 -[place]   LV normal,EF 81 %  . NM MYOCAR PERF WALL MOTION  01/2015   EF 56%. Normal ROM of low risk. Small defect of mild severity and apical anterior location likely related to breast attenuation. Cannot rule out small area of mild ischemia.  . TRANSTHORACIC ECHOCARDIOGRAM  10/2013   Normal LV size low normal function. EF 50-55%. No regional wall motion abnormality. GR 1 DD. PAP ~ 47 mmHg     Current Outpatient Medications  Medication Sig Dispense Refill  . ALPRAZolam (XANAX) 0.5 MG tablet Take 0.5 mg by mouth daily as needed for anxiety.    [name] amLODipine (NORVASC) 2.5 MG tablet Take 1 tablet (2.5 mg total) by mouth daily. KEEP OV. 90 tablet 0  . levothyroxine (SYNTHROID, LEVOTHROID) 100 MCG tablet Take 100 mcg by mouth daily.    . nitroGLYCERIN (NITROSTAT) 0.4 MG SL tablet Place 1 tablet (0.4 mg total) under the tongue every 5 (five) minutes as needed for chest pain. 25 tablet 4  . Omega-3 Fatty Acids (FISH OIL) 1000 MG CAPS Take by mouth daily.    . ranitidine (ZANTAC) 150 MG tablet Take 150 mg by mouth as directed.    . sertraline (ZOLOFT) 25 MG tablet Take 25 mg by mouth daily.    . trandolapril (MAVIK) 2 MG tablet TAKE 1 TABLET EVERY DAY 60 tablet 0   No current facility-administered medications for this visit.     Allergies:   Benadryl [diphenhydramine]; Levofloxacin; Avastin [bevacizumab]; Beta adrenergic blockers; Hydrocodone-acetaminophen; Hydrocodone-acetaminophen; Other; and Trazodone    Social History:  The patient  reports that she quit smoking about 29 years ago. Her smoking use included cigarettes. She has a 40.00 pack-year smoking history. She has never  used smokeless tobacco. She reports that she does not drink alcohol or use drugs.   Family History:  The patient's family history is not on file.    ROS: All other systems are reviewed and negative. Unless otherwise mentioned in H&P    PHYSICAL EXAM: VS:  BP 132/74   Pulse 79   Ht 5' 4.5" (1.638 m)   Wt 148 lb 6.4 oz (67.3 kg)   BMI 25.08 kg/m  , BMI Body mass index is 25.08 kg/m. GEN: Well nourished, well developed, in no acute distress  HEENT: normal  Neck: no JVD, carotid bruits, or masses Cardiac: RRR; no murmurs, rubs, or gallops,no edema  Respiratory:  Clear to  auscultation bilaterally, normal work of breathing GI: soft, nontender, nondistended, + BS MS: no deformity or atrophy  Skin: warm and dry, no rash Neuro:  Strength and sensation are intact Psych: euthymic mood, flat affect   EKG:  NSR rate of 79 bpm with frequent PVC's   Recent Labs: No results found for requested labs within last 8760 hours.    Lipid Panel    Component Value Date/Time   CHOL  11/27/2008 0425    117        ATP III CLASSIFICATION:  <200     mg/dL   Desirable  200-239  mg/dL   Borderline High  >=240    mg/dL   High          TRIG 198 (H) 11/27/2008 0425   HDL 29 (L) 11/27/2008 0425   CHOLHDL 4.0 11/27/2008 0425   VLDL 40 11/27/2008 0425   LDLCALC  11/27/2008 0425    48        Total Cholesterol/HDL:CHD Risk Coronary Heart Disease Risk Table                     Men   Women  1/2 Average Risk   3.4   3.3  Average Risk       5.0   4.4  2 X Average Risk   9.6   7.1  3 X Average Risk  23.4   11.0        Use the calculated Patient Ratio above and the CHD Risk Table to determine the patient's CHD Risk.        ATP III CLASSIFICATION (LDL):  <100     mg/dL   Optimal  100-129  mg/dL   Near or Above                    Optimal  130-159  mg/dL   Borderline  160-189  mg/dL   High  >190     mg/dL   Very High      Wt Readings from Last 3 Encounters:  07/26/17 148 lb 6.4 oz (67.3 kg)    07/20/17 149 lb (67.6 kg)  02/11/16 147 lb 12.8 oz (67 kg)      Other studies Reviewed: Echocardiogram 2013-11-25 Left ventricle: The cavity size was normal. Wall thickness was normal. Systolic function was normal. The estimated ejection fraction was in the range of 50% to 55%. Wall motion was normal; there were no regional wall motion abnormalities. Doppler parameters are consistent with abnormal left ventricular relaxation (grade 1 diastolic dysfunction). - Mitral valve: There was mild regurgitation. - Tricuspid valve: There was mild-moderate regurgitation. - Pulmonary arteries: PA peak pressure: 47 mm Hg (S).  Impressions:  - Low normal LV function; grade 1 diastolic dysfunction; mild to moderate TR; mild MR.  Stress Myoview 01/21/2015 Study Highlights    Nuclear stress EF: 56%.  The left ventricular ejection fraction is normal (55-65%).  There was no ST segment deviation noted during stress.  No T wave inversion was noted during stress.  Defect 1: There is a small defect of mild severity present in the apical anterior location that is likely due to breast attenuation, but cannot rule out a small area of mild ischemia.  The study is normal.  This is a low risk study.    ASSESSMENT AND PLAN:  1.  Pre-Operative Cardiology evaluation: I have reviewed recent stress test and echo. She is asymptomatic from cardiac standpoint. She has stopped taking  ASA on her own. As she appears stable from cardiac standpoint is cleared to proceed with left breat lumpectomy. No medications are changed or added peri-operatively at this time.     Chart reviewed as part of pre-operative protocol coverage. Given past medical history and time since last visit, based on ACC/AHA guidelines, [name] would be at acceptable risk for the planned procedure without further cardiovascular testing. Labs are deferred to surgery pre-operatively. Would include Mg along with BMET due to  frequent ectopy noted on EKG.   2. CAD:  Hx of PCI. Most recent cardiac cath in 2010 100% occlusion of RCA  (2 BMS stents finally occluded) w/ left to right collaterals; moderate LAD and circumflex disease.  EF 55-60%;; cath 2015 with medical therapy recommended. She does not take statin in the setting of muscle fatigue.   3. Hypertension: BP is low normal. She is to have permissive hypertension due to intolerance of low normal BP. She will continue amlodipine and trandolapril as directed.   4. PAD: She is scheduled for doppler ultrasound of aorta and iliac arteries and ABI's scheduled in May 2019 with follow up with Dr. Ellyn Hack.   Current medicines are reviewed at length with the patient today.    Labs/ tests ordered today include: None  [name], ANP, AACC   07/26/2017 3:46 PM    [facility] [address] [place], [place] [address] Phone: [phone]; Fax: [phone]

## 2017-07-28 ENCOUNTER — Telehealth: Payer: Self-pay | Admitting: *Deleted

## 2017-07-28 NOTE — Telephone Encounter (Signed)
  Oncology Nurse Navigator Documentation  Navigator Location: [facility] (07/28/17 1200)   )Navigator Encounter Type: Telephone;MDC Follow-up (07/28/17 1200) Telephone: Outgoing Call;Clinic/MDC Follow-up (07/28/17 1200)     Surgery Date: 08/23/17 (07/28/17 1200)                                            Time Spent with Patient: 15 (07/28/17 1200)

## 2017-08-09 ENCOUNTER — Other Ambulatory Visit: Payer: Self-pay | Admitting: Cardiology

## 2017-08-09 DIAGNOSIS — I714 Abdominal aortic aneurysm, without rupture, unspecified: Secondary | ICD-10-CM

## 2017-08-09 DIAGNOSIS — I739 Peripheral vascular disease, unspecified: Secondary | ICD-10-CM

## 2017-08-09 NOTE — Pre-Procedure Instructions (Signed)
[name]  08/09/2017      [facility] - [place] - [address] [place] [address] Phone: [phone] Fax: [phone]  [facility] [address] [place], [place] [address] [place] [address] Phone: [phone] Fax: [facility] [place], [place] [address] Phone: [phone] Fax: [phone]    Your procedure is scheduled on May 7  Report to [facility] Admitting at 0900 A.M.  Call this number if you have problems the morning of surgery:  [phone]   Remember:  Do not eat food or drink liquids after midnight.  Please complete your PRE-SURGERY ENSURE that was given to before you leave your house the morning of surgery.  Please, if able, drink it in one setting. DO NOT SIP.   Take these medicines the morning of surgery with A SIP OF WATER  ALPRAZolam (XANAX) amLODipine (NORVASC) levothyroxine (SYNTHROID, LEVOTHROID)  omeprazole (PRILOSEC)  ranitidine (ZANTAC)  7 days prior to surgery STOP taking any Aspirin(unless otherwise instructed by your surgeon), Aleve, Naproxen, Ibuprofen, Motrin, Advil, Goody's, BC's, all herbal medications, fish oil, and all vitamins    Do not wear jewelry, make-up or nail polish.  Do not wear lotions, powders, or perfumes, or deodorant.  Do not shave 48 hours prior to surgery.   Do not bring valuables to the hospital.  [facility] is not responsible for any belongings or valuables.  Contacts, dentures or bridgework may not be worn into surgery.  Leave your suitcase in the car.  After surgery it may be brought to your room.  For patients admitted to the hospital, discharge time will be determined by your treatment team.  Patients discharged the day of surgery will not be allowed to drive home.    Special instructions:   [facility]- Preparing For Surgery  Before surgery, you can play an important role. Because skin is not sterile, your skin needs to be as free of germs as possible. You can reduce the number of germs on your skin by washing with CHG (chlorahexidine gluconate) Soap before surgery.  CHG is an antiseptic cleaner which kills germs and bonds with the skin to continue killing germs even after washing.  Please do not use if you have an allergy to CHG or antibacterial soaps. If your skin becomes reddened/irritated stop using the CHG.  Do not shave (including legs and underarms) for at least 48 hours prior to first CHG shower. It is OK to shave your face.  Please follow these instructions carefully.   1. Shower the NIGHT BEFORE SURGERY and the MORNING OF SURGERY with CHG.   2. If you chose to wash your hair, wash your hair first as usual with your normal shampoo.  3. After you shampoo, rinse your hair and body thoroughly to remove the shampoo.  4. Use CHG as you would any other liquid soap. You can apply CHG directly to the skin and wash gently with a scrungie or a clean washcloth.   5. Apply the CHG Soap to your body ONLY FROM THE NECK DOWN.  Do not use on open wounds or open sores. Avoid contact with your eyes, ears, mouth and genitals (private parts). Wash Face and genitals (private parts)  with your normal soap.  6. Wash thoroughly, paying special attention to the area where your surgery  will be performed.  7. Thoroughly rinse your body with warm water from the neck down.  8. DO NOT shower/wash with your normal soap after using and rinsing off the CHG Soap.  9. Pat yourself dry with a CLEAN TOWEL.  10. Wear CLEAN PAJAMAS to bed the night before surgery, wear comfortable clothes the morning of surgery  11. Place CLEAN SHEETS on your bed the night of your first shower and DO NOT SLEEP WITH PETS.    Day of Surgery: Do not apply any deodorants/lotions. Please wear clean clothes to the  hospital/surgery center.      Please read over the following fact sheets that you were given.

## 2017-08-10 ENCOUNTER — Encounter (HOSPITAL_COMMUNITY)
Admission: RE | Admit: 2017-08-10 | Discharge: 2017-08-10 | Disposition: A | Discharge status: Home / Self Care | Payer: Medicare HMO | Source: Ambulatory Visit | Attending: Surgery | Admitting: Surgery

## 2017-08-10 ENCOUNTER — Other Ambulatory Visit: Payer: Self-pay

## 2017-08-10 ENCOUNTER — Encounter (HOSPITAL_COMMUNITY): Payer: Self-pay

## 2017-08-10 DIAGNOSIS — Z01812 Encounter for preprocedural laboratory examination: Secondary | ICD-10-CM | POA: Insufficient documentation

## 2017-08-10 HISTORY — DX: Gastro-esophageal reflux disease without esophagitis: K21.9

## 2017-08-10 HISTORY — DX: Dyspnea, unspecified: R06.00

## 2017-08-10 HISTORY — DX: Personal history of urinary calculi: Z87.442

## 2017-08-10 LAB — BASIC METABOLIC PANEL
Anion gap: 8 (ref 5–15)
BUN: 11 mg/dL (ref 6–20)
CALCIUM: 9.3 mg/dL (ref 8.9–10.3)
CO2: 24 mmol/L (ref 22–32)
CREATININE: 0.72 mg/dL (ref 0.44–1.00)
Chloride: 106 mmol/L (ref 101–111)
GFR calc Af Amer: >60 mL/min (ref >60)
GLUCOSE: 90 mg/dL (ref 65–99)
Potassium: 4.3 mmol/L (ref 3.5–5.1)
Sodium: 138 mmol/L (ref 135–145)

## 2017-08-10 LAB — CBC
HEMATOCRIT: 40.8 % (ref 36.0–46.0)
Hemoglobin: 13.3 g/dL (ref 12.0–15.0)
MCH: 32.3 pg (ref 26.0–34.0)
MCHC: 32.6 g/dL (ref 30.0–36.0)
MCV: 99 fL (ref 78.0–100.0)
PLATELETS: 192 10*3/uL (ref 150–400)
RBC: 4.12 MIL/uL (ref 3.87–5.11)
RDW: 12.4 % (ref 11.5–15.5)
WBC: 6 10*3/uL (ref 4.0–10.5)

## 2017-08-10 NOTE — Progress Notes (Signed)
PCP: Dr. Drake Leach [facility] in [place]  Cardiologist: Dr. Geoffry Paradise NP

## 2017-08-10 NOTE — Progress Notes (Signed)
Anesthesia Chart Review:   Case:  [id number] Date/Time:  08/23/17 1045   Procedure:  BREAST LUMPECTOMY WITH RADIOACTIVE SEED LOCALIZATION (Left Breast)   Anesthesia type:  General   Pre-op diagnosis:  LEFT BREAST CANCER   Location:  [facility] OR ROOM 02 / [place] OR   Surgeon:  Erroll Luna, MD      DISCUSSION: Pt is an 84 year old female with hx CAD (stent to RCA has in-stent restenosis 2015 but has collaterals) and PAD (s/p stent to R CIA). Pt has cardiac clearance for surgery.     VS: BP (!) 144/58   Pulse (!) 57   Temp 36.4 C   Resp 18   Ht 5' 4.5" (1.638 m)   Wt 147 lb (66.7 kg)   SpO2 98%   BMI 24.84 kg/m   PROVIDERS: PCP is Drake Leach, MD (notes in care everywhere)  Patient Care Team: Magrinat, Virgie Dad, MD as Consulting Physician (Oncology) Erroll Luna, MD as Consulting Physician (General Surgery) Kyung Rudd, MD as Consulting Physician (Radiation Oncology) Leonie Man, MD as Consulting Physician (Cardiology) Juanita Craver, MD as Consulting Physician (Gastroenterology)   - Last cardiology visit 07/26/17 with Jory Sims, NP who cleared pt for surgery.     LABS: Labs reviewed: Acceptable for surgery. (all labs ordered are listed, but only abnormal results are displayed)  Labs Reviewed  BASIC METABOLIC PANEL  CBC     EKG 07/26/17: NSR rate of 79 bpm with frequent PVC's    CV:  AAA duplex 09/02/16:  - Technically challenging study. - Stable infrarenal fusiform dilatation in the distal aorta, not technically aneurysmal, measuring 2.8 cm x 2.8 cm. - Normal caliber common and external iliac arteries, bilaterally. - Aorto-iliac atherosclerosis. - Decrease in right common iliac artery velocities, now in normal range, s/p stent. - Stable in normal range left common iliac artery, s/p stent. - Normal right external iliac artery. - >50% left external iliac artery. - Patent IVC.  Nuclear stress test 01/21/15:   Nuclear stress EF: 56%.  The left  ventricular ejection fraction is normal (55-65%).  There was no ST segment deviation noted during stress.  No T wave inversion was noted during stress.  Defect 1: There is a small defect of mild severity present in the apical anterior location that is likely due to breast attenuation, but cannot rule out a small area of mild ischemia.  The study is normal.  This is a low risk study  Cardiac cath 11/13/13:  Coronary Anatomy:  Dominance:  Right  Left Main:  Large-caliber vessel that bifurcates into the LAD and Circumflex. LAD: Normal caliber wraparound vessel There are 2 major small caliber diagonal branches. Beyond D2 there is a roughly 40% stenosis; otherwise minimal luminal irregularity  Left Circumflex:  Large-caliber somewhat tortuous vessel with a roughly 60% focal lesion prior to giving off a small AV groove branch. The best the vessel becomes a major lateral OM with several small branches. It branches distally with 2 small branches having roughly 60% "ostial "lesions. The proximal lesion is relatively stable improved in comparison to the last catheterization in 2008.   RCA:  Very small caliber vessel with an almost anterior takeoff. There is a very long-standing segment is diffusely diseased with minimal differences compared to the previous catheterization. The vessel does bifurcate distally he has a relatively in normal but small caliber PDA and Right Posterior AV Groove Branch (RPAV). There are brisk Left to Right Collaterals from the LAD septal perforators,  wraparound LAD and the AV groove branch the circumflex.   Diffuse luminal irregularity is noted in the PDA and PL  When compared to previous catheterization films, there was no significant change in the RCA distribution I do with the native flow or the collateral flow. The moderate severe lesion in the circumflex x-ray could be more stabilized. No culprit lesion.    Past Medical History:  Diagnosis Date  . Abdominal  aortic aneurysm ([place])    asymptomatic, 2012 -  2.6 x 2.4cm  . Anxiety   . CAD S/P percutaneous coronary angioplasty    Cath 2010: 100% occlusion of RCA  (2 BMS stents finally occluded) w/ left to right collaterals; moderate LAD and circumflex disease.  EF 55-60%;; cath 2015 with medical therapy recommended   . Chronic lower back pain   . Daily headache       . Depression   . Dyspnea    on exertion sometimes  . Exertional shortness of breath   . GERD (gastroesophageal reflux disease)   . History of kidney stones   . History of stomach ulcers   . Hyperlipidemia   . Hypertension   . Hypothyroidism   . Macular degeneration    "started out dry; now is wet" (01/08/2013)  . Migraines       . Myocardial infarction ([place]) 2003  . PAD (peripheral artery disease) ([place]) 12/2005   s/p L Common Iliac Stent  . PONV (postoperative nausea and vomiting)   . Walking pneumonia 1970's    Past Surgical History:  Procedure Laterality Date  . ABD AOTRA DOPPLER  08/14/2012   distal mild dilatation 2.6 x 2.5cm unchanged;right cia 70-99%  new finding  . Abdominal Aorta Duplex  09/2014   No change from prior study; 2.6 x 2.4 cm mild AAA. R Com Iliac A  > 50%. -- f/u 24 months.  . ABDOMINAL AORTAGRAM  01/08/2013   Procedure: ABDOMINAL Maxcine Ham;  Surgeon: Lorretta Harp, MD;  Location: [facility] CATH LAB;  Service: Cardiovascular;;  . ANGIOPLASTY Right 01/08/2013   unsucess stenting RLE   . APPENDECTOMY    . CARDIAC CATHETERIZATION  11/2008   diffuse ISR in  RCA w/ good L-R collaterals; AV groove Cx - 60-70% stenosis with post stenosis ectasia;  EF 55%, mild  CAD in LAD -- Med Rx  . CATARACT EXTRACTION W/ INTRAOCULAR LENS  IMPLANT, BILATERAL Bilateral   . CHOLECYSTECTOMY    . CORONARY ANGIOPLASTY  April 2006   Cutting Balloon PTCA of RCA ISR  . CORONARY ANGIOPLASTY WITH STENT PLACEMENT  Jan 2004   RCA - 2 overlapping Pixel BMS 2.0 mm x 18 mm & 2.0 mm x 23 m   . DILATION AND CURETTAGE OF UTERUS     "couple  over the years" (01/08/2013)  . ILIAC ARTERY STENT Left 12/30/2005   PTA anddirect stenting  of left common iliac artery - 8.0 x 18 mm OmniLink stent  . ILIAC ARTERY STENT Right 02/13/2013   successful diamondback orbital rotation lipectomy, PTA and stenting of highly calcified ostial common iliac artery/notes 02/13/2013  . LEFT HEART CATHETERIZATION WITH CORONARY ANGIOGRAM N/A 11/13/2013   Procedure: LEFT HEART CATHETERIZATION WITH CORONARY ANGIOGRAM;  Surgeon: Leonie Man, MD; LAD 40%, CFX 60%, OM1 60%, RCA diffuse in-stent restenosis up to 99%, small PDA and PLA, collaterals from left system noted, EF 55-60 percent  . LOWER EXTREMITY ANGIOGRAM N/A 01/08/2013   Procedure: LOWER EXTREMITY ANGIOGRAM;  Surgeon: Lorretta Harp, MD;  Location:  [facility] CATH LAB;  Service: Cardiovascular;  Laterality: N/A;  . NM MYOCAR PERF WALL MOTION  12/27/2011 -[place]   LV normal,EF 81 %  . NM MYOCAR PERF WALL MOTION  01/2015   EF 56%. Normal ROM of low risk. Small defect of mild severity and apical anterior location likely related to breast attenuation. Cannot rule out small area of mild ischemia.  . TRANSTHORACIC ECHOCARDIOGRAM  10/2013   Normal LV size low normal function. EF 50-55%. No regional wall motion abnormality. GR 1 DD. PAP ~ 47 mmHg    MEDICATIONS: . ALPRAZolam (XANAX) 0.5 MG tablet  . amLODipine (NORVASC) 2.5 MG tablet  . Cholecalciferol (VITAMIN D-3) 5000 units TABS  . levothyroxine (SYNTHROID, LEVOTHROID) 100 MCG tablet  . nitroGLYCERIN (NITROSTAT) 0.4 MG SL tablet  . Omega-3 Fatty Acids (FISH OIL) 1000 MG CAPS  . omeprazole (PRILOSEC) 40 MG capsule  . Polyethyl Glycol-Propyl Glycol (LUBRICANT EYE DROPS) 0.4-0.3 % SOLN  . ranitidine (ZANTAC) 150 MG tablet  . sertraline (ZOLOFT) 100 MG tablet  . trandolapril (MAVIK) 2 MG tablet   No current facility-administered medications for this encounter.     If no changes, I anticipate pt can proceed with surgery as scheduled.   Willeen Cass,  FNP-BC [facility] Short Stay Surgical Center/Anesthesiology Phone: [phone] 08/10/2017 4:07 PM

## 2017-08-23 ENCOUNTER — Encounter (HOSPITAL_COMMUNITY): Payer: Self-pay | Admitting: *Deleted

## 2017-08-23 ENCOUNTER — Ambulatory Visit (HOSPITAL_COMMUNITY)
Admission: RE | Admit: 2017-08-23 | Discharge: 2017-08-23 | Disposition: A | Discharge status: Home / Self Care | Payer: Medicare HMO | Source: Ambulatory Visit | Attending: Surgery | Admitting: Surgery

## 2017-08-23 ENCOUNTER — Encounter (HOSPITAL_COMMUNITY)
Admission: RE | Disposition: A | Discharge status: Home / Self Care | Payer: Self-pay | Source: Ambulatory Visit | Attending: Surgery

## 2017-08-23 ENCOUNTER — Other Ambulatory Visit: Payer: Self-pay

## 2017-08-23 ENCOUNTER — Ambulatory Visit (HOSPITAL_COMMUNITY): Payer: Medicare HMO | Admitting: Emergency Medicine

## 2017-08-23 ENCOUNTER — Ambulatory Visit (HOSPITAL_COMMUNITY): Payer: Medicare HMO | Admitting: Anesthesiology

## 2017-08-23 DIAGNOSIS — I252 Old myocardial infarction: Secondary | ICD-10-CM | POA: Diagnosis not present

## 2017-08-23 DIAGNOSIS — I110 Hypertensive heart disease with heart failure: Secondary | ICD-10-CM | POA: Diagnosis not present

## 2017-08-23 DIAGNOSIS — I509 Heart failure, unspecified: Secondary | ICD-10-CM | POA: Diagnosis not present

## 2017-08-23 DIAGNOSIS — Z885 Allergy status to narcotic agent status: Secondary | ICD-10-CM | POA: Diagnosis not present

## 2017-08-23 DIAGNOSIS — Z87891 Personal history of nicotine dependence: Secondary | ICD-10-CM | POA: Insufficient documentation

## 2017-08-23 DIAGNOSIS — Z79899 Other long term (current) drug therapy: Secondary | ICD-10-CM | POA: Diagnosis not present

## 2017-08-23 DIAGNOSIS — F329 Major depressive disorder, single episode, unspecified: Secondary | ICD-10-CM | POA: Diagnosis not present

## 2017-08-23 DIAGNOSIS — Z8719 Personal history of other diseases of the digestive system: Secondary | ICD-10-CM | POA: Insufficient documentation

## 2017-08-23 DIAGNOSIS — Z9861 Coronary angioplasty status: Secondary | ICD-10-CM | POA: Insufficient documentation

## 2017-08-23 DIAGNOSIS — E7800 Pure hypercholesterolemia, unspecified: Secondary | ICD-10-CM | POA: Insufficient documentation

## 2017-08-23 DIAGNOSIS — Z888 Allergy status to other drugs, medicaments and biological substances status: Secondary | ICD-10-CM | POA: Insufficient documentation

## 2017-08-23 DIAGNOSIS — Z170 Estrogen receptor positive status [ER+]: Secondary | ICD-10-CM | POA: Diagnosis not present

## 2017-08-23 DIAGNOSIS — E039 Hypothyroidism, unspecified: Secondary | ICD-10-CM | POA: Diagnosis not present

## 2017-08-23 DIAGNOSIS — K219 Gastro-esophageal reflux disease without esophagitis: Secondary | ICD-10-CM | POA: Insufficient documentation

## 2017-08-23 DIAGNOSIS — F419 Anxiety disorder, unspecified: Secondary | ICD-10-CM | POA: Insufficient documentation

## 2017-08-23 DIAGNOSIS — Z8249 Family history of ischemic heart disease and other diseases of the circulatory system: Secondary | ICD-10-CM | POA: Insufficient documentation

## 2017-08-23 DIAGNOSIS — C50912 Malignant neoplasm of unspecified site of left female breast: Secondary | ICD-10-CM | POA: Insufficient documentation

## 2017-08-23 DIAGNOSIS — I493 Ventricular premature depolarization: Secondary | ICD-10-CM | POA: Insufficient documentation

## 2017-08-23 DIAGNOSIS — I2510 Atherosclerotic heart disease of native coronary artery without angina pectoris: Secondary | ICD-10-CM | POA: Diagnosis not present

## 2017-08-23 HISTORY — PX: BREAST LUMPECTOMY WITH RADIOACTIVE SEED LOCALIZATION: SHX6424

## 2017-08-23 SURGERY — BREAST LUMPECTOMY WITH RADIOACTIVE SEED LOCALIZATION
Anesthesia: General | Site: Breast | Laterality: Left

## 2017-08-23 MED ORDER — MEPERIDINE HCL 50 MG/ML IJ SOLN: 6.2500 mg | INTRAMUSCULAR | Status: DC | PRN

## 2017-08-23 MED ORDER — GABAPENTIN 300 MG PO CAPS
300.0000 mg | ORAL_CAPSULE | ORAL | Status: AC
  Administered 2017-08-23: 300 mg via ORAL
  Filled 2017-08-23: qty 1

## 2017-08-23 MED ORDER — LACTATED RINGERS IV SOLN
INTRAVENOUS | Status: DC
  Administered 2017-08-23: 10:00:00 via INTRAVENOUS

## 2017-08-23 MED ORDER — MIDAZOLAM HCL 5 MG/5ML IJ SOLN
INTRAMUSCULAR | Status: DC | PRN
  Administered 2017-08-23: 1 mg via INTRAVENOUS

## 2017-08-23 MED ORDER — FENTANYL CITRATE (PF) 100 MCG/2ML IJ SOLN
INTRAMUSCULAR | Status: AC
  Administered 2017-08-23: 25 ug via INTRAVENOUS
  Filled 2017-08-23: qty 2

## 2017-08-23 MED ORDER — FENTANYL CITRATE (PF) 250 MCG/5ML IJ SOLN
INTRAMUSCULAR | Status: AC
  Filled 2017-08-23: qty 5

## 2017-08-23 MED ORDER — MIDAZOLAM HCL 2 MG/2ML IJ SOLN
INTRAMUSCULAR | Status: AC
  Filled 2017-08-23: qty 2

## 2017-08-23 MED ORDER — PROPOFOL 10 MG/ML IV BOLUS
INTRAVENOUS | Status: DC | PRN
  Administered 2017-08-23: 100 mg via INTRAVENOUS

## 2017-08-23 MED ORDER — LIDOCAINE 2% (20 MG/ML) 5 ML SYRINGE
INTRAMUSCULAR | Status: DC | PRN
  Administered 2017-08-23: 60 mg via INTRAVENOUS

## 2017-08-23 MED ORDER — OXYCODONE HCL 5 MG PO TABS: 5.0000 mg | ORAL_TABLET | Freq: Once | ORAL | Status: DC | PRN

## 2017-08-23 MED ORDER — FENTANYL CITRATE (PF) 100 MCG/2ML IJ SOLN
INTRAMUSCULAR | Status: DC | PRN
  Administered 2017-08-23 (×3): 25 ug via INTRAVENOUS

## 2017-08-23 MED ORDER — HYDROCODONE-ACETAMINOPHEN 5-325 MG PO TABS: 1.0000 | ORAL_TABLET | Freq: Four times a day (QID) | ORAL | 0 refills | Status: DC | PRN

## 2017-08-23 MED ORDER — PROPOFOL 10 MG/ML IV BOLUS
INTRAVENOUS | Status: AC
  Filled 2017-08-23: qty 20

## 2017-08-23 MED ORDER — DEXAMETHASONE SODIUM PHOSPHATE 10 MG/ML IJ SOLN
INTRAMUSCULAR | Status: DC | PRN
  Administered 2017-08-23: 10 mg via INTRAVENOUS

## 2017-08-23 MED ORDER — HYDROCODONE-ACETAMINOPHEN 5-325 MG PO TABS
ORAL_TABLET | ORAL | Status: AC
  Administered 2017-08-23: 1
  Filled 2017-08-23: qty 1

## 2017-08-23 MED ORDER — 0.9 % SODIUM CHLORIDE (POUR BTL) OPTIME
TOPICAL | Status: DC | PRN
  Administered 2017-08-23: 1000 mL

## 2017-08-23 MED ORDER — BUPIVACAINE-EPINEPHRINE 0.25% -1:200000 IJ SOLN
INTRAMUSCULAR | Status: DC | PRN
  Administered 2017-08-23: 20 mL

## 2017-08-23 MED ORDER — CHLORHEXIDINE GLUCONATE CLOTH 2 % EX PADS: 6.0000 | MEDICATED_PAD | Freq: Once | CUTANEOUS | Status: DC

## 2017-08-23 MED ORDER — ACETAMINOPHEN 325 MG PO TABS: 325.0000 mg | ORAL_TABLET | ORAL | Status: DC | PRN

## 2017-08-23 MED ORDER — BUPIVACAINE-EPINEPHRINE (PF) 0.25% -1:200000 IJ SOLN
INTRAMUSCULAR | Status: AC
  Filled 2017-08-23: qty 30

## 2017-08-23 MED ORDER — OXYCODONE HCL 5 MG/5ML PO SOLN
5.0000 mg | Freq: Once | ORAL | Status: DC | PRN
Start: 2017-08-23 — End: 2017-08-23

## 2017-08-23 MED ORDER — CELECOXIB 200 MG PO CAPS
400.0000 mg | ORAL_CAPSULE | ORAL | Status: AC
  Administered 2017-08-23: 400 mg via ORAL
  Filled 2017-08-23: qty 2

## 2017-08-23 MED ORDER — ACETAMINOPHEN 160 MG/5ML PO SOLN: 325.0000 mg | ORAL | Status: DC | PRN

## 2017-08-23 MED ORDER — FENTANYL CITRATE (PF) 100 MCG/2ML IJ SOLN: 25.0000 ug | INTRAMUSCULAR | Status: DC | PRN

## 2017-08-23 MED ORDER — ONDANSETRON HCL 4 MG/2ML IJ SOLN
INTRAMUSCULAR | Status: DC | PRN
  Administered 2017-08-23: 4 mg via INTRAVENOUS

## 2017-08-23 MED ORDER — CEFAZOLIN SODIUM-DEXTROSE 2-4 GM/100ML-% IV SOLN
2.0000 g | INTRAVENOUS | Status: AC
  Administered 2017-08-23: 2 g via INTRAVENOUS
  Filled 2017-08-23: qty 100

## 2017-08-23 MED ORDER — PROMETHAZINE HCL 25 MG/ML IJ SOLN: 6.2500 mg | INTRAMUSCULAR | Status: DC | PRN

## 2017-08-23 MED ORDER — FENTANYL CITRATE (PF) 100 MCG/2ML IJ SOLN
25.0000 ug | INTRAMUSCULAR | Status: DC | PRN
  Administered 2017-08-23: 25 ug via INTRAVENOUS

## 2017-08-23 SURGICAL SUPPLY — 41 items
APPLIER CLIP 9.375 MED OPEN (MISCELLANEOUS)
BINDER BREAST LRG (GAUZE/BANDAGES/DRESSINGS) IMPLANT
BINDER BREAST XLRG (GAUZE/BANDAGES/DRESSINGS) ×3 IMPLANT
CANISTER SUCT 3000ML PPV (MISCELLANEOUS) IMPLANT
CHLORAPREP W/TINT 26ML (MISCELLANEOUS) ×3 IMPLANT
CLIP APPLIE 9.375 MED OPEN (MISCELLANEOUS) IMPLANT
COVER PROBE W GEL 5X96 (DRAPES) ×3 IMPLANT
COVER SURGICAL LIGHT HANDLE (MISCELLANEOUS) ×3 IMPLANT
DERMABOND ADVANCED (GAUZE/BANDAGES/DRESSINGS) ×2
DERMABOND ADVANCED .7 DNX12 (GAUZE/BANDAGES/DRESSINGS) ×1 IMPLANT
DEVICE DUBIN SPECIMEN MAMMOGRA (MISCELLANEOUS) ×3 IMPLANT
DRAPE CHEST BREAST 15X10 FENES (DRAPES) ×3 IMPLANT
ELECT CAUTERY BLADE 6.4 (BLADE) ×3 IMPLANT
ELECT REM PT RETURN 9FT ADLT (ELECTROSURGICAL) ×3
ELECTRODE REM PT RTRN 9FT ADLT (ELECTROSURGICAL) ×1 IMPLANT
GLOVE BIO SURGEON STRL SZ8 (GLOVE) ×3 IMPLANT
GLOVE BIOGEL PI IND STRL 8 (GLOVE) ×1 IMPLANT
GLOVE BIOGEL PI INDICATOR 8 (GLOVE) ×2
GOWN STRL REUS W/ TWL LRG LVL3 (GOWN DISPOSABLE) ×1 IMPLANT
GOWN STRL REUS W/ TWL XL LVL3 (GOWN DISPOSABLE) ×1 IMPLANT
GOWN STRL REUS W/TWL LRG LVL3 (GOWN DISPOSABLE) ×2
GOWN STRL REUS W/TWL XL LVL3 (GOWN DISPOSABLE) ×2
KIT BASIN OR (CUSTOM PROCEDURE TRAY) ×3 IMPLANT
KIT MARKER MARGIN INK (KITS) IMPLANT
LIGHT WAVEGUIDE WIDE FLAT (MISCELLANEOUS) IMPLANT
NEEDLE HYPO 25GX1X1/2 BEV (NEEDLE) IMPLANT
NS IRRIG 1000ML POUR BTL (IV SOLUTION) IMPLANT
PACK GENERAL/GYN (CUSTOM PROCEDURE TRAY) ×3 IMPLANT
PAD ABD 8X10 STRL (GAUZE/BANDAGES/DRESSINGS) ×3 IMPLANT
PENCIL BUTTON HOLSTER BLD 10FT (ELECTRODE) ×3 IMPLANT
SPONGE LAP 18X18 X RAY DECT (DISPOSABLE) IMPLANT
SUT MNCRL AB 4-0 PS2 18 (SUTURE) ×3 IMPLANT
SUT SILK 2 0 SH (SUTURE) IMPLANT
SUT VIC AB 2-0 SH 27 (SUTURE) ×2
SUT VIC AB 2-0 SH 27XBRD (SUTURE) ×1 IMPLANT
SUT VIC AB 3-0 SH 27 (SUTURE) ×2
SUT VIC AB 3-0 SH 27X BRD (SUTURE) ×1 IMPLANT
SYR BULB 3OZ (MISCELLANEOUS) ×3 IMPLANT
SYR CONTROL 10ML LL (SYRINGE) IMPLANT
TOWEL NATURAL 6PK STERILE (DISPOSABLE) IMPLANT
YANKAUER SUCT BULB TIP NO VENT (SUCTIONS) IMPLANT

## 2017-08-23 NOTE — Anesthesia Preprocedure Evaluation (Addendum)
Anesthesia Evaluation  Patient identified by MRN, date of birth, ID band Patient awake    Reviewed: Allergy & Precautions, NPO status   Airway Mallampati: II       Dental  (+) Upper Dentures, Lower Dentures   Pulmonary former smoker,    Pulmonary exam normal breath sounds clear to auscultation       Cardiovascular hypertension, Normal cardiovascular exam Rhythm:Regular Rate:Normal     Neuro/Psych PSYCHIATRIC DISORDERS Anxiety    GI/Hepatic GERD  Medicated,  Endo/Other  Hypothyroidism   Renal/GU      Musculoskeletal   Abdominal Normal abdominal exam  (+)   Peds  Hematology negative hematology ROS (+)   Anesthesia Other Findings [name]  GATED SPECT [facility] PERF [facility] STRESS 1D  Order# [id number]  Reading physician: Skeet Latch, MD Ordering physician: Lonn Georgia, PA-C Study date: 01/21/15 Patient Information   Name MRN Description [name] [id number] 84 y.o. Female Result Notes for Myocardial Perfusion Imaging   Notes Recorded by Fidel Levy, RN on 01/29/2015 at 3:13 PM Spoke with [name], daughter, DPR on file. Communicated results. ------  Notes Recorded by Lonn Georgia, PA-C on 01/29/2015 at 3:06 PM Please let her know the study is OK.   Vitals   Height Weight BMI (Calculated) 5\' 4"  (1.626 m) 145 lb (65.8 kg) 24.9 Study Highlights    Nuclear stress EF: 56%.  The left ventricular ejection fraction is normal (55-65%).  There was no ST segment deviation noted during stress.  No T wave inversion was noted during stress.  Defect 1: There is a small defect of mild severity present in the apical anterior location that is likely due to breast attenuation, but cannot rule out a small area of mild ischemia.  The study is normal.  This is a low risk study.   Nuclear History and Indications   History and Indications Indication for Stress Test: Evaluation of extent and  severity of coronary artery disease History: CAD;MI-2003;PTCA w/ISR 99%; Last cath 10/2013; Last Nuc MPI 12/27/11-nml; EF=81%; [place]- 07/20/04;AAA-iliac; Chronic Heart Failure Cardiac Risk Factors: Carotid Disease, History of Smoking, Hypertension, Lipids and PVD  Symptoms: Chest Pain, DOE, Fatigue, Light-Headedness and Palpitations Stress Findings   ECG Baseline ECG exhibits normal sinus rhythm and with PVCs.. Stress Findings A pharmacological stress test was performed using IV Lexiscan 0.4mg  over 10 seconds performed without concurrent submaximal exercise.  The patient reported nausea, shortness of breath and headache during the stress test. 100mg  dose of IV aminophylline given for symptom relief approximately 2 minutes after stress.  Test was stopped per protocol. Response to Stress There was no ST segment deviation noted during stress.  No T wave inversion was noted during stress. Arrhythmias during stress: rare PVCs.  Arrhythmias during recovery: rare PVCs.  Arrhythmias were not significant.  ECG was interpretable and there was no significant change from baseline. Stress Measurements    Reproductive/Obstetrics                            Anesthesia Physical Anesthesia Plan  ASA: III  Anesthesia Plan: General   Post-op Pain Management:    Induction: Intravenous  PONV Risk Score and Plan: 3 and Ondansetron, Dexamethasone and Treatment may vary due to age or medical condition  Airway Management Planned: LMA  Additional Equipment:   Intra-op Plan:   Post-operative Plan:   Informed Consent: I have reviewed the patients History and Physical, chart, labs and  discussed the procedure including the risks, benefits and alternatives for the proposed anesthesia with the patient or authorized representative who has indicated his/her understanding and acceptance.   Dental advisory given  Plan Discussed with:   Anesthesia Plan Comments:          Anesthesia Quick Evaluation

## 2017-08-23 NOTE — Anesthesia Postprocedure Evaluation (Signed)
Anesthesia Post Note  Patient: [name]  Procedure(s) Performed: BREAST LUMPECTOMY WITH RADIOACTIVE SEED LOCALIZATION (Left Breast)     Patient location during evaluation: PACU Anesthesia Type: General Level of consciousness: awake and sedated Pain management: pain level controlled Vital Signs Assessment: post-procedure vital signs reviewed and stable Respiratory status: spontaneous breathing Cardiovascular status: stable Postop Assessment: no apparent nausea or vomiting Anesthetic complications: no    Last Vitals:  Vitals:   08/23/17 1251 08/23/17 1304  BP:  (!) 149/63  Pulse: (!) 56 (!) 58  Resp: 14 11  Temp:    SpO2: 97% 93%    Last Pain:  Vitals:   08/23/17 1250  TempSrc:   PainSc: 4    Pain Goal: Patients Stated Pain Goal: 5 (08/23/17 0934)               Karene Bracken JR,JOHN Mateo Flow

## 2017-08-23 NOTE — Interval H&P Note (Signed)
History and Physical Interval Note:  08/23/2017 10:36 AM  [name]  has presented today for surgery, with the diagnosis of LEFT BREAST [name]  The various methods of treatment have been discussed with the patient and family. After consideration of risks, benefits and other options for treatment, the patient has consented to  Procedure(s): BREAST LUMPECTOMY WITH RADIOACTIVE SEED LOCALIZATION (Left) as a surgical intervention .  The patient's history has been reviewed, patient examined, no change in status, stable for surgery.  I have reviewed the patient's chart and labs.  Questions were answered to the patient's satisfaction.     [place]

## 2017-08-23 NOTE — Anesthesia Procedure Notes (Signed)
Procedure Name: LMA Insertion Date/Time: 08/23/2017 11:04 AM Performed by: Gwyndolyn Saxon, CRNA Pre-anesthesia Checklist: Patient identified, Emergency Drugs available, Suction available and Patient being monitored Patient Re-evaluated:Patient Re-evaluated prior to induction Oxygen Delivery Method: Circle System Utilized Preoxygenation: Pre-oxygenation with 100% oxygen Induction Type: IV induction Ventilation: Mask ventilation without difficulty LMA: LMA inserted LMA Size: 4.0 Number of attempts: 1 Airway Equipment and Method: Bite block Placement Confirmation: positive ETCO2 Tube secured with: Tape Dental Injury: Teeth and Oropharynx as per pre-operative assessment  Comments: Inserted by Mortimer Fries SRNA

## 2017-08-23 NOTE — Op Note (Signed)
Preoperative diagnosis: Stage II left breast cancer  Postoperative diagnosis: Same  Procedure: Left breast seed localized partial mastectomy  Surgeon: Erroll Luna, MD  Anesthesia: General with local  EBL: 10 cc  Specimen: Left breast mass with localizing seating clip verified by Faxitron with grossly negative margins  Drains: None  IV fluids: Per anesthesia record  Indications for procedure: The patient is a 84 year old female who was found to have a left breast mass and core biopsy showed invasive ductal carcinoma.  She opted for left breast lumpectomy only.Preoperative diagnosis: Sigmoid  Description of procedure: The patient was met in the holding area.  Left l breast was marked as the correct side.  Neoprobe was used to verify seed location.  She was taken back to the operating room.  She was placed supine upon the operating room table.  After induction of general anesthesia left breast was prepped and draped in sterile fashion.  Timeout was done.  Neoprobe used to mass localized left breast lateral upper outer quadrant.  Curvilinear incision was made over this after infiltration of local anesthetic.  All tissue around the seed and clip were excised to grossly negative margins.  Faxitron image was used which showed both seed and clip in the specimen.  This was sent to pathology review by radiology.  The cavity was irrigated and closed after assuring hemostasis with 3-0 Vicryl and 4-0 Monocryl.  The cavity was clipped prior to closure.  Dermabond applied.  All final counts found to be correct.  Breast binder placed.  The patient was awoke extubated taken to recovery in satisfactory condition.

## 2017-08-23 NOTE — Transfer of Care (Signed)
Immediate Anesthesia Transfer of Care Note  Patient: [name]  Procedure(s) Performed: BREAST LUMPECTOMY WITH RADIOACTIVE SEED LOCALIZATION (Left Breast)  Patient Location: PACU  Anesthesia Type:General  Level of Consciousness: awake, alert  and oriented  Airway & Oxygen Therapy: Patient Spontanous Breathing and Patient connected to face mask oxygen  Post-op Assessment: Report given to RN and Post -op Vital signs reviewed and stable  Post vital signs: Reviewed and stable  Last Vitals:  Vitals Value Taken Time  BP 165/64 08/23/2017 11:50 AM  Temp    Pulse 65 08/23/2017 11:51 AM  Resp 9 08/23/2017 11:51 AM  SpO2 97 % 08/23/2017 11:51 AM  Vitals shown include unvalidated device data.  Last Pain:  Vitals:   08/23/17 0934  TempSrc:   PainSc: 0-No pain      Patients Stated Pain Goal: 5 (59/93/57 0177)  Complications: No apparent anesthesia complications

## 2017-08-23 NOTE — Discharge Instructions (Signed)
[facility] °Office Phone Number [phone] ° °BREAST BIOPSY/ PARTIAL MASTECTOMY: POST OP INSTRUCTIONS ° °Always review your discharge instruction sheet given to you by the facility where your surgery was performed. ° °IF YOU HAVE DISABILITY OR FAMILY LEAVE FORMS, YOU MUST BRING THEM TO THE OFFICE FOR PROCESSING.  DO NOT GIVE THEM TO YOUR DOCTOR. ° °1. A prescription for pain medication may be given to you upon discharge.  Take your pain medication as prescribed, if needed.  If narcotic pain medicine is not needed, then you may take acetaminophen (Tylenol) or ibuprofen (Advil) as needed. °2. Take your usually prescribed medications unless otherwise directed °3. If you need a refill on your pain medication, please contact your pharmacy.  They will contact our office to request authorization.  Prescriptions will not be filled after 5pm or on week-ends. °4. You should eat very light the first 24 hours after surgery, such as soup, crackers, pudding, etc.  Resume your normal diet the day after surgery. °5. Most patients will experience some swelling and bruising in the breast.  Ice packs and a good support bra will help.  Swelling and bruising can take several days to resolve.  °6. It is common to experience some constipation if taking pain medication after surgery.  Increasing fluid intake and taking a stool softener will usually help or prevent this problem from occurring.  A mild laxative (Milk of Magnesia or Miralax) should be taken according to package directions if there are no bowel movements after 48 hours. °7. Unless discharge instructions indicate otherwise, you may remove your bandages 24-48 hours after surgery, and you may shower at that time.  You may have steri-strips (small skin tapes) in place directly over the incision.  These strips should be left on the skin for 7-10 days.  If your surgeon used skin glue on the incision, you may shower in 24 hours.  The glue will flake off over the  next 2-3 weeks.  Any sutures or staples will be removed at the office during your follow-up visit. °8. ACTIVITIES:  You may resume regular daily activities (gradually increasing) beginning the next day.  Wearing a good support bra or sports bra minimizes pain and swelling.  You may have sexual intercourse when it is comfortable. °a. You may drive when you no longer are taking prescription pain medication, you can comfortably wear a seatbelt, and you can safely maneuver your car and apply brakes. °b. RETURN TO WORK:  ______________________________________________________________________________________ °9. You should see your doctor in the office for a follow-up appointment approximately two weeks after your surgery.  Your doctor’s nurse will typically make your follow-up appointment when she calls you with your pathology report.  Expect your pathology report 2-3 business days after your surgery.  You may call to check if you do not hear from us after three days. °10. OTHER INSTRUCTIONS: _______________________________________________________________________________________________ _____________________________________________________________________________________________________________________________________ °_____________________________________________________________________________________________________________________________________ °_____________________________________________________________________________________________________________________________________ ° °WHEN TO CALL YOUR DOCTOR: °1. Fever over 101.0 °2. Nausea and/or vomiting. °3. Extreme swelling or bruising. °4. Continued bleeding from incision. °5. Increased pain, redness, or drainage from the incision. ° °The clinic staff is available to answer your questions during regular business hours.  Please don’t hesitate to call and ask to speak to one of the nurses for clinical concerns.  If you have a medical emergency, go to the nearest  emergency room or call 911.  A surgeon from [facility] is always on call at the hospital. ° °For further questions, please visit [email or web address]  °

## 2017-08-23 NOTE — H&P (Signed)
[name] Location: [facility] Patient #: [id number] DOB: [birth year] Undefined / Language: Cleophus Molt / Race: White Female  History of Present Illness  Patient words: Pt sent at the request of Dr Lisbeth Renshaw for a left breast mass picked up on mammogram and felt by the patient a couple of months ago. Location is left central breast. It is not painful. No nipple discharge. No redness or drainage. Mammogram show 2 cm mass core bx IDC grade 1 ER/PR pos her 2 neu negative.  She denies CP SOB.  The patient is a 84 year old female.   Past Surgical History Appendectomy Breast Biopsy Left. Cataract Surgery Bilateral. Gallbladder Surgery - Laparoscopic Oral Surgery  Diagnostic Studies History  Colonoscopy 5-10 years ago Mammogram within last year Pap Smear >5 years ago  Medication History Medications Reconciled  Social History  Caffeine use Coffee. No alcohol use No drug use Tobacco use Former smoker.  Family History  Arthritis Family Members In General, Father, Mother. Cancer Brother. Diabetes Mellitus Family Members In [place], Son. Heart Disease Father, Mother. Hypertension Family Members In General, Father, Mother. Prostate Cancer Brother. Respiratory Condition Father. Thyroid problems Sister.  Pregnancy / Birth History Tawni Pummel, RN; 07/20/2017 7:31 AM) Age at menarche 41 years. Age of menopause 51-55 Contraceptive History Oral contraceptives. Gravida 7 Irregular periods Length (months) of breastfeeding 7-12 Maternal age 71-20 Para 4  Other Problems) Anxiety Disorder Back Pain Breast Cancer Chest pain Cholelithiasis Depression Diverticulosis Gastric Ulcer Gastroesophageal Reflux Disease High blood pressure Hypercholesterolemia Kidney Stone Lump In Breast Migraine Headache Myocardial infarction Thyroid Disease     Review of Systems  General Present- Fatigue. Not Present-  Appetite Loss, Chills, Fever, Night Sweats, Weight Gain and Weight Loss. Skin Present- Dryness. Not Present- Change in Wart/Mole, Hives, Jaundice, New Lesions, Non-Healing Wounds, Rash and Ulcer. HEENT Present- Hearing Loss. Not Present- Earache, Hoarseness, Nose Bleed, Oral Ulcers, Ringing in the Ears, Seasonal Allergies, Sinus Pain, Sore Throat, Visual Disturbances, Wears glasses/contact lenses and Yellow Eyes. Respiratory Present- Chronic Cough, Difficulty Breathing and Wheezing. Not Present- Bloody sputum and Snoring. Breast Present- Breast Mass. Not Present- Breast Pain, Nipple Discharge and Skin Changes. Cardiovascular Present- Palpitations and Shortness of Breath. Not Present- Chest Pain, Difficulty Breathing Lying Down, Leg Cramps, Rapid Heart Rate and Swelling of Extremities. Gastrointestinal Present- Bloating, Chronic diarrhea and Difficulty Swallowing. Not Present- Abdominal Pain, Bloody Stool, Change in Bowel Habits, Constipation, Excessive gas, Gets full quickly at meals, Hemorrhoids, Indigestion, Nausea, Rectal Pain and Vomiting. Female Genitourinary Not Present- Frequency, Nocturia, Painful Urination, Pelvic Pain and Urgency. Musculoskeletal Present- Back Pain. Not Present- Joint Pain, Joint Stiffness, Muscle Pain, Muscle Weakness and Swelling of Extremities. Neurological Present- Trouble walking and Weakness. Not Present- Decreased Memory, Fainting, Headaches, Numbness, Seizures, Tingling and Tremor. Psychiatric Present- Depression. Not Present- Anxiety, Bipolar, Change in Sleep Pattern, Fearful and Frequent crying. Endocrine Present- Hair Changes and Heat Intolerance. Not Present- Cold Intolerance, Excessive Hunger, Hot flashes and New Diabetes. Hematology Present- Easy Bruising. Not Present- Blood Thinners, Excessive bleeding, Gland problems, HIV and Persistent Infections. All other systems negative   Physical Exam  General Mental Status-Alert. General  Appearance-Consistent with stated age. Hydration-Well hydrated. Voice-Normal.  Head and Neck Head-normocephalic, atraumatic with no lesions or palpable masses. Trachea-midline.  Eye Eyeball - Bilateral-Extraocular movements intact. Sclera/Conjunctiva - Bilateral-No scleral icterus.  Chest and Lung Exam Chest and lung exam reveals -quiet, even and easy respiratory effort with no use of accessory muscles and on auscultation, normal breath sounds, no adventitious sounds  and normal vocal resonance. Inspection Chest Wall - Normal. Back - normal.  Breast Note: left central breast mass 2 cm below nipple but no retraction. right breast normal  Neurologic Neurologic evaluation reveals -alert and oriented x 3 with no impairment of recent or remote memory. Mental Status-Normal.  Musculoskeletal Normal Exam - Left-Upper Extremity Strength Normal and Lower Extremity Strength Normal. Normal Exam - Right-Upper Extremity Strength Normal, Lower Extremity Weakness.    Assessment & Plan  BREAST CANCER, LEFT (C50.912) Impression: recommend left seed lumpectomy She does not need SLN mapping and discussed with her and her daughter  Risk of lumpectomy include bleeding, infection, seroma, more surgery, use of seed/wire, wound care, cosmetic deformity and the need for other treatments, death , blood clots, death. Pt agrees to proceed.  Current Plans You are being scheduled for surgery- Our schedulers will call you.  You should hear from our office's scheduling department within 5 working days about the location, date, and time of surgery. We try to make accommodations for patient's preferences in scheduling surgery, but sometimes the OR schedule or the surgeon's schedule prevents [place] from making those accommodations.  If you have not heard from our office [phone]) in 5 working days, call the office and ask for your surgeon's nurse.  If you have  other questions about your diagnosis, plan, or surgery, call the office and ask for your surgeon's nurse.  Pt Education - Pamphlet Given - Breast Biopsy: discussed with patient and provided information. We discussed the staging and pathophysiology of breast cancer. We discussed all of the different options for treatment for breast cancer including surgery, chemotherapy, radiation therapy, Herceptin, and antiestrogen therapy. We discussed a sentinel lymph node biopsy as she does not appear to having lymph node involvement right now. We discussed the performance of that with injection of radioactive tracer and blue dye. We discussed that she would have an incision underneath her axillary hairline. We discussed that there is a bout a 10-20% chance of having a positive node with a sentinel lymph node biopsy and we will await the permanent pathology to make any other first further decisions in terms of her treatment. One of these options might be to return to the operating room to perform an axillary lymph node dissection. We discussed about a 1-2% risk lifetime of chronic shoulder pain as well as lymphedema associated with a sentinel lymph node biopsy. We discussed the options for treatment of the breast cancer which included lumpectomy versus a mastectomy. We discussed the performance of the lumpectomy with a wire placement. We discussed a 10-20% chance of a positive margin requiring reexcision in the operating room. We also discussed that she may need radiation therapy or antiestrogen therapy or both if she undergoes lumpectomy. We discussed the mastectomy and the postoperative care for that as well. We discussed that there is no difference in her survival whether she undergoes lumpectomy with radiation therapy or antiestrogen therapy versus a mastectomy. There is a slight difference in the local recurrence rate being 3-5% with lumpectomy and about 1% with a mastectomy. We discussed the risks of operation  including bleeding, infection, possible reoperation. She understands her further therapy will be based on what her stages at the time of her operation.  Pt Education - flb breast cancer surgery: discussed with patient and provided information. Pt Education - [facility] Breast Biopsy HCI: discussed with patient and provided information. Pt Education - ABC (After Breast Cancer) Class Info: discussed with patient and provided information.

## 2017-08-24 ENCOUNTER — Encounter (HOSPITAL_COMMUNITY): Payer: Self-pay | Admitting: Surgery

## 2017-08-29 ENCOUNTER — Other Ambulatory Visit: Payer: Self-pay | Admitting: Oncology

## 2017-08-30 NOTE — Progress Notes (Signed)
[place]  [phone] [phone]     ID: [name] DOB: October 22, 1929  MR#: [id number]  DXI#:[id number]  Patient Care Team: Drake Leach, MD as PCP - General (Internal Medicine) Leonie Man, MD as PCP - Cardiology (Cardiology) Levorn Oleski, Virgie Dad, MD as Consulting Physician (Oncology) Erroll Luna, MD as Consulting Physician (General Surgery) Kyung Rudd, MD as Consulting Physician (Radiation Oncology) Leonie Man, MD as Consulting Physician (Cardiology) Juanita Craver, MD as Consulting Physician (Gastroenterology) OTHER MD:  CHIEF COMPLAINT: Estrogen receptor positive breast [name]  CURRENT TREATMENT: Anastrozole; adjuvant radiation pending   HISTORY OF CURRENT ILLNESS: From the original intake note:  [name] had an annual wellness check up on 06/22/2017. Her PCP palpated a possible mass in the left breast and recommended that she have a diagnostic mammogram. She underwent bilateral diagnostic mammography with tomography and left breast ultrasonography at [facility] on 07/05/2017 showing: breast density category C. There is an irregular asymmetry in the left breast the 5 o'clock position lower outer quadrant. Sonographically, there was a mass in the 2 o'clock upper outer position anterior depth of the left breast measuring 2 cm. No abnormalities were found in the left axilla.   Accordingly on 07/06/2017 she proceeded to biopsy of the left breast area in question. The pathology from this procedure showed ([id number]): Invasive ductal carcinoma, grade II.  Also ductal carcinoma in situ. Prognostic indicators significant for: estrogen receptor, 100% positive and progesterone receptor, 100% positive, both with strong staining intensity. Proliferation marker Ki67 at 12%. HER2 not amplified with ratios HER2/CEP17 signals 1.37 and average HER2 copies per cell 1.85  The patient's subsequent history is as detailed below.  INTERVAL  HISTORY: [name] returns today for follow up and treatment of her estrogen receptor positive breast [name] accompanied by her daughter. Since her last visit, she underwent left lumpectomy ([id number]) on 08/23/2017 showing: Invasive ductal carcinoma grade III, spanning 2.1 cm. High grade DCIS. Carcinoma focally involves the anterior margin and is less than 1 mm from the lateral margin. Carcinoma is 4 mm from the superior margin. DCIS is less than 1 mm from the anterior margin and is 1 mm from the lateral margin.    REVIEW OF SYSTEMS: [name] reports that she has some pain from her surgery, but it is not severe enough to take pain medication. It does not limit her daily activities. She did not have any other complications from surgery.  She denies unusual headaches, visual changes, nausea, vomiting, or dizziness. There has been no unusual cough, phlegm production, or pleurisy. This been no change in bowel or bladder habits. She denies unexplained fatigue or unexplained weight loss, bleeding, rash, or fever. A detailed review of systems was otherwise stable.     PAST MEDICAL HISTORY: Past Medical History:  Diagnosis Date  . Abdominal aortic aneurysm ([place])    asymptomatic, 2012 -  2.6 x 2.4cm  . Anxiety   . CAD S/P percutaneous coronary angioplasty    Cath 2010: 100% occlusion of RCA  (2 BMS stents finally occluded) w/ left to right collaterals; moderate LAD and circumflex disease.  EF 55-60%;; cath 2015 with medical therapy recommended   . Chronic lower back pain   . Daily headache       . Depression   . Dyspnea    on exertion sometimes  . Exertional shortness of breath   . GERD (gastroesophageal reflux disease)   . History of kidney stones   . History of stomach ulcers   .  Hyperlipidemia   . Hypertension   . Hypothyroidism   . Macular degeneration    "started out dry; now is wet" (01/08/2013)  . Migraines       . Myocardial infarction ([place]) 2003  . PAD (peripheral artery disease) ([place])  12/2005   s/p L Common Iliac Stent  . PONV (postoperative nausea and vomiting)   . Walking pneumonia 1970's  Legally blind  PAST SURGICAL HISTORY: Past Surgical History:  Procedure Laterality Date  . ABD AOTRA DOPPLER  08/14/2012   distal mild dilatation 2.6 x 2.5cm unchanged;right cia 70-99%  new finding  . Abdominal Aorta Duplex  09/2014   No change from prior study; 2.6 x 2.4 cm mild AAA. R Com Iliac A  > 50%. -- f/u 24 months.  . ABDOMINAL AORTAGRAM  01/08/2013   Procedure: ABDOMINAL Maxcine Ham;  Surgeon: Lorretta Harp, MD;  Location[facility] C[facility] LAB;  Service: Cardiovascular;;  . ANGIOPLASTY Right 01/08/2013   unsucess stenting RLE   . APPENDECTOMY    . BREAST LUMPECTOMY WITH RADIOACTIVE SEED LOCALIZATION Left 08/23/2017   Procedure: BREAST LUMPECTOMY WITH RADIOACTIVE SEED LOCALIZATION;  Surgeon: Erroll Luna, MD;  Location: [facility];  Service: General;  Laterality: Left;  . CARDIAC CATHETERIZATION  11/2008   diffuse ISR in  RCA w/ good L-R collaterals; AV groove Cx - 60-70% stenosis with post stenosis ectasia;  EF 55%, mild  CAD in LAD -- Med Rx  . CATARACT EXTRACTION W/ INTRAOCULAR LENS  IMPLANT, BILATERAL Bilateral   . CHOLECYSTECTOMY    . CORONARY ANGIOPLASTY  April 2006   Cutting Balloon PTCA of RCA ISR  . CORONARY ANGIOPLASTY WITH STENT PLACEMENT  Jan 2004   RCA - 2 overlapping Pixel BMS 2.0 mm x 18 mm & 2.0 mm x 23 m   . DILATION AND CURETTAGE OF UTERUS     "couple over the years" (01/08/2013)  . ILIAC ARTERY STENT Left 12/30/2005   PTA anddirect stenting  of left common iliac artery - 8.0 x 18 mm OmniLink stent  . ILIAC ARTERY STENT Right 02/13/2013   successful diamondback orbital rotation lipectomy, PTA and stenting of highly calcified ostial common iliac artery/notes 02/13/2013  . LEFT HEART CATHETERIZATION WITH CORONARY ANGIOGRAM N/A 11/13/2013   Procedure: LEFT HEART CATHETERIZATION WITH CORONARY ANGIOGRAM;  Surgeon: Leonie Man, MD; LAD 40%, CFX 60%, OM1 60%, RCA  diffuse in-stent restenosis up to 99%, small PDA and PLA, collaterals from left system noted, EF 55-60 percent  . LOWER EXTREMITY ANGIOGRAM N/A 01/08/2013   Procedure: LOWER EXTREMITY ANGIOGRAM;  Surgeon: Lorretta Harp, MD;  Location: [facility] CATH LAB;  Service: Cardiovascular;  Laterality: N/A;  . NM MYOCAR PERF WALL MOTION  12/27/2011 -[place]   LV normal,EF 81 %  . NM MYOCAR PERF WALL MOTION  01/2015   EF 56%. Normal ROM of low risk. Small defect of mild severity and apical anterior location likely related to breast attenuation. Cannot rule out small area of mild ischemia.  . TRANSTHORACIC ECHOCARDIOGRAM  10/2013   Normal LV size low normal function. EF 50-55%. No regional wall motion abnormality. GR 1 DD. PAP ~ 47 mmHg    FAMILY HISTORY No family history on file.  The patient's father died at age 97 due to diabetes and heart issues. The patient's mother died at age 71 due to old age. The patient has 6 brothers and 2 sisters. She denies a family history of breast or ovarian [name].   GYNECOLOGIC HISTORY:  No LMP  recorded. Patient is postmenopausal. Menarche: 84 years old Age at first live birth: 84 years old She is GXP4.  Her LMP was at age 17.  She never used contraceptives. 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 She is widowed and lives by herself, with no pets. Daughter, [name], lives in [place] and is retired. Son, Juanda Crumble, lives in [place] and is a Administrator. Son, Coralyn Mark, lives in [place] and is a Freight forwarder. The patient's son, Cecilie Lowers died only a few months ago from a sudden intracranial bleed. The patient has 6 grandchildren and 1 great-grandchild. She attends [employer] in [place].     ADVANCED DIRECTIVES: Suzi Roots (her daughter) is healthcare power of attorney   HEALTH MAINTENANCE: Social History   Tobacco Use  . Smoking status: Former Smoker    Packs/day: 1.00    Years:  40.00    Pack years: 40.00    Types: Cigarettes    Last attempt to quit: 09/18/1987    Years since quitting: 29.9  . Smokeless tobacco: Never Used  Substance Use Topics  . Alcohol use: No  . Drug use: No     Colonoscopy: 2009 under Dr. Collene Mares  PAP: 10+ years ago  Bone density: 2002 ostepenia   Allergies  Allergen Reactions  . Avastin [Bevacizumab] Other (See Comments)    Muscle pain  . Benadryl [Diphenhydramine] Palpitations and Other (See Comments)    TACHYCARDIA  . Hydrocodone-Acetaminophen Nausea And Vomiting and Other (See Comments)    Delirium  . Levofloxacin Other (See Comments)    Myalgias  . Statins Other (See Comments)    Myalgias  . Trazodone Other (See Comments)    UNSPECIFIED REACTION "TERRIBLE SIDE EFFECTS"  . Beta Adrenergic Blockers Other (See Comments)    fatigue    Current Outpatient Medications  Medication Sig Dispense Refill  . ALPRAZolam (XANAX) 0.5 MG tablet Take 0.5 mg by mouth daily as needed for anxiety.    [name] amLODipine (NORVASC) 2.5 MG tablet Take 1 tablet (2.5 mg total) by mouth daily. KEEP OV. (Patient taking differently: Take 2.5 mg by mouth every evening. KEEP OV.) 90 tablet 0  . Cholecalciferol (VITAMIN D-3) 5000 units TABS Take 5,000 Units by mouth daily.    [name] HYDROcodone-acetaminophen (NORCO/VICODIN) 5-325 MG tablet Take 1 tablet by mouth every 6 (six) hours as needed for moderate pain. 10 tablet 0  . levothyroxine (SYNTHROID, LEVOTHROID) 100 MCG tablet Take 100 mcg by mouth daily.    . nitroGLYCERIN (NITROSTAT) 0.4 MG SL tablet Place 1 tablet (0.4 mg total) under the tongue every 5 (five) minutes as needed for chest pain. 25 tablet 4  . Omega-3 Fatty Acids (FISH OIL) 1000 MG CAPS Take 1,000 mg by mouth daily.     [name] omeprazole (PRILOSEC) 40 MG capsule Take 40 mg by mouth daily as needed for heartburn or indigestion.    Vladimir Faster Glycol-Propyl Glycol (LUBRICANT EYE DROPS) 0.4-0.3 % SOLN Place 1 drop into both eyes daily.    . ranitidine  (ZANTAC) 150 MG tablet Take 150 mg by mouth 2 (two) times daily as needed (FOR HEARTBURN/INDIGESTION/ACID REFLUX.).     [name] sertraline (ZOLOFT) 100 MG tablet Take 100 mg by mouth every evening.    . trandolapril (MAVIK) 2 MG tablet TAKE 1 TABLET EVERY DAY 60 tablet 0   No current facility-administered medications for this visit.     OBJECTIVE:  Older white woman who appears stated age  43:   09/02/17 1113  BP: (!) 185/78  Pulse: 79  Resp: 18  Temp: 97.8 F (36.6 C)  SpO2: 97%     Body mass index is 24.98 kg/m.   Wt Readings from Last 3 Encounters:  09/02/17 147 lb 12.8 oz (67 kg)  08/23/17 147 lb (66.7 kg)  08/10/17 147 lb (66.7 kg)      ECOG FS:1 - Symptomatic but completely ambulatory  Sclerae unicteric, EOMs intact Oropharynx clear and moist No cervical or supraclavicular adenopathy Lungs no rales or rhonchi Heart regular rate and rhythm Abd soft, nontender, positive bowel sounds MSK no focal spinal tenderness, no upper extremity lymphedema Neuro: nonfocal, well oriented, appropriate affect Breasts: The incision in the left breast is healing nicely, with no dehiscence, erythema, or swelling.  The cosmetic result is excellent.   LAB RESULTS:  CMP     Component Value Date/Time   NA 138 08/10/2017 1021   K 4.3 08/10/2017 1021   CL 106 08/10/2017 1021   CO2 24 08/10/2017 1021   GLUCOSE 90 08/10/2017 1021   BUN 11 08/10/2017 1021   CREATININE 0.72 08/10/2017 1021   CREATININE 0.73 11/07/2013 1346   CALCIUM 9.3 08/10/2017 1021   PROT 6.3 (L) 04/02/2016 2042   ALBUMIN 3.6 04/02/2016 2042   AST 27 04/02/2016 2042   ALT 21 04/02/2016 2042   ALKPHOS 63 04/02/2016 2042   BILITOT 0.3 04/02/2016 2042   GFRNONAA >60 08/10/2017 1021   GFRAA >60 08/10/2017 1021    No results found for: TOTALPROTELP, ALBUMINELP, A1GS, A2GS, BETS, BETA2SER, GAMS, MSPIKE, SPEI  No results found for: KPAFRELGTCHN, LAMBDASER, KAPLAMBRATIO  Lab Results  Component Value Date   WBC 6.0  08/10/2017   NEUTROABS 3.5 11/26/2008   HGB 13.3 08/10/2017   HCT 40.8 08/10/2017   MCV 99.0 08/10/2017   PLT 192 08/10/2017    @LASTCHEMISTRY @  No results found for: LABCA2  No components found for: ONGEXB284  No results for input(s): INR in the last 168 hours.  No results found for: LABCA2  No results found for: XLK440  No results found for: NUU725  No results found for: DGU440  No results found for: CA2729  No components found for: HGQUANT  No results found for: CEA1 / No results found for: CEA1   No results found for: AFPTUMOR  No results found for: CHROMOGRNA  No results found for: PSA1  No visits with results within 3 Day(s) from this visit.  Latest known visit with results is:  Hospital Outpatient Visit on 08/10/2017  Component Date Value Ref Range Status  . Sodium 08/10/2017 138  135 - 145 mmol/L Final  . Potassium 08/10/2017 4.3  3.5 - 5.1 mmol/L Final  . Chloride 08/10/2017 106  101 - 111 mmol/L Final  . CO2 08/10/2017 24  22 - 32 mmol/L Final  . Glucose, Bld 08/10/2017 90  65 - 99 mg/dL Final  . BUN 08/10/2017 11  6 - 20 mg/dL Final  . Creatinine, Ser 08/10/2017 0.72  0.44 - 1.00 mg/dL Final  . Calcium 08/10/2017 9.3  8.9 - 10.3 mg/dL Final  . GFR calc non Af Amer 08/10/2017 >60  >60 mL/min Final  . GFR calc Af Amer 08/10/2017 >60  >60 mL/min Final   Comment: (NOTE) The eGFR has been calculated using the CKD EPI equation. This calculation has not been validated in all clinical situations. eGFR's persistently <60 mL/min signify possible Chronic Kidney Disease.   [name]  Anion gap 08/10/2017 8  5 - 15 Final   Performed at [facility] [address] [place], [place] [address]  . WBC 08/10/2017 6.0  4.0 - 10.5 K/uL Final  . RBC 08/10/2017 4.12  3.87 - 5.11 MIL/uL Final  . Hemoglobin 08/10/2017 13.3  12.0 - 15.0 g/dL Final  . HCT 08/10/2017 40.8  36.0 - 46.0 % Final  . MCV 08/10/2017 99.0  78.0 - 100.0 fL Final  . MCH 08/10/2017 32.3  26.0 -  34.0 pg Final  . MCHC 08/10/2017 32.6  30.0 - 36.0 g/dL Final  . RDW 08/10/2017 12.4  11.5 - 15.5 % Final  . Platelets 08/10/2017 192  150 - 400 K/uL Final   Performed at [facility], [place] [address], [place], [place] [address]    (this displays the last labs from the last 3 days)  No results found for: TOTALPROTELP, ALBUMINELP, A1GS, A2GS, BETS, BETA2SER, GAMS, MSPIKE, SPEI (this displays SPEP labs)  No results found for: KPAFRELGTCHN, LAMBDASER, KAPLAMBRATIO (kappa/lambda light chains)  No results found for: HGBA, HGBA2QUANT, HGBFQUANT, HGBSQUAN (Hemoglobinopathy evaluation)   No results found for: LDH  No results found for: IRON, TIBC, IRONPCTSAT (Iron and TIBC)  No results found for: FERRITIN  Urinalysis    Component Value Date/Time   COLORURINE YELLOW 04/02/2016 2136   APPEARANCEUR HAZY (A) 04/02/2016 2136   LABSPEC 1.018 04/02/2016 2136   PHURINE 5.0 04/02/2016 2136   GLUCOSEU NEGATIVE 04/02/2016 2136   HGBUR NEGATIVE 04/02/2016 2136   BILIRUBINUR NEGATIVE 04/02/2016 2136   KETONESUR NEGATIVE 04/02/2016 2136   PROTEINUR NEGATIVE 04/02/2016 2136   NITRITE NEGATIVE 04/02/2016 2136   LEUKOCYTESUR LARGE (A) 04/02/2016 2136     STUDIES: Pathology reviewed with the patient and daughter  ELIGIBLE FOR AVAILABLE RESEARCH PROTOCOL: Exact sciences study  ASSESSMENT: 84 y.o. [place], [place] woman status post left breast upper outer quadrant biopsy 07/06/2017 for a clinical T1c N0, stage IA invasive ductal carcinoma, grade 2, estrogen and progesterone receptor positive, HER-2 not amplified, with an MIB-1 of 12%  (1) status post left lumpectomy without sentinel lymph node sampling 08/23/2017 for a pT2 pNX, stage IIA invasive ductal carcinoma, grade 3, with a positive anterior margin (skin), and multiple close margins  (2) adjuvant radiation pending  (3) anastrozole started 09/02/2017.  PLAN:  Shaquina did generally very well with her surgery.  We went over the pathology  results in detail.  They understand there are multiple close margins and that this is a concern.  Whereas originally we were hoping the patient could forego radiation, at this point I think radiation is in her interest.  The patient had originally been scheduled to see Dr. Lisbeth Renshaw but she actually never did see him.  They would like to see Dr. Isidore Moos because a good friend of hers has Dr. Isidore Moos as a physician.  I am trying to get an appointment for the patient with Dr. Isidore Moos the first week in June which is when the daughter will be back in town and unable to participate in the decision-making.  We are going to present the case this coming week at the multidisciplinary conference to make sure that radiation would be adequate and that they do not do have to go back to improve the surgical margins  At this think I think it would be prudent to start anastrozole port we discussed the possible toxicity side effects and complications of this agent and I went ahead and wrote her the prescription.  I am also setting her up for a bone density at [facility] to be done within the next month.    Reya Aurich, Virgie Dad, MD  09/02/17 11:37 AM Medical Oncology and Hematology [facility] [address] [place], [place] [address] Tel. [phone]    Fax. [phone]  This document serves as a record of services personally performed by Lurline Del, MD. It was created on his behalf by Sheron Nightingale, a trained medical scribe. The creation of this record is based on the scribe's personal observations and the provider's statements to them.   I have reviewed the above documentation for accuracy and completeness, and I agree with the above.

## 2017-09-02 ENCOUNTER — Telehealth: Payer: Self-pay | Admitting: Oncology

## 2017-09-02 ENCOUNTER — Inpatient Hospital Stay: Payer: Medicare HMO | Attending: Oncology | Admitting: Oncology

## 2017-09-02 VITALS — BP 185/78 | HR 79 | Temp 97.8°F | Resp 18 | Ht 64.5 in | Wt 147.8 lb

## 2017-09-02 DIAGNOSIS — F329 Major depressive disorder, single episode, unspecified: Secondary | ICD-10-CM | POA: Insufficient documentation

## 2017-09-02 DIAGNOSIS — Z170 Estrogen receptor positive status [ER+]: Secondary | ICD-10-CM | POA: Diagnosis not present

## 2017-09-02 DIAGNOSIS — Z87442 Personal history of urinary calculi: Secondary | ICD-10-CM | POA: Insufficient documentation

## 2017-09-02 DIAGNOSIS — Z87891 Personal history of nicotine dependence: Secondary | ICD-10-CM | POA: Insufficient documentation

## 2017-09-02 DIAGNOSIS — E785 Hyperlipidemia, unspecified: Secondary | ICD-10-CM | POA: Insufficient documentation

## 2017-09-02 DIAGNOSIS — Z79811 Long term (current) use of aromatase inhibitors: Secondary | ICD-10-CM | POA: Diagnosis not present

## 2017-09-02 DIAGNOSIS — I10 Essential (primary) hypertension: Secondary | ICD-10-CM

## 2017-09-02 DIAGNOSIS — I714 Abdominal aortic aneurysm, without rupture: Secondary | ICD-10-CM | POA: Insufficient documentation

## 2017-09-02 DIAGNOSIS — F419 Anxiety disorder, unspecified: Secondary | ICD-10-CM | POA: Diagnosis not present

## 2017-09-02 DIAGNOSIS — C50412 Malignant neoplasm of upper-outer quadrant of left female breast: Secondary | ICD-10-CM | POA: Insufficient documentation

## 2017-09-02 DIAGNOSIS — I739 Peripheral vascular disease, unspecified: Secondary | ICD-10-CM | POA: Diagnosis not present

## 2017-09-02 DIAGNOSIS — K219 Gastro-esophageal reflux disease without esophagitis: Secondary | ICD-10-CM | POA: Diagnosis not present

## 2017-09-02 DIAGNOSIS — E039 Hypothyroidism, unspecified: Secondary | ICD-10-CM | POA: Diagnosis not present

## 2017-09-02 DIAGNOSIS — I252 Old myocardial infarction: Secondary | ICD-10-CM | POA: Diagnosis not present

## 2017-09-02 DIAGNOSIS — Z79899 Other long term (current) drug therapy: Secondary | ICD-10-CM | POA: Insufficient documentation

## 2017-09-02 DIAGNOSIS — I2510 Atherosclerotic heart disease of native coronary artery without angina pectoris: Secondary | ICD-10-CM | POA: Diagnosis not present

## 2017-09-02 DIAGNOSIS — M549 Dorsalgia, unspecified: Secondary | ICD-10-CM | POA: Diagnosis not present

## 2017-09-02 MED ORDER — ANASTROZOLE 1 MG PO TABS: 1.0000 mg | ORAL_TABLET | Freq: Every day | ORAL | 4 refills | Status: DC

## 2017-09-02 NOTE — Telephone Encounter (Signed)
Patient scheduled  In august due to being out of town in July.

## 2017-09-02 NOTE — Telephone Encounter (Signed)
Gave patient AVs and calendar of upcoming appointments. Patient scheduled at [facility] per 5/17 los.

## 2017-09-05 ENCOUNTER — Other Ambulatory Visit: Payer: Self-pay | Admitting: *Deleted

## 2017-09-05 ENCOUNTER — Encounter: Payer: Self-pay | Admitting: *Deleted

## 2017-09-05 DIAGNOSIS — C50412 Malignant neoplasm of upper-outer quadrant of left female breast: Secondary | ICD-10-CM

## 2017-09-05 DIAGNOSIS — Z170 Estrogen receptor positive status [ER+]: Principal | ICD-10-CM

## 2017-09-06 ENCOUNTER — Encounter: Payer: Self-pay | Admitting: Oncology

## 2017-09-06 ENCOUNTER — Ambulatory Visit (HOSPITAL_COMMUNITY)
Admission: RE | Admit: 2017-09-06 | Discharge: 2017-09-06 | Disposition: A | Discharge status: Home / Self Care | Payer: Medicare HMO | Source: Ambulatory Visit | Attending: Cardiovascular Disease | Admitting: Cardiovascular Disease

## 2017-09-06 ENCOUNTER — Ambulatory Visit (HOSPITAL_BASED_OUTPATIENT_CLINIC_OR_DEPARTMENT_OTHER)
Admission: RE | Admit: 2017-09-06 | Discharge: 2017-09-06 | Disposition: A | Discharge status: Home / Self Care | Payer: Medicare HMO | Source: Ambulatory Visit | Attending: Cardiovascular Disease | Admitting: Cardiovascular Disease

## 2017-09-06 DIAGNOSIS — I10 Essential (primary) hypertension: Secondary | ICD-10-CM | POA: Insufficient documentation

## 2017-09-06 DIAGNOSIS — Z87891 Personal history of nicotine dependence: Secondary | ICD-10-CM | POA: Insufficient documentation

## 2017-09-06 DIAGNOSIS — E785 Hyperlipidemia, unspecified: Secondary | ICD-10-CM

## 2017-09-06 DIAGNOSIS — I2510 Atherosclerotic heart disease of native coronary artery without angina pectoris: Secondary | ICD-10-CM | POA: Diagnosis not present

## 2017-09-06 DIAGNOSIS — I739 Peripheral vascular disease, unspecified: Secondary | ICD-10-CM

## 2017-09-06 DIAGNOSIS — R9389 Abnormal findings on diagnostic imaging of other specified body structures: Secondary | ICD-10-CM | POA: Insufficient documentation

## 2017-09-06 DIAGNOSIS — I714 Abdominal aortic aneurysm, without rupture, unspecified: Secondary | ICD-10-CM

## 2017-09-07 ENCOUNTER — Other Ambulatory Visit: Payer: Self-pay | Admitting: Cardiology

## 2017-09-07 NOTE — Telephone Encounter (Signed)
REFILL 

## 2017-09-13 ENCOUNTER — Encounter: Payer: Self-pay | Admitting: Cardiology

## 2017-09-13 ENCOUNTER — Ambulatory Visit: Payer: Medicare HMO | Admitting: Cardiology

## 2017-09-13 VITALS — BP 162/78 | HR 76 | Ht 64.0 in | Wt 147.8 lb

## 2017-09-13 DIAGNOSIS — Z9861 Coronary angioplasty status: Secondary | ICD-10-CM

## 2017-09-13 DIAGNOSIS — I714 Abdominal aortic aneurysm, without rupture, unspecified: Secondary | ICD-10-CM

## 2017-09-13 DIAGNOSIS — I10 Essential (primary) hypertension: Secondary | ICD-10-CM | POA: Diagnosis not present

## 2017-09-13 DIAGNOSIS — I5032 Chronic diastolic (congestive) heart failure: Secondary | ICD-10-CM | POA: Diagnosis not present

## 2017-09-13 DIAGNOSIS — I739 Peripheral vascular disease, unspecified: Secondary | ICD-10-CM

## 2017-09-13 DIAGNOSIS — E785 Hyperlipidemia, unspecified: Secondary | ICD-10-CM | POA: Diagnosis not present

## 2017-09-13 DIAGNOSIS — I2510 Atherosclerotic heart disease of native coronary artery without angina pectoris: Secondary | ICD-10-CM | POA: Diagnosis not present

## 2017-09-13 DIAGNOSIS — I951 Orthostatic hypotension: Secondary | ICD-10-CM | POA: Diagnosis not present

## 2017-09-13 NOTE — Assessment & Plan Note (Signed)
Mild leg aching, no real claudication symptoms. ABIs look relatively stable. At this point I do not really think she needs to follow-up with additional studies unless he is having symptoms.  As such, we will simply monitor her symptoms and refer back to vascular surgery if indicated.  She will therefore cancel her appointment with Dr. Gwenlyn Found

## 2017-09-13 NOTE — Assessment & Plan Note (Signed)
No longer on atorvastatin, was stopped in the past due to fatigue.  Defer to PCP, but she would prefer not to start on a medication based on her age and functional status.

## 2017-09-13 NOTE — Assessment & Plan Note (Addendum)
Allowing for mild permissive hypertension therefore would not push amlodipine dose to far.

## 2017-09-13 NOTE — Progress Notes (Signed)
PCP: Drake Leach, MD  Clinic Note: Chief Complaint  Patient presents with  . Follow-up    No major complaints    HPI: [name] is a 84 y.o. female with a PMH below who presents today for 67-month follow-up for CAD-PCI as well as PVD. I last saw her in October 2017.  -CAD-PCI: Last cath July 2015. PAD: Atherectomy and stenting of ostial right common iliac as well as left iliac in 2007 (Dr. Gwenlyn Found) Legally blind due to macular degeneration.  [name] was last seen on July 26, 2017 by Jory Sims, NP -for preop evaluation for breast cancer surgery/lumpectomy.  Recent Hospitalizations: Aug 23, 2017, lumpectomy  Studies Personally Reviewed - (if available, images/films reviewed: From Epic Chart or Care Everywhere)  ABIs Sep 08, 2017: Bilateral ABIs normal range. --Recommend 1 year follow-up  AAA duplex:  dilation of the distal abdominal aorta up to 2.7 cm.  Stable.  Interval History: Returns today for the most part of exertional dyspnea, but is more limited by back pain.  She is doing quite well after her lumpectomy, but is upset because they did not "get clean margins".  She is reluctant to do consolidation XRT.  She denies any resting or exertional chest tightness pressure at home but really does not do much in the way of activity because of being blind and having a somewhat unsteady gait. With the amount of walking she does, she denies any significant exertional chest tightness or pressure. No PND, orthopnea or edema.  No syncope/near syncope, TIA or amaurosis fugax symptoms.  No rapid irregular heartbeats or palpitations.  Denies any palpitations at all despite having PVCs on EKG.  No claudication.  ROS: A comprehensive was performed. Review of Systems  Constitutional: Negative for malaise/fatigue and weight loss.  HENT: Positive for hearing loss.   Eyes:       Nearly blind because of macular degeneration  Respiratory: Positive for shortness of breath  (Baseline exertional dyspnea.  More limited by back pain).   Gastrointestinal: Negative for blood in stool and melena.  Genitourinary: Negative for hematuria.  Musculoskeletal: Positive for back pain and joint pain.  Neurological: Negative for dizziness and focal weakness.  Psychiatric/Behavioral: Negative for memory loss.  All other systems reviewed and are negative.   I have reviewed and (if needed) personally updated the patient's problem list, medications, allergies, past medical and surgical history, social and family history.   Past Medical History:  Diagnosis Date  . Abdominal aortic aneurysm ([place])    asymptomatic, 2012 -  2.6 x 2.4cm  . Anxiety   . CAD S/P percutaneous coronary angioplasty    Cath 2010: 100% occlusion of RCA  (2 BMS stents finally occluded) w/ left to right collaterals; moderate LAD and circumflex disease.  EF 55-60%;; cath 2015 with medical therapy recommended   . Chronic lower back pain   . Daily headache       . Depression   . Dyspnea    on exertion sometimes  . Exertional shortness of breath   . GERD (gastroesophageal reflux disease)   . History of kidney stones   . History of stomach ulcers   . Hyperlipidemia   . Hypertension   . Hypothyroidism   . Macular degeneration    "started out dry; now is wet" (01/08/2013)  . Migraines       . Myocardial infarction ([place]) 2003  . PAD (peripheral artery disease) ([place]) 12/2005   s/p L Common Iliac Stent  .  PONV (postoperative nausea and vomiting)   . Walking pneumonia 1970's    Past Surgical History:  Procedure Laterality Date  . ABD AOTRA DOPPLER  08/14/2012   distal mild dilatation 2.6 x 2.5cm unchanged;right cia 70-99%  new finding  . Abdominal Aorta Duplex  09/2014   No change from prior study; 2.6 x 2.4 cm mild AAA. R Com Iliac A  > 50%. -- f/u 24 months.  . ABDOMINAL AORTAGRAM  01/08/2013   Procedure: ABDOMINAL Maxcine Ham;  Surgeon: Lorretta Harp, MD;  Location: [facility] CATH LAB;  Service:  Cardiovascular;;  . ANGIOPLASTY Right 01/08/2013   unsucess stenting RLE   . APPENDECTOMY    . BREAST LUMPECTOMY WITH RADIOACTIVE SEED LOCALIZATION Left 08/23/2017   Procedure: BREAST LUMPECTOMY WITH RADIOACTIVE SEED LOCALIZATION;  Surgeon: Erroll Luna, MD;  Location: [place];  Service: General;  Laterality: Left;  . CARDIAC CATHETERIZATION  11/2008   diffuse ISR in  RCA w/ good L-R collaterals; AV groove Cx - 60-70% stenosis with post stenosis ectasia;  EF 55%, mild  CAD in LAD -- Med Rx  . CATARACT EXTRACTION W/ INTRAOCULAR LENS  IMPLANT, BILATERAL Bilateral   . CHOLECYSTECTOMY    . CORONARY ANGIOPLASTY  April 2006   Cutting Balloon PTCA of RCA ISR  . CORONARY ANGIOPLASTY WITH STENT PLACEMENT  Jan 2004   RCA - 2 overlapping Pixel BMS 2.0 mm x 18 mm & 2.0 mm x 23 m   . DILATION AND CURETTAGE OF UTERUS     "couple over the years" (01/08/2013)  . ILIAC ARTERY STENT Left 12/30/2005   PTA anddirect stenting  of left common iliac artery - 8.0 x 18 mm OmniLink stent  . ILIAC ARTERY STENT Right 02/13/2013   successful diamondback orbital rotation lipectomy, PTA and stenting of highly calcified ostial common iliac artery/notes 02/13/2013  . LEFT HEART CATHETERIZATION WITH CORONARY ANGIOGRAM N/A 11/13/2013   Procedure: LEFT HEART CATHETERIZATION WITH CORONARY ANGIOGRAM;  Surgeon: Leonie Man, MD; LAD 40%, CFX 60%, OM1 60%, RCA diffuse in-stent restenosis up to 99%, small PDA and PLA, collaterals from left system noted, EF 55-60 percent  . LOWER EXTREMITY ANGIOGRAM N/A 01/08/2013   Procedure: LOWER EXTREMITY ANGIOGRAM;  Surgeon: Lorretta Harp, MD;  Location[facility] CATH LAB;  Service: Cardiovascular;  Laterality: N/A;  . NM MYOCAR PERF WALL MOTION  12/27/2011 -[place]   LV normal,EF 81 %  . NM MYOCAR PERF WALL MOTION  01/2015   EF 56%. Normal ROM of low risk. Small defect of mild severity and apical anterior location likely related to breast attenuation. Cannot rule out small area of mild  ischemia.  . TRANSTHORACIC ECHOCARDIOGRAM  10/2013   Normal LV size low normal function. EF 50-55%. No regional wall motion abnormality. GR 1 DD. PAP ~ 47 mmHg    Current Meds  Medication Sig  . ALPRAZolam (XANAX) 0.5 MG tablet Take 0.5 mg by mouth daily as needed for anxiety.  [name] amLODipine (NORVASC) 2.5 MG tablet TAKE 1 TABLET EVERY DAY NEED MD APPOINTMENT FOR REFILLS  . anastrozole (ARIMIDEX) 1 MG tablet Take 1 tablet (1 mg total) by mouth daily.  . Cholecalciferol (VITAMIN D-3) 5000 units TABS Take 5,000 Units by mouth daily.  [name] levothyroxine (SYNTHROID, LEVOTHROID) 100 MCG tablet Take 100 mcg by mouth daily.  . nitroGLYCERIN (NITROSTAT) 0.4 MG SL tablet Place 1 tablet (0.4 mg total) under the tongue every 5 (five) minutes as needed for chest pain.  . Omega-3 Fatty Acids (FISH OIL)  1000 MG CAPS Take 1,000 mg by mouth daily.   [name] omeprazole (PRILOSEC) 40 MG capsule Take 40 mg by mouth daily as needed for heartburn or indigestion.  Vladimir Faster Glycol-Propyl Glycol (LUBRICANT EYE DROPS) 0.4-0.3 % SOLN Place 1 drop into both eyes daily.  . ranitidine (ZANTAC) 150 MG tablet Take 150 mg by mouth 2 (two) times daily as needed (FOR HEARTBURN/INDIGESTION/ACID REFLUX.).   [name] sertraline (ZOLOFT) 100 MG tablet Take 100 mg by mouth every evening.  . trandolapril (MAVIK) 2 MG tablet TAKE 1 TABLET EVERY DAY    Allergies  Allergen Reactions  . Avastin [Bevacizumab] Other (See Comments)    Muscle pain  . Benadryl [Diphenhydramine] Palpitations and Other (See Comments)    TACHYCARDIA  . Hydrocodone-Acetaminophen Nausea And Vomiting and Other (See Comments)    Delirium  . Levofloxacin Other (See Comments)    Myalgias  . Statins Other (See Comments)    Myalgias  . Trazodone Other (See Comments)    UNSPECIFIED REACTION "TERRIBLE SIDE EFFECTS"  . Beta Adrenergic Blockers Other (See Comments)    fatigue    Social History   Tobacco Use  . Smoking status: Former Smoker    Packs/day: 1.00     Years: 40.00    Pack years: 40.00    Types: Cigarettes    Last attempt to quit: 09/18/1987    Years since quitting: 30.0  . Smokeless tobacco: Never Used  Substance Use Topics  . Alcohol use: No  . Drug use: No   Social History   Social History Narrative   She is a widowed mother of 70, grandmother of 56. She'd like to try to exercise at least 2 times a week doing elliptical trainer about 20 minutes at a time. But she's noticed that she's not able to do as much as she used to mostly due to the hip and buttock pain.    family history is not on file.  Wt Readings from Last 3 Encounters:  09/13/17 147 lb 12.8 oz (67 kg)  09/02/17 147 lb 12.8 oz (67 kg)  08/23/17 147 lb (66.7 kg)    PHYSICAL EXAM BP (!) 162/78   Pulse 76   Ht 5\' 4"  (1.626 m)   Wt 147 lb 12.8 oz (67 kg)   SpO2 96%   BMI 25.37 kg/m  Physical Exam  Constitutional: She is oriented to person, place, and time. She appears well-developed and well-nourished. No distress.  Healthy-appearing.  Well-groomed.  HENT:  Head: Normocephalic and atraumatic.  Eyes:  EssentiallyBlind  Neck: Normal range of motion. Neck supple. No hepatojugular reflux and no JVD present. Carotid bruit is not present.  Cardiovascular: Normal rate, normal heart sounds, intact distal pulses and normal pulses.  No extrasystoles are present. PMI is not displaced. Exam reveals no gallop and no friction rub.  No murmur heard. Pulmonary/Chest: Effort normal and breath sounds normal. No respiratory distress. She has no wheezes. She has no rales.  Abdominal: Soft. Bowel sounds are normal. There is no tenderness. There is no rebound.  Musculoskeletal: Normal range of motion. She exhibits no edema.  Neurological: She is alert and oriented to person, place, and time.  Psychiatric: She has a normal mood and affect. Her behavior is normal. Judgment and thought content normal.  Vitals reviewed.    Adult ECG Report na  Other studies Reviewed: Additional  studies/ records that were reviewed today include:  Recent Labs:   Lab Results  Component Value Date   CREATININE 0.72 08/10/2017  BUN 11 08/10/2017   NA 138 08/10/2017   K 4.3 08/10/2017   CL 106 08/10/2017   CO2 24 08/10/2017    ASSESSMENT / PLAN: Problem List Items Addressed This Visit    PAD - s/p diamondback orbital rotation atherectomy, PTA + stenting of calcified ostical right CIA- 02/13/13 (Chronic)    Mild leg aching, no real claudication symptoms. ABIs look relatively stable. At this point I do not really think she needs to follow-up with additional studies unless he is having symptoms.  As such, we will simply monitor her symptoms and refer back to vascular surgery if indicated.  She will therefore cancel her appointment with Dr. Gwenlyn Found      Orthostatic hypotension (Chronic)    Allowing for mild permissive hypertension therefore would not push amlodipine dose to far.      Hyperlipidemia with target LDL less than 70 (Chronic)    No longer on atorvastatin, was stopped in the past due to fatigue.  Defer to PCP, but she would prefer not to start on a medication based on her age and functional status.      Essential hypertension - With permissive HTN (Chronic)   Chronic diastolic heart failure, NYHA class 1 (HCC) (Chronic)    Stable.  On ACE inhibitor for afterload reduction.  Does not require any diuresis.      CAD S/P percutaneous coronary angioplasty - Primary (Chronic)    Last cath was July 2015: AD 40%, CFX 60%, OM1 60%, RCA diffuse in-stent restenosis up to 99%, small PDA and PLA, collaterals from left system noted, EF 55-60 %.  Moderate circumflex and OM disease with occluded RCA.  No real active anginal symptoms.  She does have some exertional dyspnea but not out of the realm of normal for her.  Low risk Myoview in 2016.  She would prefer to not have any further testing is symptomatic. She is on aspirin, ACE inhibitor and calcium channel blocker, not on  beta-blocker due to fatigue. Continues to refrain from statin.      Abdominal aortic aneurysm (HCC) (Chronic)       Current medicines are reviewed at length with the patient today.  (+/- concerns) n/a  The following changes have been made:  n/a  Patient Instructions  NO CHANGE WITH CURRENT MEDICATIONS    WILL CANCEL YOUR APPOINTMENT WITH DR BERRY --  DOPPLER TEST NO CHANGES , YOU DO NOT HAVE ANY SYMPTOMS AT PRESENT TIME.   Your physician wants you to follow-up in [place]. You will receive a reminder letter in the mail two months in advance. If you don't receive a letter, please call our office to schedule the follow-up appointment.    If you need a refill on your cardiac medications before your next appointment, please call your pharmacy.    Studies Ordered:   No orders of the defined types were placed in this encounter.     Glenetta Hew, M.D., M.S. Interventional Cardiologist   Pager # [phone] Phone # [phone] [address]. [place], [place] [address]   Thank you for choosing [facility] at [facility]!!

## 2017-09-13 NOTE — Assessment & Plan Note (Signed)
Last cath was July 2015: AD 40%, CFX 60%, OM1 60%, RCA diffuse in-stent restenosis up to 99%, small PDA and PLA, collaterals from left system noted, EF 55-60 %.  Moderate circumflex and OM disease with occluded RCA.  No real active anginal symptoms.  She does have some exertional dyspnea but not out of the realm of normal for her.  Low risk Myoview in 2016.  She would prefer to not have any further testing is symptomatic. She is on aspirin, ACE inhibitor and calcium channel blocker, not on beta-blocker due to fatigue. Continues to refrain from statin.

## 2017-09-13 NOTE — Assessment & Plan Note (Signed)
Stable.  On ACE inhibitor for afterload reduction.  Does not require any diuresis.

## 2017-09-13 NOTE — Patient Instructions (Addendum)
NO CHANGE WITH CURRENT MEDICATIONS    WILL CANCEL YOUR APPOINTMENT WITH DR BERRY --  DOPPLER TEST NO CHANGES , YOU DO NOT HAVE ANY SYMPTOMS AT PRESENT TIME.   Your physician wants you to follow-up in [place]. You will receive a reminder letter in the mail two months in advance. If you don't receive a letter, please call our office to schedule the follow-up appointment.    If you need a refill on your cardiac medications before your next appointment, please call your pharmacy.

## 2017-09-15 NOTE — Progress Notes (Signed)
error 

## 2017-09-20 ENCOUNTER — Ambulatory Visit
Admission: RE | Admit: 2017-09-20 | Discharge: 2017-09-20 | Disposition: A | Discharge status: Home / Self Care | Payer: Medicare HMO | Source: Ambulatory Visit | Attending: Radiation Oncology | Admitting: Radiation Oncology

## 2017-09-20 ENCOUNTER — Ambulatory Visit: Payer: Medicare HMO

## 2017-09-20 ENCOUNTER — Other Ambulatory Visit: Payer: Self-pay | Admitting: Oncology

## 2017-09-21 ENCOUNTER — Other Ambulatory Visit: Payer: Self-pay | Admitting: Oncology

## 2017-09-21 ENCOUNTER — Ambulatory Visit: Payer: Medicare HMO | Admitting: Cardiovascular Disease

## 2017-09-21 NOTE — Progress Notes (Unsigned)
[place]  [phone] [phone]     ID: [name] DOB: November 15, 1929  MR#: [id number]  HTD#:[id number]  Patient Care Team: Drake Leach, MD as PCP - General (Internal Medicine) Leonie Man, MD as PCP - Cardiology (Cardiology) Mak Bonny, Virgie Dad, MD as Consulting Physician (Oncology) Erroll Luna, MD as Consulting Physician (General Surgery) Leonie Man, MD as Consulting Physician (Cardiology) Juanita Craver, MD as Consulting Physician (Gastroenterology) Eppie Gibson, MD as Attending Physician (Radiation Oncology) OTHER MD:  CHIEF COMPLAINT: Estrogen receptor positive breast cancer  CURRENT TREATMENT: Anastrozole; adjuvant radiation pending   HISTORY OF CURRENT ILLNESS: From the original intake note:  [name] had an annual wellness check up on 06/22/2017. Her PCP palpated a possible mass in the left breast and recommended that she have a diagnostic mammogram. She underwent bilateral diagnostic mammography with tomography and left breast ultrasonography at [facility] on 07/05/2017 showing: breast density category C. There is an irregular asymmetry in the left breast the 5 o'clock position lower outer quadrant. Sonographically, there was a mass in the 2 o'clock upper outer position anterior depth of the left breast measuring 2 cm. No abnormalities were found in the left axilla.   Accordingly on 07/06/2017 she proceeded to biopsy of the left breast area in question. The pathology from this procedure showed ([id number]): Invasive ductal carcinoma, grade II.  Also ductal carcinoma in situ. Prognostic indicators significant for: estrogen receptor, 100% positive and progesterone receptor, 100% positive, both with strong staining intensity. Proliferation marker Ki67 at 12%. HER2 not amplified with ratios HER2/CEP17 signals 1.37 and average HER2 copies per cell 1.85  The patient's subsequent history is as detailed below.  INTERVAL  HISTORY: Torry returns today for follow up and treatment of her estrogen receptor positive breast cancer accompanied by her daughter. Since her last visit, she underwent left lumpectomy ([id number]) on 08/23/2017 showing: Invasive ductal carcinoma grade III, spanning 2.1 cm. High grade DCIS. Carcinoma focally involves the anterior margin and is less than 1 mm from the lateral margin. Carcinoma is 4 mm from the superior margin. DCIS is less than 1 mm from the anterior margin and is 1 mm from the lateral margin.    REVIEW OF SYSTEMS: Jamisyn reports that she has some pain from her surgery, but it is not severe enough to take pain medication. It does not limit her daily activities. She did not have any other complications from surgery.  She denies unusual headaches, visual changes, nausea, vomiting, or dizziness. There has been no unusual cough, phlegm production, or pleurisy. This been no change in bowel or bladder habits. She denies unexplained fatigue or unexplained weight loss, bleeding, rash, or fever. A detailed review of systems was otherwise stable.     PAST MEDICAL HISTORY: Past Medical History:  Diagnosis Date  . Abdominal aortic aneurysm ([place])    asymptomatic, 2012 -  2.6 x 2.4cm  . Anxiety   . CAD S/P percutaneous coronary angioplasty    Cath 2010: 100% occlusion of RCA  (2 BMS stents finally occluded) w/ left to right collaterals; moderate LAD and circumflex disease.  EF 55-60%;; cath 2015 with medical therapy recommended   . Chronic lower back pain   . Daily headache       . Depression   . Dyspnea    on exertion sometimes  . Exertional shortness of breath   . GERD (gastroesophageal reflux disease)   . History of kidney stones   . History of stomach ulcers   .  Hyperlipidemia   . Hypertension   . Hypothyroidism   . Macular degeneration    "started out dry; now is wet" (01/08/2013)  . Migraines       . Myocardial infarction ([place]) 2003  . PAD (peripheral artery disease) ([place])  12/2005   s/p L Common Iliac Stent  . PONV (postoperative nausea and vomiting)   . Walking pneumonia 1970's  Legally blind  PAST SURGICAL HISTORY: Past Surgical History:  Procedure Laterality Date  . ABD AOTRA DOPPLER  08/14/2012   distal mild dilatation 2.6 x 2.5cm unchanged;right cia 70-99%  new finding  . Abdominal Aorta Duplex  09/2014   No change from prior study; 2.6 x 2.4 cm mild AAA. R Com Iliac A  > 50%. -- f/u 24 months.  . ABDOMINAL AORTAGRAM  01/08/2013   Procedure: ABDOMINAL Maxcine Ham;  Surgeon: Lorretta Harp, MD;  Location: [facility] CATH LAB;  Service: Cardiovascular;;  . ANGIOPLASTY Right 01/08/2013   unsucess stenting RLE   . APPENDECTOMY    . BREAST LUMPECTOMY WITH RADIOACTIVE SEED LOCALIZATION Left 08/23/2017   Procedure: BREAST LUMPECTOMY WITH RADIOACTIVE SEED LOCALIZATION;  Surgeon: Erroll Luna, MD;  Location: [facility];  Service: General;  Laterality: Left;  . CARDIAC CATHETERIZATION  11/2008   diffuse ISR in  RCA w/ good L-R collaterals; AV groove Cx - 60-70% stenosis with post stenosis ectasia;  EF 55%, mild  CAD in LAD -- Med Rx  . CATARACT EXTRACTION W/ INTRAOCULAR LENS  IMPLANT, BILATERAL Bilateral   . CHOLECYSTECTOMY    . CORONARY ANGIOPLASTY  April 2006   Cutting Balloon PTCA of RCA ISR  . CORONARY ANGIOPLASTY WITH STENT PLACEMENT  Jan 2004   RCA - 2 overlapping Pixel BMS 2.0 mm x 18 mm & 2.0 mm x 23 m   . DILATION AND CURETTAGE OF UTERUS     "couple over the years" (01/08/2013)  . ILIAC ARTERY STENT Left 12/30/2005   PTA anddirect stenting  of left common iliac artery - 8.0 x 18 mm OmniLink stent  . ILIAC ARTERY STENT Right 02/13/2013   successful diamondback orbital rotation lipectomy, PTA and stenting of highly calcified ostial common iliac artery/notes 02/13/2013  . LEFT HEART CATHETERIZATION WITH CORONARY ANGIOGRAM N/A 11/13/2013   Procedure: LEFT HEART CATHETERIZATION WITH CORONARY ANGIOGRAM;  Surgeon: Leonie Man, MD; LAD 40%, CFX 60%, OM1 60%, RCA  diffuse in-stent restenosis up to 99%, small PDA and PLA, collaterals from left system noted, EF 55-60 percent  . LOWER EXTREMITY ANGIOGRAM N/A 01/08/2013   Procedure: LOWER EXTREMITY ANGIOGRAM;  Surgeon: Lorretta Harp, MD;  Location[facility] LAB;  Service: Cardiovascular;  Laterality: N/A;  . NM MYOCAR PERF WALL MOTION  12/27/2011 -[place]   LV normal,EF 81 %  . NM MYOCAR PERF WALL MOTION  01/2015   EF 56%. Normal ROM of low risk. Small defect of mild severity and apical anterior location likely related to breast attenuation. Cannot rule out small area of mild ischemia.  . TRANSTHORACIC ECHOCARDIOGRAM  10/2013   Normal LV size low normal function. EF 50-55%. No regional wall motion abnormality. GR 1 DD. PAP ~ 47 mmHg    FAMILY HISTORY No family history on file.  The patient's father died at age 43 due to diabetes and heart issues. The patient's mother died at age 70 due to old age. The patient has 6 brothers and 2 sisters. She denies a family history of breast or ovarian cancer.   GYNECOLOGIC HISTORY:  No LMP  recorded. Patient is postmenopausal. Menarche: 84 years old Age at first live birth: 84 years old She is GXP4.  Her LMP was at age 62.  She never used contraceptives. 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 She is widowed and lives by herself, with no pets. Daughter, [name], lives in [place] and is retired. Son, Juanda Crumble, lives in [place] and is a Administrator. Son, Coralyn Mark, lives in [place] and is a Freight forwarder. The patient's son, Cecilie Lowers died only a few months ago from a sudden intracranial bleed. The patient has 6 grandchildren and 1 great-grandchild. She attends [employer] in [place].     ADVANCED DIRECTIVES: Suzi Roots (her daughter) is healthcare power of attorney   HEALTH MAINTENANCE: Social History   Tobacco Use  . Smoking status: Former Smoker    Packs/day: 1.00    Years:  40.00    Pack years: 40.00    Types: Cigarettes    Last attempt to quit: 09/18/1987    Years since quitting: 30.0  . Smokeless tobacco: Never Used  Substance Use Topics  . Alcohol use: No  . Drug use: No     Colonoscopy: 2009 under Dr. Collene Mares  PAP: 10+ years ago  Bone density: 2002 ostepenia   Allergies  Allergen Reactions  . Avastin [Bevacizumab] Other (See Comments)    Muscle pain  . Benadryl [Diphenhydramine] Palpitations and Other (See Comments)    TACHYCARDIA  . Hydrocodone-Acetaminophen Nausea And Vomiting and Other (See Comments)    Delirium  . Levofloxacin Other (See Comments)    Myalgias  . Statins Other (See Comments)    Myalgias  . Trazodone Other (See Comments)    UNSPECIFIED REACTION "TERRIBLE SIDE EFFECTS"  . Beta Adrenergic Blockers Other (See Comments)    fatigue    Current Outpatient Medications  Medication Sig Dispense Refill  . ALPRAZolam (XANAX) 0.5 MG tablet Take 0.5 mg by mouth daily as needed for anxiety.    [name] amLODipine (NORVASC) 2.5 MG tablet TAKE 1 TABLET EVERY DAY NEED MD APPOINTMENT FOR REFILLS 180 tablet 1  . anastrozole (ARIMIDEX) 1 MG tablet Take 1 tablet (1 mg total) by mouth daily. 90 tablet 4  . Cholecalciferol (VITAMIN D-3) 5000 units TABS Take 5,000 Units by mouth daily.    [name] levothyroxine (SYNTHROID, LEVOTHROID) 100 MCG tablet Take 100 mcg by mouth daily.    . nitroGLYCERIN (NITROSTAT) 0.4 MG SL tablet Place 1 tablet (0.4 mg total) under the tongue every 5 (five) minutes as needed for chest pain. 25 tablet 4  . Omega-3 Fatty Acids (FISH OIL) 1000 MG CAPS Take 1,000 mg by mouth daily.     [name] omeprazole (PRILOSEC) 40 MG capsule Take 40 mg by mouth daily as needed for heartburn or indigestion.    Vladimir Faster Glycol-Propyl Glycol (LUBRICANT EYE DROPS) 0.4-0.3 % SOLN Place 1 drop into both eyes daily.    . ranitidine (ZANTAC) 150 MG tablet Take 150 mg by mouth 2 (two) times daily as needed (FOR HEARTBURN/INDIGESTION/ACID REFLUX.).     [name]  sertraline (ZOLOFT) 100 MG tablet Take 100 mg by mouth every evening.    . trandolapril (MAVIK) 2 MG tablet TAKE 1 TABLET EVERY DAY 90 tablet 1   No current facility-administered medications for this visit.     OBJECTIVE: Older white woman who appears stated age  There were no vitals filed for this visit.  There is no height or weight on file to calculate BMI.   Wt Readings from Last 3 Encounters:  09/13/17 147 lb 12.8 oz (67 kg)  09/02/17 147 lb 12.8 oz (67 kg)  08/23/17 147 lb (66.7 kg)      ECOG FS:1 - Symptomatic but completely ambulatory  Sclerae unicteric, EOMs intact Oropharynx clear and moist No cervical or supraclavicular adenopathy Lungs no rales or rhonchi Heart regular rate and rhythm Abd soft, nontender, positive bowel sounds MSK no focal spinal tenderness, no upper extremity lymphedema Neuro: nonfocal, well oriented, appropriate affect Breasts: The incision in the left breast is healing nicely, with no dehiscence, erythema, or swelling.  The cosmetic result is excellent.   LAB RESULTS:  CMP     Component Value Date/Time   NA 138 08/10/2017 1021   K 4.3 08/10/2017 1021   CL 106 08/10/2017 1021   CO2 24 08/10/2017 1021   GLUCOSE 90 08/10/2017 1021   BUN 11 08/10/2017 1021   CREATININE 0.72 08/10/2017 1021   CREATININE 0.73 11/07/2013 1346   CALCIUM 9.3 08/10/2017 1021   PROT 6.3 (L) 04/02/2016 2042   ALBUMIN 3.6 04/02/2016 2042   AST 27 04/02/2016 2042   ALT 21 04/02/2016 2042   ALKPHOS 63 04/02/2016 2042   BILITOT 0.3 04/02/2016 2042   GFRNONAA >60 08/10/2017 1021   GFRAA >60 08/10/2017 1021    No results found for: TOTALPROTELP, ALBUMINELP, A1GS, A2GS, BETS, BETA2SER, GAMS, MSPIKE, SPEI  No results found for: KPAFRELGTCHN, LAMBDASER, KAPLAMBRATIO  Lab Results  Component Value Date   WBC 6.0 08/10/2017   NEUTROABS 3.5 11/26/2008   HGB 13.3 08/10/2017   HCT 40.8 08/10/2017   MCV 99.0 08/10/2017   PLT 192 08/10/2017     @LASTCHEMISTRY @  No results found for: LABCA2  No components found for: [id number]  No results for input(s): INR in the last 168 hours.  No results found for: LABCA2  No results found for: ZHY865  No results found for: HQI696  No results found for: EXB284  No results found for: CA2729  No components found for: HGQUANT  No results found for: CEA1 / No results found for: CEA1   No results found for: AFPTUMOR  No results found for: CHROMOGRNA  No results found for: PSA1  No visits with results within 3 Day(s) from this visit.  Latest known visit with results is:  Hospital Outpatient Visit on 08/10/2017  Component Date Value Ref Range Status  . Sodium 08/10/2017 138  135 - 145 mmol/L Final  . Potassium 08/10/2017 4.3  3.5 - 5.1 mmol/L Final  . Chloride 08/10/2017 106  101 - 111 mmol/L Final  . CO2 08/10/2017 24  22 - 32 mmol/L Final  . Glucose, Bld 08/10/2017 90  65 - 99 mg/dL Final  . BUN 08/10/2017 11  6 - 20 mg/dL Final  . Creatinine, Ser 08/10/2017 0.72  0.44 - 1.00 mg/dL Final  . Calcium 08/10/2017 9.3  8.9 - 10.3 mg/dL Final  . GFR calc non Af Amer 08/10/2017 >60  >60 mL/min Final  . GFR calc Af Amer 08/10/2017 >60  >60 mL/min Final   Comment: (NOTE) The eGFR has been calculated using the CKD EPI equation. This calculation has not been validated in all clinical situations. eGFR's persistently <60 mL/min signify possible Chronic Kidney Disease.   Georgiann Hahn gap 08/10/2017 8  5 - 15 Final   Performed at [facility], [place] [address], [place], [place] [address]  .  WBC 08/10/2017 6.0  4.0 - 10.5 K/uL Final  . RBC 08/10/2017 4.12  3.87 - 5.11 MIL/uL Final  . Hemoglobin 08/10/2017 13.3  12.0 - 15.0 g/dL Final  . HCT 08/10/2017 40.8  36.0 - 46.0 % Final  . MCV 08/10/2017 99.0  78.0 - 100.0 fL Final  . MCH 08/10/2017 32.3  26.0 - 34.0 pg Final  . MCHC 08/10/2017 32.6  30.0 - 36.0 g/dL Final  . RDW 08/10/2017 12.4  11.5 - 15.5 % Final  . Platelets 08/10/2017  192  150 - 400 K/uL Final   Performed at [facility], [place] [address], [place], [place] [address]    (this displays the last labs from the last 3 days)  No results found for: TOTALPROTELP, ALBUMINELP, A1GS, A2GS, BETS, BETA2SER, GAMS, MSPIKE, SPEI (this displays SPEP labs)  No results found for: KPAFRELGTCHN, LAMBDASER, KAPLAMBRATIO (kappa/lambda light chains)  No results found for: HGBA, HGBA2QUANT, HGBFQUANT, HGBSQUAN (Hemoglobinopathy evaluation)   No results found for: LDH  No results found for: IRON, TIBC, IRONPCTSAT (Iron and TIBC)  No results found for: FERRITIN  Urinalysis    Component Value Date/Time   COLORURINE YELLOW 04/02/2016 2136   APPEARANCEUR HAZY (A) 04/02/2016 2136   LABSPEC 1.018 04/02/2016 2136   PHURINE 5.0 04/02/2016 2136   GLUCOSEU NEGATIVE 04/02/2016 2136   HGBUR NEGATIVE 04/02/2016 2136   BILIRUBINUR NEGATIVE 04/02/2016 2136   KETONESUR NEGATIVE 04/02/2016 2136   PROTEINUR NEGATIVE 04/02/2016 2136   NITRITE NEGATIVE 04/02/2016 2136   LEUKOCYTESUR LARGE (A) 04/02/2016 2136     STUDIES: Pathology reviewed with the patient and daughter  ELIGIBLE FOR AVAILABLE RESEARCH PROTOCOL: Exact sciences study  ASSESSMENT: 84 y.o. [place], [place] woman status post left breast upper outer quadrant biopsy 07/06/2017 for a clinical T1c N0, stage IA invasive ductal carcinoma, grade 2, estrogen and progesterone receptor positive, HER-2 not amplified, with an MIB-1 of 12%  (1) status post left lumpectomy without sentinel lymph node sampling 08/23/2017 for a pT2 pNX, stage IIA invasive ductal carcinoma, grade 3, with a positive anterior margin (skin), and multiple close margins  (2) adjuvant radiation pending  (3) anastrozole started 09/02/2017.  PLAN:  Shante did generally very well with her surgery.  We went over the pathology results in detail.  They understand there are multiple close margins and that this is a concern.  Whereas originally we were hoping  the patient could forego radiation, at this point I think radiation is in her interest.  The patient had originally been scheduled to see Dr. Lisbeth Renshaw but she actually never did see him.  They would like to see Dr. Isidore Moos because a good friend of hers has Dr. Isidore Moos as a physician.  I am trying to get an appointment for the patient with Dr. Isidore Moos the first week in June which is when the daughter will be back in town and unable to participate in the decision-making.  We are going to present the case this coming week at the multidisciplinary conference to make sure that radiation would be adequate and that they do not do have to go back to improve the surgical margins  At this think I think it would be prudent to start anastrozole port we discussed the possible toxicity side effects and complications of this agent and I went ahead and wrote her the prescription.  I am also setting her up for a bone density at [facility] to be done within the next month.    Honest Vanleer, Virgie Dad,  MD  09/21/17 8:32 AM Medical Oncology and Hematology [facility] [address] [place], [place] [address] Tel. [phone]    Fax. [phone]  This document serves as a record of services personally performed by Lurline Del, MD. It was created on his behalf by Sheron Nightingale, a trained medical scribe. The creation of this record is based on the scribe's personal observations and the provider's statements to them.   I have reviewed the above documentation for accuracy and completeness, and I agree with the above.

## 2017-10-28 ENCOUNTER — Telehealth: Payer: Self-pay | Admitting: Oncology

## 2017-10-28 ENCOUNTER — Other Ambulatory Visit: Payer: Self-pay | Admitting: Oncology

## 2017-10-28 ENCOUNTER — Inpatient Hospital Stay: Payer: Medicare HMO | Attending: Oncology | Admitting: Oncology

## 2017-10-28 VITALS — BP 149/68 | HR 82 | Temp 97.8°F | Resp 18 | Ht 64.0 in | Wt 147.4 lb

## 2017-10-28 DIAGNOSIS — C50412 Malignant neoplasm of upper-outer quadrant of left female breast: Secondary | ICD-10-CM

## 2017-10-28 DIAGNOSIS — E785 Hyperlipidemia, unspecified: Secondary | ICD-10-CM | POA: Insufficient documentation

## 2017-10-28 DIAGNOSIS — Z79899 Other long term (current) drug therapy: Secondary | ICD-10-CM | POA: Insufficient documentation

## 2017-10-28 DIAGNOSIS — F418 Other specified anxiety disorders: Secondary | ICD-10-CM | POA: Insufficient documentation

## 2017-10-28 DIAGNOSIS — Z170 Estrogen receptor positive status [ER+]: Secondary | ICD-10-CM | POA: Insufficient documentation

## 2017-10-28 DIAGNOSIS — Z87442 Personal history of urinary calculi: Secondary | ICD-10-CM | POA: Insufficient documentation

## 2017-10-28 DIAGNOSIS — E039 Hypothyroidism, unspecified: Secondary | ICD-10-CM | POA: Insufficient documentation

## 2017-10-28 DIAGNOSIS — R531 Weakness: Secondary | ICD-10-CM | POA: Insufficient documentation

## 2017-10-28 DIAGNOSIS — R5383 Other fatigue: Secondary | ICD-10-CM | POA: Insufficient documentation

## 2017-10-28 DIAGNOSIS — Z87891 Personal history of nicotine dependence: Secondary | ICD-10-CM | POA: Diagnosis not present

## 2017-10-28 DIAGNOSIS — K219 Gastro-esophageal reflux disease without esophagitis: Secondary | ICD-10-CM | POA: Diagnosis not present

## 2017-10-28 DIAGNOSIS — I714 Abdominal aortic aneurysm, without rupture: Secondary | ICD-10-CM

## 2017-10-28 DIAGNOSIS — I2510 Atherosclerotic heart disease of native coronary artery without angina pectoris: Secondary | ICD-10-CM

## 2017-10-28 DIAGNOSIS — I252 Old myocardial infarction: Secondary | ICD-10-CM | POA: Diagnosis not present

## 2017-10-28 MED ORDER — METHYLPHENIDATE HCL 5 MG PO TABS: 5.0000 mg | ORAL_TABLET | Freq: Every day | ORAL | 0 refills | Status: DC

## 2017-10-28 NOTE — Telephone Encounter (Signed)
Gave patient avs and calendar of upcoming oct appts.  °

## 2017-10-28 NOTE — Progress Notes (Signed)
[name]  [phone] Fax:([phone]) [phone]     ID: [name] DOB: July 14, 1929  MR#: [id number]  YIF#:[id number]  Patient Care Team: Drake Leach, MD as PCP - General (Internal Medicine) Leonie Man, MD as PCP - Cardiology (Cardiology) Magrinat, Virgie Dad, MD as Consulting Physician (Oncology) Erroll Luna, MD as Consulting Physician (General Surgery) Leonie Man, MD as Consulting Physician (Cardiology) Juanita Craver, MD as Consulting Physician (Gastroenterology) Eppie Gibson, MD as Attending Physician (Radiation Oncology) OTHER MD:  CHIEF COMPLAINT: Estrogen receptor positive breast [name]  CURRENT TREATMENT: Observation   HISTORY OF CURRENT ILLNESS: From the original intake note:  [name] had an annual wellness check up on 06/22/2017. Her PCP palpated a possible mass in the left breast and recommended that she have a diagnostic mammogram. She underwent bilateral diagnostic mammography with tomography and left breast ultrasonography at [facility] on 07/05/2017 showing: breast density category C. There is an irregular asymmetry in the left breast the 5 o'clock position lower outer quadrant. Sonographically, there was a mass in the 2 o'clock upper outer position anterior depth of the left breast measuring 2 cm. No abnormalities were found in the left axilla.   Accordingly on 07/06/2017 she proceeded to biopsy of the left breast area in question. The pathology from this procedure showed ([id number]): Invasive ductal carcinoma, grade II.  Also ductal carcinoma in situ. Prognostic indicators significant for: estrogen receptor, 100% positive and progesterone receptor, 100% positive, both with strong staining intensity. Proliferation marker Ki67 at 12%. HER2 not amplified with ratios HER2/CEP17 signals 1.37 and average HER2 copies per cell 1.85  The patient's subsequent history is as detailed below.  INTERVAL HISTORY: [name] returns today for  follow-up of her estrogen receptor positive breast [name]. She is accompanied by her daughter Suzi Roots.    On October 10, 2017 I got an email from [place] telling me that her mother was taking the anastrozole and barely able to function.  She was already feeling tired but now her fatigue was debilitating.  We discussed it and decided to stop the medication and discuss it further when she came in today for a visit  REVIEW OF SYSTEMS: [name] reports feeling weak and tired all the time. She endorses some occasional pain to her left breast. For exercise she rides the bicycle. Walking irritates her lower back pain. The patient denies unusual headaches, visual changes, nausea, vomiting, or dizziness. There has been no unusual cough, phlegm production, or pleurisy. This been no change in bowel or bladder habits. The patient denies unexplained fatigue or unexplained weight loss, bleeding, rash, or fever. A detailed review of systems was otherwise noncontributory.   PAST MEDICAL HISTORY: Past Medical History:  Diagnosis Date  . Abdominal aortic aneurysm ([place])    asymptomatic, 2012 -  2.6 x 2.4cm  . Anxiety   . CAD S/P percutaneous coronary angioplasty    Cath 2010: 100% occlusion of RCA  (2 BMS stents finally occluded) w/ left to right collaterals; moderate LAD and circumflex disease.  EF 55-60%;; cath 2015 with medical therapy recommended   . Chronic lower back pain   . Daily headache       . Depression   . Dyspnea    on exertion sometimes  . Exertional shortness of breath   . GERD (gastroesophageal reflux disease)   . History of kidney stones   . History of stomach ulcers   . Hyperlipidemia   . Hypertension   . Hypothyroidism   .  Macular degeneration    "started out dry; now is wet" (01/08/2013)  . Migraines       . Myocardial infarction ([place]) 2003  . PAD (peripheral artery disease) ([name]) 12/2005   s/p L Common Iliac Stent  . PONV (postoperative nausea and vomiting)   . Walking pneumonia  1970's  Legally blind  PAST SURGICAL HISTORY: Past Surgical History:  Procedure Laterality Date  . ABD AOTRA DOPPLER  08/14/2012   distal mild dilatation 2.6 x 2.5cm unchanged;right cia 70-99%  new finding  . Abdominal Aorta Duplex  09/2014   No change from prior study; 2.6 x 2.4 cm mild AAA. R Com Iliac A  > 50%. -- f/u 24 months.  . ABDOMINAL AORTAGRAM  01/08/2013   Procedure: ABDOMINAL Maxcine Ham;  Surgeon: Lorretta Harp, MD;  Location: [facility] CATH LAB;  Service: Cardiovascular;;  . ANGIOPLASTY Right 01/08/2013   unsucess stenting RLE   . APPENDECTOMY    . BREAST LUMPECTOMY WITH RADIOACTIVE SEED LOCALIZATION Left 08/23/2017   Procedure: BREAST LUMPECTOMY WITH RADIOACTIVE SEED LOCALIZATION;  Surgeon: Erroll Luna, MD;  Location: [place];  Service: General;  Laterality: Left;  . CARDIAC CATHETERIZATION  11/2008   diffuse ISR in  RCA w/ good L-R collaterals; AV groove Cx - 60-70% stenosis with post stenosis ectasia;  EF 55%, mild  CAD in LAD -- Med Rx  . CATARACT EXTRACTION W/ INTRAOCULAR LENS  IMPLANT, BILATERAL Bilateral   . CHOLECYSTECTOMY    . CORONARY ANGIOPLASTY  April 2006   Cutting Balloon PTCA of RCA ISR  . CORONARY ANGIOPLASTY WITH STENT PLACEMENT  Jan 2004   RCA - 2 overlapping Pixel BMS 2.0 mm x 18 mm & 2.0 mm x 23 m   . DILATION AND CURETTAGE OF UTERUS     "couple over the years" (01/08/2013)  . ILIAC ARTERY STENT Left 12/30/2005   PTA anddirect stenting  of left common iliac artery - 8.0 x 18 mm OmniLink stent  . ILIAC ARTERY STENT Right 02/13/2013   successful diamondback orbital rotation lipectomy, PTA and stenting of highly calcified ostial common iliac artery/notes 02/13/2013  . LEFT HEART CATHETERIZATION WITH CORONARY ANGIOGRAM N/A 11/13/2013   Procedure: LEFT HEART CATHETERIZATION WITH CORONARY ANGIOGRAM;  Surgeon: Leonie Man, MD; LAD 40%, CFX 60%, OM1 60%, RCA diffuse in-stent restenosis up to 99%, small PDA and PLA, collaterals from left system noted, EF 55-60  percent  . LOWER EXTREMITY ANGIOGRAM N/A 01/08/2013   Procedure: LOWER EXTREMITY ANGIOGRAM;  Surgeon: Lorretta Harp, MD;  Location: [facility] CATH LAB;  Service: Cardiovascular;  Laterality: N/A;  . NM MYOCAR PERF WALL MOTION  12/27/2011 -[place]   LV normal,EF 81 %  . NM MYOCAR PERF WALL MOTION  01/2015   EF 56%. Normal ROM of low risk. Small defect of mild severity and apical anterior location likely related to breast attenuation. Cannot rule out small area of mild ischemia.  . TRANSTHORACIC ECHOCARDIOGRAM  10/2013   Normal LV size low normal function. EF 50-55%. No regional wall motion abnormality. GR 1 DD. PAP ~ 47 mmHg    FAMILY HISTORY No family history on file.  The patient's father died at age 21 due to diabetes and heart issues. The patient's mother died at age 44 due to old age. The patient has 6 brothers and 2 sisters. She denies a family history of breast or ovarian [name].   GYNECOLOGIC HISTORY:  No LMP recorded. Patient is postmenopausal. Menarche: 84 years old Age at first live  birth: 84 years old She is GXP4.  Her LMP was at age 21.  She never used contraceptives. 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 She is widowed and lives by herself, with no pets. Daughter, [name], lives in [place] and is retired. Son, Juanda Crumble, lives in [place] and is a Administrator. Son, Coralyn Mark, lives in [place] and is a Freight forwarder. The patient's son, Cecilie Lowers died only a few months ago from a sudden intracranial bleed. The patient has 6 grandchildren and 1 great-grandchild. She attends [employer] in [place].     ADVANCED DIRECTIVES: Suzi Roots (her daughter) is healthcare power of attorney   HEALTH MAINTENANCE: Social History   Tobacco Use  . Smoking status: Former Smoker    Packs/day: 1.00    Years: 40.00    Pack years: 40.00    Types: Cigarettes    Last attempt to quit: 09/18/1987    Years since  quitting: 30.1  . Smokeless tobacco: Never Used  Substance Use Topics  . Alcohol use: No  . Drug use: No     Colonoscopy: 2009 under Dr. Collene Mares  PAP: 10+ years ago  Bone density: 2002 ostepenia   Allergies  Allergen Reactions  . Avastin [Bevacizumab] Other (See Comments)    Muscle pain  . Benadryl [Diphenhydramine] Palpitations and Other (See Comments)    TACHYCARDIA  . Hydrocodone-Acetaminophen Nausea And Vomiting and Other (See Comments)    Delirium  . Levofloxacin Other (See Comments)    Myalgias  . Statins Other (See Comments)    Myalgias  . Trazodone Other (See Comments)    UNSPECIFIED REACTION "TERRIBLE SIDE EFFECTS"  . Beta Adrenergic Blockers Other (See Comments)    fatigue    Current Outpatient Medications  Medication Sig Dispense Refill  . ALPRAZolam (XANAX) 0.5 MG tablet Take 0.5 mg by mouth daily as needed for anxiety.    [name] amLODipine (NORVASC) 2.5 MG tablet TAKE 1 TABLET EVERY DAY NEED MD APPOINTMENT FOR REFILLS 180 tablet 1  . anastrozole (ARIMIDEX) 1 MG tablet Take 1 tablet (1 mg total) by mouth daily. 90 tablet 4  . Cholecalciferol (VITAMIN D-3) 5000 units TABS Take 5,000 Units by mouth daily.    [name] levothyroxine (SYNTHROID, LEVOTHROID) 100 MCG tablet Take 100 mcg by mouth daily.    . nitroGLYCERIN (NITROSTAT) 0.4 MG SL tablet Place 1 tablet (0.4 mg total) under the tongue every 5 (five) minutes as needed for chest pain. 25 tablet 4  . Omega-3 Fatty Acids (FISH OIL) 1000 MG CAPS Take 1,000 mg by mouth daily.     [name] omeprazole (PRILOSEC) 40 MG capsule Take 40 mg by mouth daily as needed for heartburn or indigestion.    Vladimir Faster Glycol-Propyl Glycol (LUBRICANT EYE DROPS) 0.4-0.3 % SOLN Place 1 drop into both eyes daily.    . ranitidine (ZANTAC) 150 MG tablet Take 150 mg by mouth 2 (two) times daily as needed (FOR HEARTBURN/INDIGESTION/ACID REFLUX.).     [name] sertraline (ZOLOFT) 100 MG tablet Take 100 mg by mouth every evening.    . trandolapril (MAVIK) 2 MG tablet  TAKE 1 TABLET EVERY DAY 90 tablet 1   No current facility-administered medications for this visit.     OBJECTIVE: Older white woman in no acute distress  Vitals:   10/28/17 1305  BP: (!) 149/68  Pulse: 82  Resp: 18  Temp: 97.8 F (36.6 C)  SpO2: 96%     Body mass index is 25.3 kg/m.   Wt Readings from Last 3 Encounters:  10/28/17 147 lb 6.4 oz (66.9 kg)  09/13/17 147 lb 12.8 oz (67 kg)  09/02/17 147 lb 12.8 oz (67 kg)      ECOG FS:2 - Symptomatic, <50% confined to bed  Sclerae unicteric, patient is legally blind No cervical or supraclavicular adenopathy Lungs no rales or rhonchi Heart regular rate and rhythm Abd soft, nontender, positive bowel sounds MSK no focal spinal tenderness, no upper extremity lymphedema Neuro: nonfocal, well oriented, appropriate affect Breasts: Deferred  LAB RESULTS:  CMP     Component Value Date/Time   NA 138 08/10/2017 1021   K 4.3 08/10/2017 1021   CL 106 08/10/2017 1021   CO2 24 08/10/2017 1021   GLUCOSE 90 08/10/2017 1021   BUN 11 08/10/2017 1021   CREATININE 0.72 08/10/2017 1021   CREATININE 0.73 11/07/2013 1346   CALCIUM 9.3 08/10/2017 1021   PROT 6.3 (L) 04/02/2016 2042   ALBUMIN 3.6 04/02/2016 2042   AST 27 04/02/2016 2042   ALT 21 04/02/2016 2042   ALKPHOS 63 04/02/2016 2042   BILITOT 0.3 04/02/2016 2042   GFRNONAA >60 08/10/2017 1021   GFRAA >60 08/10/2017 1021    No results found for: TOTALPROTELP, ALBUMINELP, A1GS, A2GS, BETS, BETA2SER, GAMS, MSPIKE, SPEI  No results found for: KPAFRELGTCHN, LAMBDASER, KAPLAMBRATIO  Lab Results  Component Value Date   WBC 6.0 08/10/2017   NEUTROABS 3.5 11/26/2008   HGB 13.3 08/10/2017   HCT 40.8 08/10/2017   MCV 99.0 08/10/2017   PLT 192 08/10/2017    _0 @  No results found for: LABCA2  No components found for: [id number]  No results for input(s): INR in the last 168 hours.  No results found for: LABCA2  No results found for: TDD220  No results found  for: URK270  No results found for: WCB762  No results found for: CA2729  No components found for: HGQUANT  No results found for: CEA1 / No results found for: CEA1   No results found for: AFPTUMOR  No results found for: CHROMOGRNA  No results found for: PSA1  No visits with results within 3 Day(s) from this visit.  Latest known visit with results is:  Hospital Outpatient Visit on 08/10/2017  Component Date Value Ref Range Status  . Sodium 08/10/2017 138  135 - 145 mmol/L Final  . Potassium 08/10/2017 4.3  3.5 - 5.1 mmol/L Final  . Chloride 08/10/2017 106  101 - 111 mmol/L Final  . CO2 08/10/2017 24  22 - 32 mmol/L Final  . Glucose, Bld 08/10/2017 90  65 - 99 mg/dL Final  . BUN 08/10/2017 11  6 - 20 mg/dL Final  . Creatinine, Ser 08/10/2017 0.72  0.44 - 1.00 mg/dL Final  . Calcium 08/10/2017 9.3  8.9 - 10.3 mg/dL Final  . GFR calc non Af Amer 08/10/2017 >60  >60 mL/min Final  . GFR calc Af Amer 08/10/2017 >60  >60 mL/min Final   Comment: (NOTE) The eGFR has been calculated using the CKD EPI equation. This calculation has not been validated in all clinical situations. eGFR's persistently <60 mL/min signify possible Chronic Kidney Disease.   Georgiann Hahn gap 08/10/2017 8  5 - 15 Final   Performed at [facility], [place] [address], [place], [place] [address]  . WBC 08/10/2017 6.0  4.0 - 10.5 K/uL Final  . RBC 08/10/2017 4.12  3.87 - 5.11 MIL/uL  Final  . Hemoglobin 08/10/2017 13.3  12.0 - 15.0 g/dL Final  . HCT 08/10/2017 40.8  36.0 - 46.0 % Final  . MCV 08/10/2017 99.0  78.0 - 100.0 fL Final  . MCH 08/10/2017 32.3  26.0 - 34.0 pg Final  . MCHC 08/10/2017 32.6  30.0 - 36.0 g/dL Final  . RDW 08/10/2017 12.4  11.5 - 15.5 % Final  . Platelets 08/10/2017 192  150 - 400 K/uL Final   Performed at [facility], [place] [address], [place], [place] [address]    (this displays the last labs from the last 3 days)  No results found for: TOTALPROTELP, ALBUMINELP, A1GS, A2GS, BETS,  BETA2SER, GAMS, MSPIKE, SPEI (this displays SPEP labs)  No results found for: KPAFRELGTCHN, LAMBDASER, KAPLAMBRATIO (kappa/lambda light chains)  No results found for: HGBA, HGBA2QUANT, HGBFQUANT, HGBSQUAN (Hemoglobinopathy evaluation)   No results found for: LDH  No results found for: IRON, TIBC, IRONPCTSAT (Iron and TIBC)  No results found for: FERRITIN  Urinalysis    Component Value Date/Time   COLORURINE YELLOW 04/02/2016 2136   APPEARANCEUR HAZY (A) 04/02/2016 2136   LABSPEC 1.018 04/02/2016 2136   PHURINE 5.0 04/02/2016 2136   GLUCOSEU NEGATIVE 04/02/2016 2136   HGBUR NEGATIVE 04/02/2016 2136   BILIRUBINUR NEGATIVE 04/02/2016 2136   [place] NEGATIVE 04/02/2016 2136   PROTEINUR NEGATIVE 04/02/2016 2136   NITRITE NEGATIVE 04/02/2016 2136   LEUKOCYTESUR LARGE (A) 04/02/2016 2136     STUDIES: No results found.   ELIGIBLE FOR AVAILABLE RESEARCH PROTOCOL: Exact sciences study  ASSESSMENT: 84 y.o. [place], [place] woman status post left breast upper outer quadrant biopsy 07/06/2017 for a clinical T1c N0, stage IA invasive ductal carcinoma, grade 2, estrogen and progesterone receptor positive, HER-2 not amplified, with an MIB-1 of 12%  (1) status post left lumpectomy without sentinel lymph node sampling 08/23/2017 for a pT2 pNX, stage IIA invasive ductal carcinoma, grade 3, with a positive anterior margin (skin), and multiple close margins  (2) opted against adjuvant radiation   (3) anastrozole started 09/02/2017, discontinued 09/30/2017 secondary to intolerance  PLAN:  Chrystine was not able to tolerate the anastrozole.  She was already feeling poorly and this made it really impossible for her to do absolutely anything.  Within 4 or 5 days of stopping the medication she was already feeling much better and she is now back at her baseline which she says is not a great baseline.  Certainly we have other options that we could consider but right now but the patient really wanted  something to "give her some oomph".  There are several ways of doing this but I think the safest and simplest is for her to take a low-dose of methylphenidate, 5 mg with breakfast.  I think it will make her feel a little bit more energetic and a little bit sharper for at least the first half of the day.  It should not interfere with her sleep.  I wrote for 30 tablets and [name] will let me know if it is helpful in which case I would write for a larger dose.  Of course we are not able to give her any refills on this schedule II medication.  In any case Amaree will return to see me in October.  At that time we will consider whether we simply want to continue observation, which I think would be reasonable given her age and comorbidities, or consider fulvestrant.   Magrinat, Virgie Dad, MD  10/28/17 1:39 PM Medical Oncology  and Hematology [facility] [address] [place], [place] [address] Tel. [phone]    Fax. [phone]  I, Margit Banda am acting as a scribe for Chauncey Cruel, MD.   I, Lurline Del MD, have reviewed the above documentation for accuracy and completeness, and I agree with the above.

## 2017-11-20 ENCOUNTER — Other Ambulatory Visit: Payer: Self-pay | Admitting: Oncology

## 2017-11-21 ENCOUNTER — Other Ambulatory Visit: Payer: Self-pay | Admitting: Oncology

## 2017-11-21 DIAGNOSIS — Z170 Estrogen receptor positive status [ER+]: Principal | ICD-10-CM

## 2017-11-21 DIAGNOSIS — C50412 Malignant neoplasm of upper-outer quadrant of left female breast: Secondary | ICD-10-CM

## 2017-11-21 MED ORDER — LETROZOLE 2.5 MG PO TABS: 2.5000 mg | ORAL_TABLET | Freq: Every day | ORAL | 4 refills | Status: DC

## 2017-11-21 MED ORDER — METHYLPHENIDATE HCL 5 MG PO TABS: ORAL_TABLET | ORAL | 0 refills | Status: DC

## 2017-11-21 NOTE — Progress Notes (Unsigned)
*  Caution - External email - see footer for warnings* Gus,  I wanted to follow up as Mom is getting low on ridalin.  I'm thinking that you may be on vacation.  If I could get a 30 day supply, hopefully at increased dosage, called in to [facility], that would be great.  If that works, we could then do a 46 day supply thru [employer].  I feel it's really making a difference.  Thanks so much.  [name]

## 2017-11-28 ENCOUNTER — Ambulatory Visit: Payer: Medicare HMO | Admitting: Oncology

## 2018-02-02 ENCOUNTER — Inpatient Hospital Stay: Payer: Medicare HMO

## 2018-02-02 ENCOUNTER — Inpatient Hospital Stay: Payer: Medicare HMO | Admitting: Oncology

## 2018-02-07 ENCOUNTER — Other Ambulatory Visit: Payer: Self-pay | Admitting: Cardiology

## 2018-02-17 NOTE — Progress Notes (Signed)
[place]  Telephone:([phone]) [phone] Fax:(336) [phone]     ID: [name] DOB: 02/05/1930  MR#: [id number]  NUU#:[id number]  Patient Care Team: Drake Leach, MD as PCP - General (Internal Medicine) Leonie Man, MD as PCP - Cardiology (Cardiology) Ginger Leeth, Virgie Dad, MD as Consulting Physician (Oncology) Erroll Luna, MD as Consulting Physician (General Surgery) Leonie Man, MD as Consulting Physician (Cardiology) Juanita Craver, MD as Consulting Physician (Gastroenterology) Eppie Gibson, MD as Attending Physician (Radiation Oncology) OTHER MD:  CHIEF COMPLAINT: Estrogen receptor positive breast [name]  CURRENT TREATMENT: Observation   HISTORY OF CURRENT ILLNESS: From the original intake note:  [name] had an annual wellness check up on 06/22/2017. Her PCP palpated a possible mass in the left breast and recommended that she have a diagnostic mammogram. She underwent bilateral diagnostic mammography with tomography and left breast ultrasonography at [facility] on 07/05/2017 showing: breast density category C. There is an irregular asymmetry in the left breast the 5 o'clock position lower outer quadrant. Sonographically, there was a mass in the 2 o'clock upper outer position anterior depth of the left breast measuring 2 cm. No abnormalities were found in the left axilla.   Accordingly on 07/06/2017 she proceeded to biopsy of the left breast area in question. The pathology from this procedure showed ([id number]): Invasive ductal carcinoma, grade II.  Also ductal carcinoma in situ. Prognostic indicators significant for: estrogen receptor, 100% positive and progesterone receptor, 100% positive, both with strong staining intensity. Proliferation marker Ki67 at 12%. HER2 not amplified with ratios HER2/CEP17 signals 1.37 and average HER2 copies per cell 1.85  The patient's subsequent history is as detailed below.  INTERVAL HISTORY: [name] returns today for  follow-up of her estrogen receptor positive breast [name]. She is accompanied by her daughter [name].  After her last visit here, with the patient declining and complaining of multiple possible side effects, her letrozole was stopped.  To review the recent history, her most recent mammogram was 07/05/2017, showing a suspicious mass which was biopsied and led to left lumpectomy 08/23/2017, showing a 2.1 cm grade 3 invasive ductal carcinoma, with a positive anterior margin.  The patient was offered radiation but refused, and was started on anastrozole which as noted above she did not tolerate.  We had started her on methylphenidate briefly but she discontinued it as she did not feel it was helping.   REVIEW OF SYSTEMS: She spends time in her yard and excerise. She uses a stationary bicycle for 30 minutes. Her main complaint is constant left upper quadrant abdominal pain. The pain sometimes improves after eating.  However she was on omeprazole for a long time without relief, and is now taking Zantac.  She denies blood in stool or black stool. She takes Xanax as needed, and denies dizziness or falls. She states that her left breast hurts sometimes. The patient denies unusual headaches, visual changes, nausea, vomiting, or dizziness. There has been no unusual cough, phlegm production, or pleurisy. This been no change in bowel or bladder habits. The patient denies unexplained fatigue or unexplained weight loss, bleeding, rash, or fever. A detailed review of systems was otherwise noncontributory.   PAST MEDICAL HISTORY: Past Medical History:  Diagnosis Date  . Abdominal aortic aneurysm ([place])    asymptomatic, 2012 -  2.6 x 2.4cm  . Anxiety   . CAD S/P percutaneous coronary angioplasty    Cath 2010: 100% occlusion of RCA  (2 BMS stents finally occluded) w/ left to right collaterals;  moderate LAD and circumflex disease.  EF 55-60%;; cath 2015 with medical therapy recommended   . Chronic lower back pain   .  Daily headache       . Depression   . Dyspnea    on exertion sometimes  . Exertional shortness of breath   . GERD (gastroesophageal reflux disease)   . History of kidney stones   . History of stomach ulcers   . Hyperlipidemia   . Hypertension   . Hypothyroidism   . Macular degeneration    "started out dry; now is wet" (01/08/2013)  . Migraines       . Myocardial infarction ([place]) 2003  . PAD (peripheral artery disease) ([place]) 12/2005   s/p L Common Iliac Stent  . PONV (postoperative nausea and vomiting)   . Walking pneumonia 1970's  Legally blind  PAST SURGICAL HISTORY: Past Surgical History:  Procedure Laterality Date  . ABD AOTRA DOPPLER  08/14/2012   distal mild dilatation 2.6 x 2.5cm unchanged;right cia 70-99%  new finding  . Abdominal Aorta Duplex  09/2014   No change from prior study; 2.6 x 2.4 cm mild AAA. R Com Iliac A  > 50%. -- f/u 24 months.  . ABDOMINAL AORTAGRAM  01/08/2013   Procedure: ABDOMINAL Maxcine Ham;  Surgeon: Lorretta Harp, MD;  Location[facility] CATH LAB;  Service: Cardiovascular;;  . ANGIOPLASTY Right 01/08/2013   unsucess stenting RLE   . APPENDECTOMY    . BREAST LUMPECTOMY WITH RADIOACTIVE SEED LOCALIZATION Left 08/23/2017   Procedure: BREAST LUMPECTOMY WITH RADIOACTIVE SEED LOCALIZATION;  Surgeon: Erroll Luna, MD;  Location: [facility];  Service: General;  Laterality: Left;  . CARDIAC CATHETERIZATION  11/2008   diffuse ISR in  RCA w/ good L-R collaterals; AV groove Cx - 60-70% stenosis with post stenosis ectasia;  EF 55%, mild  CAD in LAD -- Med Rx  . CATARACT EXTRACTION W/ INTRAOCULAR LENS  IMPLANT, BILATERAL Bilateral   . CHOLECYSTECTOMY    . CORONARY ANGIOPLASTY  April 2006   Cutting Balloon PTCA of RCA ISR  . CORONARY ANGIOPLASTY WITH STENT PLACEMENT  Jan 2004   RCA - 2 overlapping Pixel BMS 2.0 mm x 18 mm & 2.0 mm x 23 m   . DILATION AND CURETTAGE OF UTERUS     "couple over the years" (01/08/2013)  . ILIAC ARTERY STENT Left 12/30/2005   PTA anddirect  stenting  of left common iliac artery - 8.0 x 18 mm OmniLink stent  . ILIAC ARTERY STENT Right 02/13/2013   successful diamondback orbital rotation lipectomy, PTA and stenting of highly calcified ostial common iliac artery/notes 02/13/2013  . LEFT HEART CATHETERIZATION WITH CORONARY ANGIOGRAM N/A 11/13/2013   Procedure: LEFT HEART CATHETERIZATION WITH CORONARY ANGIOGRAM;  Surgeon: Leonie Man, MD; LAD 40%, CFX 60%, OM1 60%, RCA diffuse in-stent restenosis up to 99%, small PDA and PLA, collaterals from left system noted, EF 55-60 percent  . LOWER EXTREMITY ANGIOGRAM N/A 01/08/2013   Procedure: LOWER EXTREMITY ANGIOGRAM;  Surgeon: Lorretta Harp, MD;  Location: [facility] CATH LAB;  Service: Cardiovascular;  Laterality: N/A;  . NM MYOCAR PERF WALL MOTION  12/27/2011 -[place]   LV normal,EF 81 %  . NM MYOCAR PERF WALL MOTION  01/2015   EF 56%. Normal ROM of low risk. Small defect of mild severity and apical anterior location likely related to breast attenuation. Cannot rule out small area of mild ischemia.  . TRANSTHORACIC ECHOCARDIOGRAM  10/2013   Normal LV size low normal function.  EF 50-55%. No regional wall motion abnormality. GR 1 DD. PAP ~ 47 mmHg    FAMILY HISTORY No family history on file.  The patient's father died at age 62 due to diabetes and heart issues. The patient's mother died at age 7 due to old age. The patient has 6 brothers and 2 sisters. She denies a family history of breast or ovarian [name].   GYNECOLOGIC HISTORY:  No LMP recorded. Patient is postmenopausal. Menarche: 84 years old Age at first live birth: 84 years old She is GXP4.  Her LMP was at age 46.  She never used contraceptives. 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 She is widowed and lives by herself, with no pets. Daughter, [name], lives in [place] and is retired. Son, Juanda Crumble, lives in [place] and is a Administrator. Son, Coralyn Mark,  lives in [place] and is a Freight forwarder. The patient's son, Cecilie Lowers died only a few months ago from a sudden intracranial bleed. The patient has 6 grandchildren and 1 great-grandchild. She attends [employer] in [place].     ADVANCED DIRECTIVES: Suzi Roots (her daughter) is healthcare power of attorney   HEALTH MAINTENANCE: Social History   Tobacco Use  . Smoking status: Former Smoker    Packs/day: 1.00    Years: 40.00    Pack years: 40.00    Types: Cigarettes    Last attempt to quit: 09/18/1987    Years since quitting: 30.4  . Smokeless tobacco: Never Used  Substance Use Topics  . Alcohol use: No  . Drug use: No     Colonoscopy: 2009 under Dr. Collene Mares  PAP: 10+ years ago  Bone density: 09/06/2017 T score -4.0   Allergies  Allergen Reactions  . Avastin [Bevacizumab] Other (See Comments)    Muscle pain  . Benadryl [Diphenhydramine] Palpitations and Other (See Comments)    TACHYCARDIA  . Hydrocodone-Acetaminophen Nausea And Vomiting and Other (See Comments)    Delirium  . Levofloxacin Other (See Comments)    Myalgias  . Statins Other (See Comments)    Myalgias  . Trazodone Other (See Comments)    UNSPECIFIED REACTION "TERRIBLE SIDE EFFECTS"  . Beta Adrenergic Blockers Other (See Comments)    fatigue    Current Outpatient Medications  Medication Sig Dispense Refill  . ALPRAZolam (XANAX) 0.5 MG tablet Take 0.5 mg by mouth daily as needed for anxiety.    [name] amLODipine (NORVASC) 2.5 MG tablet TAKE 1 TABLET EVERY DAY NEED MD APPOINTMENT FOR REFILLS 180 tablet 1  . Cholecalciferol (VITAMIN D-3) 5000 units TABS Take 5,000 Units by mouth daily.    [name] levothyroxine (SYNTHROID, LEVOTHROID) 100 MCG tablet Take 100 mcg by mouth daily.    . nitroGLYCERIN (NITROSTAT) 0.4 MG SL tablet Place 1 tablet (0.4 mg total) under the tongue every 5 (five) minutes as needed for chest pain. 25 tablet 4  . Omega-3 Fatty Acids (FISH OIL) 1000 MG CAPS Take 1,000 mg by mouth daily.     Vladimir Faster Glycol-Propyl Glycol (LUBRICANT EYE DROPS) 0.4-0.3 % SOLN Place 1 drop into both eyes daily.    . ranitidine (ZANTAC) 150 MG tablet Take 150 mg by mouth 2 (two) times daily as needed (FOR HEARTBURN/INDIGESTION/ACID REFLUX.).     [name] sertraline (ZOLOFT) 100 MG tablet Take 100 mg by mouth every evening.    . trandolapril (MAVIK) 2 MG tablet TAKE 1 TABLET EVERY  DAY 90 tablet 1   No current facility-administered medications for this visit.     OBJECTIVE: Older white woman who appears stated age  84:   02/21/18 1140  BP: (!) 152/72  Pulse: 70  Resp: 18  Temp: (!) 97.3 F (36.3 C)  SpO2: 97%     Body mass index is 25.03 kg/m.   Wt Readings from Last 3 Encounters:  02/21/18 145 lb 12.8 oz (66.1 kg)  10/28/17 147 lb 6.4 oz (66.9 kg)  09/13/17 147 lb 12.8 oz (67 kg)      ECOG FS:2 - Symptomatic, <50% confined to bed  Sclerae unicteric, EOMs intact No cervical or supraclavicular adenopathy Lungs no rales or rhonchi Heart regular rate and rhythm Abd soft, mild tenderness to moderate palpation in the left upper quadrant, no masses palpated, positive bowel sounds MSK no focal spinal tenderness, no upper extremity lymphedema Neuro: nonfocal, well oriented, appropriate affect Breasts: The right breast is unremarkable.  The left breast is status post lumpectomy.  The cosmetic result is good.  There is no evidence of local recurrence.  Both axillae are benign   LAB RESULTS:  CMP     Component Value Date/Time   NA 143 02/21/2018 1123   K 4.7 02/21/2018 1123   CL 109 02/21/2018 1123   CO2 28 02/21/2018 1123   GLUCOSE 104 (H) 02/21/2018 1123   BUN 16 02/21/2018 1123   CREATININE 0.81 02/21/2018 1123   CREATININE 0.73 11/07/2013 1346   CALCIUM 9.5 02/21/2018 1123   PROT 6.7 02/21/2018 1123   ALBUMIN 3.8 02/21/2018 1123   AST 16 02/21/2018 1123   ALT 12 02/21/2018 1123   ALKPHOS 85 02/21/2018 1123   BILITOT 0.4 02/21/2018 1123   GFRNONAA >60 02/21/2018 1123   GFRAA  >60 02/21/2018 1123    No results found for: TOTALPROTELP, ALBUMINELP, A1GS, A2GS, BETS, BETA2SER, GAMS, MSPIKE, SPEI  No results found for: KPAFRELGTCHN, LAMBDASER, KAPLAMBRATIO  Lab Results  Component Value Date   WBC 5.6 02/21/2018   NEUTROABS 3.2 02/21/2018   HGB 13.0 02/21/2018   HCT 40.3 02/21/2018   MCV 100.8 (H) 02/21/2018   PLT 154 02/21/2018    @LASTCHEMISTRY @  No results found for: LABCA2  No components found for: [id number]  No results for input(s): INR in the last 168 hours.  No results found for: LABCA2  No results found for: WJX914  No results found for: NWG956  No results found for: OZH086  No results found for: CA2729  No components found for: HGQUANT  No results found for: CEA1 / No results found for: CEA1   No results found for: AFPTUMOR  No results found for: [place]  No results found for: PSA1  Appointment on 02/21/2018  Component Date Value Ref Range Status  . Sodium 02/21/2018 143  135 - 145 mmol/L Final  . Potassium 02/21/2018 4.7  3.5 - 5.1 mmol/L Final  . Chloride 02/21/2018 109  98 - 111 mmol/L Final  . CO2 02/21/2018 28  22 - 32 mmol/L Final  . Glucose, Bld 02/21/2018 104* 70 - 99 mg/dL Final  . BUN 02/21/2018 16  8 - 23 mg/dL Final  . Creatinine 02/21/2018 0.81  0.44 - 1.00 mg/dL Final  . Calcium 02/21/2018 9.5  8.9 - 10.3 mg/dL Final  . Total Protein 02/21/2018 6.7  6.5 - 8.1 g/dL Final  . Albumin 02/21/2018 3.8  3.5 - 5.0 g/dL Final  . AST 02/21/2018 16  15 - 41 U/L Final  .  ALT 02/21/2018 12  0 - 44 U/L Final  . Alkaline Phosphatase 02/21/2018 85  38 - 126 U/L Final  . Total Bilirubin 02/21/2018 0.4  0.3 - 1.2 mg/dL Final  . GFR, Est Non Af Am 02/21/2018 >60  >60 mL/min Final  . GFR, Est AFR Am 02/21/2018 >60  >60 mL/min Final   Comment: (NOTE) The eGFR has been calculated using the CKD EPI equation. This calculation has not been validated in all clinical situations. eGFR's persistently <60 mL/min signify possible  Chronic Kidney Disease.   [name] 02/21/2018 6  5 - 15 Final   Performed at [facility], [place] [address] [place], [place] [address]  . WBC Count 02/21/2018 5.6  4.0 - 10.5 K/uL Final  . RBC 02/21/2018 4.00  3.87 - 5.11 MIL/uL Final  . Hemoglobin 02/21/2018 13.0  12.0 - 15.0 g/dL Final  . HCT 02/21/2018 40.3  36.0 - 46.0 % Final  . MCV 02/21/2018 100.8* 80.0 - 100.0 fL Final  . MCH 02/21/2018 32.5  26.0 - 34.0 pg Final  . MCHC 02/21/2018 32.3  30.0 - 36.0 g/dL Final  . RDW 02/21/2018 12.4  11.5 - 15.5 % Final  . Platelet Count 02/21/2018 154  150 - 400 K/uL Final  . nRBC 02/21/2018 0.0  0.0 - 0.2 % Final  . Neutrophils Relative % 02/21/2018 57  % Final  . Neutro Abs 02/21/2018 3.2  1.7 - 7.7 K/uL Final  . Lymphocytes Relative 02/21/2018 30  % Final  . Lymphs Abs 02/21/2018 1.7  0.7 - 4.0 K/uL Final  . Monocytes Relative 02/21/2018 10  % Final  . Monocytes Absolute 02/21/2018 0.5  0.1 - 1.0 K/uL Final  . Eosinophils Relative 02/21/2018 2  % Final  . Eosinophils Absolute 02/21/2018 0.1  0.0 - 0.5 K/uL Final  . Basophils Relative 02/21/2018 1  % Final  . Basophils Absolute 02/21/2018 0.0  0.0 - 0.1 K/uL Final  . Immature Granulocytes 02/21/2018 0  % Final  . Abs Immature Granulocytes 02/21/2018 0.01  0.00 - 0.07 K/uL Final   Performed at [facility], [place] [address] [place], [place] [address]    (this displays the last labs from the last 3 days)  No results found for: TOTALPROTELP, ALBUMINELP, A1GS, A2GS, BETS, BETA2SER, GAMS, MSPIKE, SPEI (this displays SPEP labs)  No results found for: KPAFRELGTCHN, LAMBDASER, KAPLAMBRATIO (kappa/lambda light chains)  No results found for: HGBA, HGBA2QUANT, HGBFQUANT, HGBSQUAN (Hemoglobinopathy evaluation)   No results found for: LDH  No results found for: IRON, TIBC, IRONPCTSAT (Iron and TIBC)  No results found for: FERRITIN  Urinalysis    Component Value Date/Time   COLORURINE  YELLOW 04/02/2016 2136   APPEARANCEUR HAZY (A) 04/02/2016 2136   LABSPEC 1.018 04/02/2016 2136   PHURINE 5.0 04/02/2016 2136   GLUCOSEU NEGATIVE 04/02/2016 2136   HGBUR NEGATIVE 04/02/2016 2136   BILIRUBINUR NEGATIVE 04/02/2016 2136   [place] NEGATIVE 04/02/2016 2136   PROTEINUR NEGATIVE 04/02/2016 2136   NITRITE NEGATIVE 04/02/2016 2136   LEUKOCYTESUR LARGE (A) 04/02/2016 2136     STUDIES: No results found.   ELIGIBLE FOR AVAILABLE RESEARCH PROTOCOL: Exact sciences study  ASSESSMENT: 84 y.o. [place] status post left breast upper outer quadrant biopsy 07/06/2017 for a clinical T1c N0, stage IA invasive ductal carcinoma, grade 2, estrogen and progesterone receptor positive, HER-2 not amplified, with an MIB-1 of 12%  (1) status post left lumpectomy without sentinel lymph node sampling 08/23/2017  for a pT2 pNX, stage IIA invasive ductal carcinoma, grade 3, with a positive anterior margin (skin), and multiple close margins  (2) opted against adjuvant radiation   (3) letrozole started 09/02/2017, discontinued 09/30/2017 secondary to intolerance  PLAN:  [name] is now approximately 6 months out from definitive surgery for her breast [name] with no evidence of disease recurrence.  Her risk of local recurrence is high as she did not receive radiation and there was a positive anterior margin.  However she was not able to tolerate aromatase inhibitors.  Given her age and comorbidities we have decided on observation alone at least for now.  I do not have a simple explanation for the left upper abdomen to man discomfort, which is fairly constant at this point.  There was some tenderness to palpation but no mass.  There is perhaps some relief when she eats but she has been on omeprazole for quite some time without resolution.  I do think this needs to be looked at and so I am setting her up for CT of the abdomen and pelvis.  Assuming all is well she will see me again in April and she  will have her next mammogram in March  She knows to call for any other issues that may develop before the next visit here.   Cedar Roseman, Virgie Dad, MD  02/21/18 12:08 PM Medical Oncology and Hematology [facility] [address] [place], [place] [address] Tel. [phone]    Fax. [phone]  Lendon Ka, am acting as a Education administrator for Chauncey Cruel, MD.   I, Lurline Del MD, have reviewed the above documentation for accuracy and completeness, and I agree with the above.

## 2018-02-21 ENCOUNTER — Inpatient Hospital Stay: Payer: Medicare HMO | Admitting: Oncology

## 2018-02-21 ENCOUNTER — Inpatient Hospital Stay: Payer: Medicare HMO | Attending: Oncology

## 2018-02-21 ENCOUNTER — Telehealth: Payer: Self-pay | Admitting: Oncology

## 2018-02-21 ENCOUNTER — Other Ambulatory Visit: Payer: Self-pay | Admitting: *Deleted

## 2018-02-21 VITALS — BP 152/72 | HR 70 | Temp 97.3°F | Resp 18 | Ht 64.0 in | Wt 145.8 lb

## 2018-02-21 DIAGNOSIS — C50412 Malignant neoplasm of upper-outer quadrant of left female breast: Secondary | ICD-10-CM

## 2018-02-21 DIAGNOSIS — R109 Unspecified abdominal pain: Secondary | ICD-10-CM | POA: Insufficient documentation

## 2018-02-21 DIAGNOSIS — Z79899 Other long term (current) drug therapy: Secondary | ICD-10-CM

## 2018-02-21 DIAGNOSIS — C50512 Malignant neoplasm of lower-outer quadrant of left female breast: Secondary | ICD-10-CM | POA: Insufficient documentation

## 2018-02-21 DIAGNOSIS — I10 Essential (primary) hypertension: Secondary | ICD-10-CM | POA: Insufficient documentation

## 2018-02-21 DIAGNOSIS — Z87891 Personal history of nicotine dependence: Secondary | ICD-10-CM

## 2018-02-21 DIAGNOSIS — Z170 Estrogen receptor positive status [ER+]: Secondary | ICD-10-CM | POA: Insufficient documentation

## 2018-02-21 DIAGNOSIS — I739 Peripheral vascular disease, unspecified: Secondary | ICD-10-CM

## 2018-02-21 DIAGNOSIS — R1012 Left upper quadrant pain: Secondary | ICD-10-CM

## 2018-02-21 LAB — CMP (CANCER CENTER ONLY)
ALBUMIN: 3.8 g/dL (ref 3.5–5.0)
ALK PHOS: 85 U/L (ref 38–126)
ALT: 12 U/L (ref 0–44)
AST: 16 U/L (ref 15–41)
Anion gap: 6 (ref 5–15)
BILIRUBIN TOTAL: 0.4 mg/dL (ref 0.3–1.2)
BUN: 16 mg/dL (ref 8–23)
CALCIUM: 9.5 mg/dL (ref 8.9–10.3)
CO2: 28 mmol/L (ref 22–32)
Chloride: 109 mmol/L (ref 98–111)
Creatinine: 0.81 mg/dL (ref 0.44–1.00)
GFR, Est AFR Am: >60 mL/min (ref >60)
GLUCOSE: 104 mg/dL — AB (ref 70–99)
POTASSIUM: 4.7 mmol/L (ref 3.5–5.1)
Sodium: 143 mmol/L (ref 135–145)
TOTAL PROTEIN: 6.7 g/dL (ref 6.5–8.1)

## 2018-02-21 LAB — CBC WITH DIFFERENTIAL (CANCER CENTER ONLY)
Abs Immature Granulocytes: 0.01 10*3/uL (ref 0.00–0.07)
BASOS PCT: 1 %
Basophils Absolute: 0 10*3/uL (ref 0.0–0.1)
EOS ABS: 0.1 10*3/uL (ref 0.0–0.5)
EOS PCT: 2 %
HCT: 40.3 % (ref 36.0–46.0)
Hemoglobin: 13 g/dL (ref 12.0–15.0)
Immature Granulocytes: 0 %
Lymphocytes Relative: 30 %
Lymphs Abs: 1.7 10*3/uL (ref 0.7–4.0)
MCH: 32.5 pg (ref 26.0–34.0)
MCHC: 32.3 g/dL (ref 30.0–36.0)
MCV: 100.8 fL — ABNORMAL HIGH (ref 80.0–100.0)
MONO ABS: 0.5 10*3/uL (ref 0.1–1.0)
MONOS PCT: 10 %
NEUTROS ABS: 3.2 10*3/uL (ref 1.7–7.7)
Neutrophils Relative %: 57 %
PLATELETS: 154 10*3/uL (ref 150–400)
RBC: 4 MIL/uL (ref 3.87–5.11)
RDW: 12.4 % (ref 11.5–15.5)
WBC: 5.6 10*3/uL (ref 4.0–10.5)
nRBC: 0 % (ref 0.0–0.2)

## 2018-02-21 NOTE — Telephone Encounter (Signed)
Gave patient avs, calendar and contrast for CT.  [facility] will call patient to scheduled mammogram.

## 2018-02-27 ENCOUNTER — Encounter (HOSPITAL_COMMUNITY): Payer: Self-pay | Admitting: Radiology

## 2018-02-27 ENCOUNTER — Ambulatory Visit (HOSPITAL_COMMUNITY)
Admission: RE | Admit: 2018-02-27 | Discharge: 2018-02-27 | Disposition: A | Discharge status: Home / Self Care | Payer: Medicare HMO | Source: Ambulatory Visit | Attending: Oncology | Admitting: Oncology

## 2018-02-27 DIAGNOSIS — R1012 Left upper quadrant pain: Secondary | ICD-10-CM | POA: Insufficient documentation

## 2018-02-27 DIAGNOSIS — K5730 Diverticulosis of large intestine without perforation or abscess without bleeding: Secondary | ICD-10-CM | POA: Insufficient documentation

## 2018-02-27 DIAGNOSIS — C50412 Malignant neoplasm of upper-outer quadrant of left female breast: Secondary | ICD-10-CM | POA: Diagnosis present

## 2018-02-27 DIAGNOSIS — I714 Abdominal aortic aneurysm, without rupture: Secondary | ICD-10-CM | POA: Insufficient documentation

## 2018-02-27 DIAGNOSIS — I517 Cardiomegaly: Secondary | ICD-10-CM | POA: Diagnosis not present

## 2018-02-27 DIAGNOSIS — I2510 Atherosclerotic heart disease of native coronary artery without angina pectoris: Secondary | ICD-10-CM | POA: Diagnosis not present

## 2018-02-27 DIAGNOSIS — Z170 Estrogen receptor positive status [ER+]: Secondary | ICD-10-CM | POA: Insufficient documentation

## 2018-02-27 DIAGNOSIS — M47816 Spondylosis without myelopathy or radiculopathy, lumbar region: Secondary | ICD-10-CM | POA: Insufficient documentation

## 2018-02-27 DIAGNOSIS — N200 Calculus of kidney: Secondary | ICD-10-CM | POA: Diagnosis not present

## 2018-02-27 DIAGNOSIS — I700 Atherosclerosis of aorta: Secondary | ICD-10-CM | POA: Diagnosis not present

## 2018-02-27 DIAGNOSIS — N281 Cyst of kidney, acquired: Secondary | ICD-10-CM | POA: Insufficient documentation

## 2018-02-27 MED ORDER — SODIUM CHLORIDE (PF) 0.9 % IJ SOLN
INTRAMUSCULAR | Status: AC
  Filled 2018-02-27: qty 50

## 2018-02-27 MED ORDER — IOHEXOL 300 MG/ML  SOLN
100.0000 mL | Freq: Once | INTRAMUSCULAR | Status: AC | PRN
  Administered 2018-02-27: 100 mL via INTRAVENOUS

## 2018-03-09 ENCOUNTER — Inpatient Hospital Stay: Payer: Medicare HMO | Admitting: Adult Health

## 2018-03-09 NOTE — Progress Notes (Deleted)
CLINIC:  Survivorship   REASON FOR VISIT:  Routine follow-up post-treatment for a recent history of breast cancer.  BRIEF ONCOLOGIC HISTORY:    Malignant neoplasm of upper-outer quadrant of left breast in female, estrogen receptor positive ([place])   07/06/2017 Initial Diagnosis    status post left breast upper outer quadrant biopsy 07/06/2017 for a clinical T1c N0, stage IA invasive ductal carcinoma, grade 2, estrogen and progesterone receptor positive, HER-2 not amplified, with an MIB-1 of 12%    08/23/2017 Surgery    status post left lumpectomy without sentinel lymph node sampling for a pT2 pNX, stage IIA invasive ductal carcinoma, grade 3, with a positive anterior margin (skin), and multiple close margins    09/02/2017 - 09/30/2017 Anti-estrogen oral therapy    letrozole started 09/02/2017, discontinued 09/30/2017 secondary to intolerance     INTERVAL HISTORY:  [name] presents to the [facility] today for our initial meeting to review her survivorship care plan detailing her treatment course for breast cancer, as well as monitoring long-term side effects of that treatment, education regarding health maintenance, screening, and overall wellness and health promotion.     Overall, [name] reports feeling quite well since completing her radiation therapy approximately 3 months ago.  She ***    REVIEW OF SYSTEMS:  Review of Systems - Oncology Breast: Denies any new nodularity, masses, tenderness, nipple changes, or nipple discharge.      ONCOLOGY TREATMENT TEAM:  1. Surgeon:  Dr. [name] at [facility] 2. Medical Oncologist: Dr. [name]  3. Radiation Oncologist: Dr. [name]    PAST MEDICAL/SURGICAL HISTORY:  Past Medical History:  Diagnosis Date  . Abdominal aortic aneurysm ([place])    asymptomatic, 2012 -  2.6 x 2.4cm  . Anxiety   . CAD S/P percutaneous coronary angioplasty    Cath 2010: 100% occlusion of RCA  (2 BMS stents finally occluded) w/ left to right  collaterals; moderate LAD and circumflex disease.  EF 55-60%;; cath 2015 with medical therapy recommended   . Chronic lower back pain   . Daily headache       . Depression   . Dyspnea    on exertion sometimes  . Exertional shortness of breath   . GERD (gastroesophageal reflux disease)   . History of kidney stones   . History of stomach ulcers   . Hyperlipidemia   . Hypertension   . Hypothyroidism   . Macular degeneration    "started out dry; now is wet" (01/08/2013)  . Migraines       . Myocardial infarction ([place]) 2003  . PAD (peripheral artery disease) ([place]) 12/2005   s/p L Common Iliac Stent  . PONV (postoperative nausea and vomiting)   . Walking pneumonia 1970's   Past Surgical History:  Procedure Laterality Date  . ABD AOTRA DOPPLER  08/14/2012   distal mild dilatation 2.6 x 2.5cm unchanged;right cia 70-99%  new finding  . Abdominal Aorta Duplex  09/2014   No change from prior study; 2.6 x 2.4 cm mild AAA. R Com Iliac A  > 50%. -- f/u 24 months.  . ABDOMINAL AORTAGRAM  01/08/2013   Procedure: ABDOMINAL Maxcine Ham;  Surgeon: Lorretta Harp, MD;  Location: [facility] CATH LAB;  Service: Cardiovascular;;  . ANGIOPLASTY Right 01/08/2013   unsucess stenting RLE   . APPENDECTOMY    . BREAST LUMPECTOMY WITH RADIOACTIVE SEED LOCALIZATION Left 08/23/2017   Procedure: BREAST LUMPECTOMY WITH RADIOACTIVE SEED LOCALIZATION;  Surgeon: Erroll Luna, MD;  Location: [facility]  OR;  Service: General;  Laterality: Left;  . CARDIAC CATHETERIZATION  11/2008   diffuse ISR in  RCA w/ good L-R collaterals; AV groove Cx - 60-70% stenosis with post stenosis ectasia;  EF 55%, mild  CAD in LAD -- Med Rx  . CATARACT EXTRACTION W/ INTRAOCULAR LENS  IMPLANT, BILATERAL Bilateral   . CHOLECYSTECTOMY    . CORONARY ANGIOPLASTY  April 2006   Cutting Balloon PTCA of RCA ISR  . CORONARY ANGIOPLASTY WITH STENT PLACEMENT  Jan 2004   RCA - 2 overlapping Pixel BMS 2.0 mm x 18 mm & 2.0 mm x 23 m   . DILATION AND CURETTAGE OF  UTERUS     "couple over the years" (01/08/2013)  . ILIAC ARTERY STENT Left 12/30/2005   PTA anddirect stenting  of left common iliac artery - 8.0 x 18 mm OmniLink stent  . ILIAC ARTERY STENT Right 02/13/2013   successful diamondback orbital rotation lipectomy, PTA and stenting of highly calcified ostial common iliac artery/notes 02/13/2013  . LEFT HEART CATHETERIZATION WITH CORONARY ANGIOGRAM N/A 11/13/2013   Procedure: LEFT HEART CATHETERIZATION WITH CORONARY ANGIOGRAM;  Surgeon: Leonie Man, MD; LAD 40%, CFX 60%, OM1 60%, RCA diffuse in-stent restenosis up to 99%, small PDA and PLA, collaterals from left system noted, EF 55-60 percent  . LOWER EXTREMITY ANGIOGRAM N/A 01/08/2013   Procedure: LOWER EXTREMITY ANGIOGRAM;  Surgeon: Lorretta Harp, MD;  Location: [facility] CATH LAB;  Service: Cardiovascular;  Laterality: N/A;  . NM MYOCAR PERF WALL MOTION  12/27/2011 -[place]   LV normal,EF 81 %  . NM MYOCAR PERF WALL MOTION  01/2015   EF 56%. Normal ROM of low risk. Small defect of mild severity and apical anterior location likely related to breast attenuation. Cannot rule out small area of mild ischemia.  . TRANSTHORACIC ECHOCARDIOGRAM  10/2013   Normal LV size low normal function. EF 50-55%. No regional wall motion abnormality. GR 1 DD. PAP ~ 47 mmHg     ALLERGIES:  Allergies  Allergen Reactions  . Avastin [Bevacizumab] Other (See Comments)    Muscle pain  . Benadryl [Diphenhydramine] Palpitations and Other (See Comments)    TACHYCARDIA  . Hydrocodone-Acetaminophen Nausea And Vomiting and Other (See Comments)    Delirium  . Levofloxacin Other (See Comments)    Myalgias  . Statins Other (See Comments)    Myalgias  . Trazodone Other (See Comments)    UNSPECIFIED REACTION "TERRIBLE SIDE EFFECTS"  . Beta Adrenergic Blockers Other (See Comments)    fatigue     CURRENT MEDICATIONS:  Outpatient Encounter Medications as of 03/09/2018  Medication Sig  . ALPRAZolam (XANAX) 0.5 MG  tablet Take 0.5 mg by mouth daily as needed for anxiety.  [name] amLODipine (NORVASC) 2.5 MG tablet TAKE 1 TABLET EVERY DAY NEED MD APPOINTMENT FOR REFILLS  . Cholecalciferol (VITAMIN D-3) 5000 units TABS Take 5,000 Units by mouth daily.  [name] levothyroxine (SYNTHROID, LEVOTHROID) 100 MCG tablet Take 100 mcg by mouth daily.  . nitroGLYCERIN (NITROSTAT) 0.4 MG SL tablet Place 1 tablet (0.4 mg total) under the tongue every 5 (five) minutes as needed for chest pain.  . Omega-3 Fatty Acids (FISH OIL) 1000 MG CAPS Take 1,000 mg by mouth daily.   Terry Faster Glycol-Propyl Glycol (LUBRICANT EYE DROPS) 0.4-0.3 % SOLN Place 1 drop into both eyes daily.  . ranitidine (ZANTAC) 150 MG tablet Take 150 mg by mouth 2 (two) times daily as needed (FOR HEARTBURN/INDIGESTION/ACID REFLUX.).   [name] sertraline (ZOLOFT)  100 MG tablet Take 100 mg by mouth every evening.  . trandolapril ([place]) 2 MG tablet TAKE 1 TABLET EVERY DAY   No facility-administered encounter medications on file as of 03/09/2018.      ONCOLOGIC FAMILY HISTORY:  No family history on file.   GENETIC COUNSELING/TESTING: ***  SOCIAL HISTORY:  [name] is /single/married/divorced/widowed/separated and lives alone/with her spouse/family/friend in (city), [place].  She has (#) children and they live in (city).  Ms. Chesterfield is currently retired/disabled/working part-time/full-time as ***.  She denies any current or history of tobacco, alcohol, or illicit drug use.     PHYSICAL EXAMINATION:  Vital Signs:  There were no vitals filed for this visit. There were no vitals filed for this visit. General: Well-nourished, well-appearing female in no acute distress.  She is unaccompanied/accompanied in clinic by her ***** today.   HEENT: Head is normocephalic.  Pupils equal and reactive to light. Conjunctivae clear without exudate.  Sclerae anicteric. Oral mucosa is pink, moist.  Oropharynx is pink without lesions or erythema.  Lymph: No cervical,  supraclavicular, or infraclavicular lymphadenopathy noted on palpation.  Cardiovascular: Regular rate and rhythm.[name] Respiratory: Clear to auscultation bilaterally. Chest expansion symmetric; breathing non-labored.  GI: Abdomen soft and round; non-tender, non-distended. Bowel sounds normoactive.  GU: Deferred.  Neuro: No focal deficits. Steady gait.  Psych: Mood and affect normal and appropriate for situation.  Extremities: No edema. MSK: No focal spinal tenderness to palpation.  Full range of motion in bilateral upper extremities Skin: Warm and dry.  LABORATORY DATA:  None for this visit.  DIAGNOSTIC IMAGING:  None for this visit.      ASSESSMENT AND PLAN:  [name] is a pleasant 84 y.o. female with Stage IB left breast invasive ductal carcinoma, ER+/PR+/HER2-, diagnosed in 06/2017, treated with lumpectomy,  and anti-estrogen therapy with Letrozole x 1 month (unable to tolerate).  She presents to the Survivorship Clinic for our initial meeting and routine follow-up post-completion of treatment for breast cancer.    1. Stage IB left breast cancer:  [name] is continuing to recover from definitive treatment for breast cancer. She will follow-up with her medical oncologist, Dr. Ross Ludwig in (month) /2017 with history and physical exam per surveillance protocol.  She will continue her anti-estrogen therapy with (drug). Thus far, she is tolerating the *** well, with minimal side effects. She was instructed to make Dr. Lindi [name] or myself aware if she begins to experience any worsening side effects of the medication and I could see her back in clinic to help manage those side effects, as needed. Though the incidence is low, there is an associated risk of endometrial cancer with anti-estrogen therapies like Tamoxifen.  [name] was encouraged to contact Dr. Carrington Clamp or myself with any vaginal bleeding while taking Tamoxifen. Other side effects of Tamoxifen were again reviewed  with her as well. Today, a comprehensive survivorship care plan and treatment summary was reviewed with the patient today detailing her breast cancer diagnosis, treatment course, potential late/long-term effects of treatment, appropriate follow-up care with recommendations for the future, and patient education resources.  A copy of this summary, along with a letter will be sent to the patient's primary care provider via mail/fax/In Basket message after today's visit.    #. Problem(s) at Visit______________  #. Bone health:  Given [name] age/history of breast cancer and her current treatment regimen including anti-estrogen therapy with _______, she is at risk for bone demineralization.  Her last DEXA scan was **/**/  20**, which showed (results).***  In the meantime, she was encouraged to increase her consumption of foods rich in calcium, as well as increase her weight-bearing activities.  She was given education on specific activities to promote bone health.  #. Cancer screening:  Due to [name]'s history and her age, she should receive screening for skin cancers, colon cancer, and gynecologic cancers.  The information and recommendations are listed on the patient's comprehensive care plan/treatment summary and were reviewed in detail with the patient.    #. Health maintenance and wellness promotion: [name] was encouraged to consume 5-7 servings of fruits and vegetables per day. We reviewed the "Nutrition Rainbow" handout, as well as the handout "Take Control of Your Health and Reduce Your Cancer Risk" from the [employer].  She was also encouraged to engage in moderate to vigorous exercise for 30 minutes per day most days of the week. We discussed the [employer] fitness program, which is designed for cancer survivors to help them become more physically fit after cancer treatments.  She was instructed to limit her alcohol consumption and continue to abstain from tobacco use/***was  encouraged stop smoking.     #. Support services/counseling: It is not uncommon for this period of the patient's cancer care trajectory to be one of many emotions and stressors.  We discussed an opportunity for her to participate in the next session of [facility] ("Finding Your New Normal") support group series designed for patients after they have completed treatment.   [name] was encouraged to take advantage of our many other support services programs, support groups, and/or counseling in coping with her new life as a cancer survivor after completing anti-cancer treatment.  She was offered support today through active listening and expressive supportive counseling.  She was given information regarding our available services and encouraged to contact me with any questions or for help enrolling in any of our support group/programs.    Dispo:   -Return to cancer center ***  -Mammogram due in *** -Follow up with surgery *** -She is welcome to return back to the Survivorship Clinic at any time; no additional follow-up needed at this time.  -Consider referral back to survivorship as a long-term survivor for continued surveillance  A total of (30) minutes of face-to-face time was spent with this patient with greater than 50% of that time in counseling and care-coordination.   Gardenia Phlegm, [place] [phone]   Note: PRIMARY CARE PROVIDER Drake Leach, [place] [phone]

## 2018-04-09 ENCOUNTER — Emergency Department (HOSPITAL_BASED_OUTPATIENT_CLINIC_OR_DEPARTMENT_OTHER): Payer: Medicare HMO

## 2018-04-09 ENCOUNTER — Other Ambulatory Visit: Payer: Self-pay

## 2018-04-09 ENCOUNTER — Encounter (HOSPITAL_BASED_OUTPATIENT_CLINIC_OR_DEPARTMENT_OTHER): Payer: Self-pay | Admitting: Emergency Medicine

## 2018-04-09 ENCOUNTER — Emergency Department (HOSPITAL_BASED_OUTPATIENT_CLINIC_OR_DEPARTMENT_OTHER)
Admission: EM | Admit: 2018-04-09 | Discharge: 2018-04-09 | Disposition: A | Discharge status: Home / Self Care | Payer: Medicare HMO | Attending: Emergency Medicine | Admitting: Emergency Medicine

## 2018-04-09 DIAGNOSIS — F329 Major depressive disorder, single episode, unspecified: Secondary | ICD-10-CM | POA: Insufficient documentation

## 2018-04-09 DIAGNOSIS — I110 Hypertensive heart disease with heart failure: Secondary | ICD-10-CM | POA: Insufficient documentation

## 2018-04-09 DIAGNOSIS — R079 Chest pain, unspecified: Secondary | ICD-10-CM | POA: Diagnosis present

## 2018-04-09 DIAGNOSIS — Z9049 Acquired absence of other specified parts of digestive tract: Secondary | ICD-10-CM | POA: Insufficient documentation

## 2018-04-09 DIAGNOSIS — E039 Hypothyroidism, unspecified: Secondary | ICD-10-CM | POA: Insufficient documentation

## 2018-04-09 DIAGNOSIS — R091 Pleurisy: Secondary | ICD-10-CM

## 2018-04-09 DIAGNOSIS — Z87891 Personal history of nicotine dependence: Secondary | ICD-10-CM | POA: Diagnosis not present

## 2018-04-09 DIAGNOSIS — I2510 Atherosclerotic heart disease of native coronary artery without angina pectoris: Secondary | ICD-10-CM | POA: Diagnosis not present

## 2018-04-09 DIAGNOSIS — I5032 Chronic diastolic (congestive) heart failure: Secondary | ICD-10-CM | POA: Diagnosis not present

## 2018-04-09 DIAGNOSIS — J189 Pneumonia, unspecified organism: Secondary | ICD-10-CM | POA: Diagnosis not present

## 2018-04-09 DIAGNOSIS — F419 Anxiety disorder, unspecified: Secondary | ICD-10-CM | POA: Diagnosis not present

## 2018-04-09 DIAGNOSIS — Z955 Presence of coronary angioplasty implant and graft: Secondary | ICD-10-CM | POA: Insufficient documentation

## 2018-04-09 DIAGNOSIS — Z79899 Other long term (current) drug therapy: Secondary | ICD-10-CM | POA: Insufficient documentation

## 2018-04-09 DIAGNOSIS — R0602 Shortness of breath: Secondary | ICD-10-CM

## 2018-04-09 LAB — CBC WITH DIFFERENTIAL/PLATELET
Abs Immature Granulocytes: 0.03 10*3/uL (ref 0.00–0.07)
Basophils Absolute: 0 10*3/uL (ref 0.0–0.1)
Basophils Relative: 0 %
Eosinophils Absolute: 0.2 10*3/uL (ref 0.0–0.5)
Eosinophils Relative: 2 %
HEMATOCRIT: 44.3 % (ref 36.0–46.0)
Hemoglobin: 14.5 g/dL (ref 12.0–15.0)
Immature Granulocytes: 0 %
Lymphocytes Relative: 19 %
Lymphs Abs: 2.1 10*3/uL (ref 0.7–4.0)
MCH: 32.3 pg (ref 26.0–34.0)
MCHC: 32.7 g/dL (ref 30.0–36.0)
MCV: 98.7 fL (ref 80.0–100.0)
Monocytes Absolute: 1 10*3/uL (ref 0.1–1.0)
Monocytes Relative: 9 %
Neutro Abs: 7.7 10*3/uL (ref 1.7–7.7)
Neutrophils Relative %: 70 %
Platelets: 176 10*3/uL (ref 150–400)
RBC: 4.49 MIL/uL (ref 3.87–5.11)
RDW: 12.4 % (ref 11.5–15.5)
WBC: 11 10*3/uL — ABNORMAL HIGH (ref 4.0–10.5)
nRBC: 0 % (ref 0.0–0.2)

## 2018-04-09 LAB — URINALYSIS, ROUTINE W REFLEX MICROSCOPIC
Bilirubin Urine: NEGATIVE
Glucose, UA: NEGATIVE mg/dL
Hgb urine dipstick: NEGATIVE
Ketones, ur: 15 mg/dL — AB
Nitrite: NEGATIVE
Protein, ur: NEGATIVE mg/dL
SPECIFIC GRAVITY, URINE: 1.015 (ref 1.005–1.030)
pH: 6 (ref 5.0–8.0)

## 2018-04-09 LAB — COMPREHENSIVE METABOLIC PANEL
ALK PHOS: 80 U/L (ref 38–126)
ALT: 16 U/L (ref 0–44)
AST: 20 U/L (ref 15–41)
Albumin: 4.3 g/dL (ref 3.5–5.0)
Anion gap: 11 (ref 5–15)
BUN: 16 mg/dL (ref 8–23)
CO2: 20 mmol/L — ABNORMAL LOW (ref 22–32)
Calcium: 9.2 mg/dL (ref 8.9–10.3)
Chloride: 106 mmol/L (ref 98–111)
Creatinine, Ser: 0.91 mg/dL (ref 0.44–1.00)
GFR calc Af Amer: >60 mL/min (ref >60)
GFR calc non Af Amer: 57 mL/min — ABNORMAL LOW (ref >60)
GLUCOSE: 123 mg/dL — AB (ref 70–99)
Potassium: 3.8 mmol/L (ref 3.5–5.1)
Sodium: 137 mmol/L (ref 135–145)
Total Bilirubin: 1 mg/dL (ref 0.3–1.2)
Total Protein: 7.2 g/dL (ref 6.5–8.1)

## 2018-04-09 LAB — URINALYSIS, MICROSCOPIC (REFLEX)

## 2018-04-09 LAB — I-STAT CG4 LACTIC ACID, ED
Lactic Acid, Venous: 1.89 mmol/L (ref 0.5–1.9)
Lactic Acid, Venous: 2.8 mmol/L (ref 0.5–1.9)

## 2018-04-09 LAB — TROPONIN I: Troponin I: <0.03 ng/mL (ref <0.03)

## 2018-04-09 LAB — LIPASE, BLOOD: Lipase: 48 U/L (ref 11–51)

## 2018-04-09 MED ORDER — ALBUTEROL SULFATE HFA 108 (90 BASE) MCG/ACT IN AERS
2.0000 | INHALATION_SPRAY | Freq: Once | RESPIRATORY_TRACT | Status: AC
  Administered 2018-04-09: 2 via RESPIRATORY_TRACT
  Filled 2018-04-09: qty 6.7

## 2018-04-09 MED ORDER — AZITHROMYCIN 250 MG PO TABS
500.0000 mg | ORAL_TABLET | Freq: Once | ORAL | Status: AC
  Administered 2018-04-09: 500 mg via ORAL
  Filled 2018-04-09: qty 2

## 2018-04-09 MED ORDER — IPRATROPIUM-ALBUTEROL 0.5-2.5 (3) MG/3ML IN SOLN
3.0000 mL | Freq: Four times a day (QID) | RESPIRATORY_TRACT | Status: DC
  Administered 2018-04-09: 3 mL via RESPIRATORY_TRACT
  Filled 2018-04-09: qty 3

## 2018-04-09 MED ORDER — IBUPROFEN 400 MG PO TABS
400.0000 mg | ORAL_TABLET | Freq: Once | ORAL | Status: AC
  Administered 2018-04-09: 400 mg via ORAL
  Filled 2018-04-09: qty 1

## 2018-04-09 MED ORDER — IPRATROPIUM-ALBUTEROL 0.5-2.5 (3) MG/3ML IN SOLN
3.0000 mL | Freq: Once | RESPIRATORY_TRACT | Status: AC
  Administered 2018-04-09: 3 mL via RESPIRATORY_TRACT
  Filled 2018-04-09: qty 3

## 2018-04-09 MED ORDER — AZITHROMYCIN 250 MG PO TABS: 250.0000 mg | ORAL_TABLET | Freq: Every day | ORAL | 0 refills | Status: DC

## 2018-04-09 MED ORDER — IOPAMIDOL (ISOVUE-370) INJECTION 76%
100.0000 mL | Freq: Once | INTRAVENOUS | Status: AC | PRN
  Administered 2018-04-09: 60 mL via INTRAVENOUS

## 2018-04-09 NOTE — ED Notes (Signed)
Pts daughter states pt was vomiting with diarrhea taht resolved on Thursday. She states that pt has been having mild sob since Thursday that exacerbated yesterday, and at that time she began having pain with inspiration. Pt has diminished breath sounds bilaterally with rhonchi and expiratory wheezes. RT to administer neb treatment.

## 2018-04-09 NOTE — ED Notes (Signed)
SpO2 88-90% placed on 2l/m, spO2 94%after

## 2018-04-09 NOTE — ED Notes (Signed)
Pt requested and provided a personal fan

## 2018-04-09 NOTE — Discharge Instructions (Signed)
1.  Take Zithromax as prescribed.  Start your first home dose tomorrow. 2.  Use albuterol inhaler every 4-6 hours for shortness of breath over the next 3 days then as needed. 3.  Rest.  Take acetaminophen for body aches and pain. 4.  Return to the emergency department if symptoms are worsening or changing.

## 2018-04-09 NOTE — ED Provider Notes (Signed)
[facility]PARTMENT Provider Note   CSN: [id number] Arrival date & time: 04/09/18  1143     History   Chief Complaint Chief Complaint  Patient presents with  . Shortness of Breath    HPI [name] is a 84 y.o. female.  HPI Patient has prior medical history of breast cancer status post lumpectomy and [phone].  Patient did not take adjuvant patient therapy.  In November she is 6 months from surgery without evidence of disease recurrence by evaluation of Dr. Jana Hakim.  Patient presents today with chief complaint of right posterior thought thoracic chest pain.  This is worse with deep inspiration.  Patient feels short of breath for about a week.  Thoracic chest pain symptoms started within about the past 2 days.  Patient has had some congestion and a nonproductive cough.  No lower extremity swelling or calf pain.no h/o DVT. Past Medical History:  Diagnosis Date  . Abdominal aortic aneurysm ([place])    asymptomatic, 2012 -  2.6 x 2.4cm  . Anxiety   . CAD S/P percutaneous coronary angioplasty    Cath 2010: 100% occlusion of RCA  (2 BMS stents finally occluded) w/ left to right collaterals; moderate LAD and circumflex disease.  EF 55-60%;; cath 2015 with medical therapy recommended   . Chronic lower back pain   . Daily headache       . Depression   . Dyspnea    on exertion sometimes  . Exertional shortness of breath   . GERD (gastroesophageal reflux disease)   . History of kidney stones   . History of stomach ulcers   . Hyperlipidemia   . Hypertension   . Hypothyroidism   . Macular degeneration    "started out dry; now is wet" (01/08/2013)  . Migraines       . Myocardial infarction ([place]) 2003  . PAD (peripheral artery disease) ([place]) 12/2005   s/p L Common Iliac Stent  . PONV (postoperative nausea and vomiting)   . Walking pneumonia 1970's    Patient Active Problem List   Diagnosis Date Noted  . Abdominal pain 02/21/2018  . Malignant neoplasm  of upper-outer quadrant of left breast in female, estrogen receptor positive ([place]) 07/12/2017  . Orthostatic hypotension 03/12/2015  . Claudication in peripheral vascular disease ([place]) 02/14/2013  . GERD (gastroesophageal reflux disease) 11/25/2012  . PAD - s/p diamondback orbital rotation atherectomy, PTA + stenting of calcified ostical right CIA- 02/13/13   . CAD S/P percutaneous coronary angioplasty   . Abdominal aortic aneurysm ([place])   . PULMONARY NODULE 09/10/2008  . HYPOTHYROIDISM 01/16/2007  . Hyperlipidemia with target LDL less than 70 01/16/2007  . ANXIETY 01/16/2007  . Essential hypertension - With permissive HTN 01/16/2007  . Chronic diastolic heart failure, NYHA class 1 ([place]) 01/16/2007  . MENOPAUSAL SYNDROME 01/16/2007    Past Surgical History:  Procedure Laterality Date  . ABD AOTRA DOPPLER  08/14/2012   distal mild dilatation 2.6 x 2.5cm unchanged;right cia 70-99%  new finding  . Abdominal Aorta Duplex  09/2014   No change from prior study; 2.6 x 2.4 cm mild AAA. R Com Iliac A  > 50%. -- f/u 24 months.  . ABDOMINAL AORTAGRAM  01/08/2013   Procedure: ABDOMINAL Maxcine Ham;  Surgeon: Lorretta Harp, MD;  Location: [facility] CATH LAB;  Service: Cardiovascular;;  . ANGIOPLASTY Right 01/08/2013   unsucess stenting RLE   . APPENDECTOMY    . BREAST LUMPECTOMY WITH RADIOACTIVE SEED LOCALIZATION Left 08/23/2017  Procedure: BREAST LUMPECTOMY WITH RADIOACTIVE SEED LOCALIZATION;  Surgeon: Erroll Luna, MD;  Location: [facility];  Service: General;  Laterality: Left;  . CARDIAC CATHETERIZATION  11/2008   diffuse ISR in  RCA w/ good L-R collaterals; AV groove Cx - 60-70% stenosis with post stenosis ectasia;  EF 55%, mild  CAD in LAD -- Med Rx  . CATARACT EXTRACTION W/ INTRAOCULAR LENS  IMPLANT, BILATERAL Bilateral   . CHOLECYSTECTOMY    . CORONARY ANGIOPLASTY  April 2006   Cutting Balloon PTCA of RCA ISR  . CORONARY ANGIOPLASTY WITH STENT PLACEMENT  Jan 2004   RCA - 2 overlapping Pixel BMS  2.0 mm x 18 mm & 2.0 mm x 23 m   . DILATION AND CURETTAGE OF UTERUS     "couple over the years" (01/08/2013)  . ILIAC ARTERY STENT Left 12/30/2005   PTA anddirect stenting  of left common iliac artery - 8.0 x 18 mm OmniLink stent  . ILIAC ARTERY STENT Right 02/13/2013   successful diamondback orbital rotation lipectomy, PTA and stenting of highly calcified ostial common iliac artery/notes 02/13/2013  . LEFT HEART CATHETERIZATION WITH CORONARY ANGIOGRAM N/A 11/13/2013   Procedure: LEFT HEART CATHETERIZATION WITH CORONARY ANGIOGRAM;  Surgeon: Leonie Man, MD; LAD 40%, CFX 60%, OM1 60%, RCA diffuse in-stent restenosis up to 99%, small PDA and PLA, collaterals from left system noted, EF 55-60 percent  . LOWER EXTREMITY ANGIOGRAM N/A 01/08/2013   Procedure: LOWER EXTREMITY ANGIOGRAM;  Surgeon: Lorretta Harp, MD;  Location: [facility] CATH LAB;  Service: Cardiovascular;  Laterality: N/A;  . NM MYOCAR PERF WALL MOTION  12/27/2011 -[facility]   LV normal,EF 81 %  . NM MYOCAR PERF WALL MOTION  01/2015   EF 56%. Normal ROM of low risk. Small defect of mild severity and apical anterior location likely related to breast attenuation. Cannot rule out small area of mild ischemia.  . TRANSTHORACIC ECHOCARDIOGRAM  10/2013   Normal LV size low normal function. EF 50-55%. No regional wall motion abnormality. GR 1 DD. PAP ~ 47 mmHg     OB History   No obstetric history on file.      Home Medications    Prior to Admission medications   Medication Sig Start Date End Date Taking? Authorizing Provider  ALPRAZolam Duanne Moron) 0.5 MG tablet Take 0.5 mg by mouth daily as needed for anxiety.    [provider]  amLODipine (NORVASC) 2.5 MG tablet TAKE 1 TABLET EVERY DAY NEED MD APPOINTMENT FOR REFILLS 09/07/17   Leonie Man, MD  azithromycin (ZITHROMAX) 250 MG tablet Take 1 tablet (250 mg total) by mouth daily. 04/09/18   Charlesetta Shanks, MD  Cholecalciferol (VITAMIN D-3) 5000 units TABS Take 5,000 Units by  mouth daily.    [provider]  levothyroxine (SYNTHROID, LEVOTHROID) 100 MCG tablet Take 100 mcg by mouth daily. 10/29/14   [provider]  nitroGLYCERIN (NITROSTAT) 0.4 MG SL tablet Place 1 tablet (0.4 mg total) under the tongue every 5 (five) minutes as needed for chest pain. 02/11/16   Leonie Man, MD  Omega-3 Fatty Acids (FISH OIL) 1000 MG CAPS Take 1,000 mg by mouth daily.     [provider]  Polyethyl Glycol-Propyl Glycol (LUBRICANT EYE DROPS) 0.4-0.3 % SOLN Place 1 drop into both eyes daily.    [provider]  ranitidine (ZANTAC) 150 MG tablet Take 150 mg by mouth 2 (two) times daily as needed (FOR HEARTBURN/INDIGESTION/ACID REFLUX.).     [provider]  sertraline (ZOLOFT) 100 MG tablet Take 100 mg by mouth every evening. 05/19/17   [provider]  trandolapril ([place]) 2 MG tablet TAKE 1 TABLET EVERY DAY 02/07/18   Leonie Man, MD    Family History History reviewed. No pertinent family history.  Social History Social History   Tobacco Use  . Smoking status: Former Smoker    Packs/day: 1.00    Years: 40.00    Pack years: 40.00    Types: Cigarettes    Last attempt to quit: 09/18/1987    Years since quitting: 30.5  . Smokeless tobacco: Never Used  Substance Use Topics  . Alcohol use: No  . Drug use: No     Allergies   Avastin [bevacizumab]; Benadryl [diphenhydramine]; Hydrocodone-acetaminophen; Levofloxacin; Statins; Trazodone; and Beta adrenergic blockers   Review of Systems Review of Systems 10 Systems reviewed and are negative for acute change except as noted in the HPI.  Physical Exam Updated Vital Signs BP (!) 149/79   Pulse 85   Temp 98.1 F (36.7 C) (Oral)   Resp (!) 23   Ht 5\' 4"  (1.626 m)   Wt 66.2 kg   SpO2 97%   BMI 25.06 kg/m   Physical Exam Constitutional:      Comments: Patient is alert and appropriate.  Mild increased work of breathing at rest.  HENT:     Head: Normocephalic  and atraumatic.     Nose: Nose normal.     Mouth/Throat:     Mouth: Mucous membranes are moist.  Eyes:     Extraocular Movements: Extraocular movements intact.  Cardiovascular:     Rate and Rhythm: Normal rate and regular rhythm.  Pulmonary:     Comments: Mild increased work of breathing.  Crackles mid lung fields. Abdominal:     General: There is no distension.     Tenderness: There is no abdominal tenderness. There is no guarding.  Musculoskeletal: Normal range of motion.        General: No swelling, tenderness, deformity or signs of injury.     Right lower leg: No edema.     Left lower leg: No edema.  Skin:    General: Skin is warm and dry.  Neurological:     General: No focal deficit present.     Mental Status: She is oriented to person, place, and time.  Psychiatric:        Mood and Affect: Mood normal.      ED Treatments / Results  Labs (all labs ordered are listed, but only abnormal results are displayed) Labs Reviewed  COMPREHENSIVE METABOLIC PANEL - Abnormal; Notable for the following components:      Result Value   CO2 20 (*)    Glucose, Bld 123 (*)    GFR calc non Af Amer 57 (*)    All other components within normal limits  CBC WITH DIFFERENTIAL/PLATELET - Abnormal; Notable for the following components:   WBC 11.0 (*)    All other components within normal limits  URINALYSIS, ROUTINE W REFLEX MICROSCOPIC - Abnormal; Notable for the following components:   Ketones, ur 15 (*)    Leukocytes, UA SMALL (*)    All other components within normal limits  URINALYSIS, MICROSCOPIC (REFLEX) - Abnormal; Notable for the following components:   Bacteria, UA RARE (*)    All other components within normal limits  I-STAT CG4 LACTIC ACID, ED - Abnormal; Notable for the following components:   Lactic Acid, Venous 2.80 (*)  All other components within normal limits  URINE CULTURE  LIPASE, BLOOD  TROPONIN I  I-STAT CG4 LACTIC ACID, ED    EKG EKG  Interpretation  Date/Time:  Sunday April 09 2018 13:25:29 EST Ventricular Rate:  88 PR Interval:    QRS Duration: 81 QT Interval:  366 QTC Calculation: 443 R Axis:   56 Text Interpretation:  Sinus rhythm Multiple ventricular premature complexes Probable left atrial enlargement no change from previous Confirmed by Charlesetta Shanks [phone]) on 04/09/2018 3:03:42 PM   Radiology Dg Chest 2 View  Result Date: 04/09/2018 CLINICAL DATA:  Shortness of breath, cough, congestion and fever for 1 week, history of coronary artery disease post MI and coronary PTCA, hypertension EXAM: CHEST - 2 VIEW COMPARISON:  04/02/2016 FINDINGS: Upper normal size of cardiac silhouette. Atherosclerotic calcification aorta. Mediastinal contours and pulmonary vascularity normal. Lungs appear mildly hyperinflated with bibasilar atelectasis slightly increased on RIGHT. No definite acute infiltrate, pleural effusion, or pneumothorax. Biconvex thoracic scoliosis and osseous demineralization. IMPRESSION: Question COPD changes with bibasilar atelectasis. No acute infiltrate. Electronically Signed   By: Lavonia Dana M.D.   On: 04/09/2018 12:28   Ct Angio Chest Pe W/cm &/or Wo Cm  Result Date: 04/09/2018 CLINICAL DATA:  Shortness of breath and cough. Low oxygen saturation. EXAM: CT ANGIOGRAPHY CHEST WITH CONTRAST TECHNIQUE: Multidetector CT imaging of the chest was performed using the standard protocol during bolus administration of intravenous contrast. Multiplanar CT image reconstructions and MIPs were obtained to evaluate the vascular anatomy. CONTRAST:  33mL ISOVUE-370 IOPAMIDOL (ISOVUE-370) INJECTION 76% COMPARISON:  Chest radiography same day. Previous CT 04/02/2016. FINDINGS: Cardiovascular: Pulmonary arterial opacification is good. There are no pulmonary emboli. Heart size is normal. There is coronary artery calcification. There is aortic atherosclerosis with extensive calcified plaque. No sign of aneurysm or dissection.  Mediastinum/Nodes: No mass or lymphadenopathy. Lungs/Pleura: Mild upper lobe emphysema and pulmonary scarring. Mild bibasilar atelectasis. Early basilar pneumonia not excluded. No effusion. No mass. Upper Abdomen: No significant finding. Calcified granulomas in the liver. Cyst at the upper pole of left kidney, not completely imaged. Musculoskeletal: Chronic spinal curvature in degenerative change. Review of the MIP images confirms the above findings. IMPRESSION: 1. No pulmonary emboli. 2. Coronary artery calcification. 3. Mild emphysema and pulmonary scarring. Mild bibasilar atelectasis. Early basilar pneumonia not excluded. Aortic Atherosclerosis (ICD10-I70.0) and Emphysema (ICD10-J43.9). Electronically Signed   By: Nelson Chimes M.D.   On: 04/09/2018 15:44    Procedures Procedures (including critical care time)  Medications Ordered in ED Medications  ipratropium-albuterol (DUONEB) 0.5-2.5 (3) MG/3ML nebulizer solution 3 mL (3 mLs Nebulization Given 04/09/18 1339)  azithromycin (ZITHROMAX) tablet 500 mg (has no administration in time range)  albuterol (PROVENTIL HFA;VENTOLIN HFA) 108 (90 Base) MCG/ACT inhaler 2 puff (has no administration in time range)  ipratropium-albuterol (DUONEB) 0.5-2.5 (3) MG/3ML nebulizer solution 3 mL (3 mLs Nebulization Given 04/09/18 1206)  ibuprofen (ADVIL,MOTRIN) tablet 400 mg (400 mg Oral Given 04/09/18 1413)  iopamidol (ISOVUE-370) 76 % injection 100 mL (60 mLs Intravenous Contrast Given 04/09/18 1527)     Initial Impression / Assessment and Plan / ED Course  I have reviewed the triage vital signs and the nursing notes.  Pertinent labs & imaging results that were available during my care of the patient were reviewed by me and considered in my medical decision making (see chart for details).     Patient presents chiefly with pleurisy and shortness of breath.  Manera embolus has been ruled out.  CT chest does not  show consolidation but suggest possible early  basilar pneumonia.  Patient is nontoxic.  She has been ambulating the emergency department and maintains oxygen saturations greater than 90%.  This may be viral versus early bacterial pneumonia.  Will initiate Zithromax and have the patient take albuterol inhaler every 4-6 hours for the next 2 days then as needed.  Return precautions reviewed.  Patient instructed on using Tylenol for pleuritic chest pain.  Final Clinical Impressions(s) / ED Diagnoses   Final diagnoses:  Community acquired pneumonia, unspecified laterality  Shortness of breath  Pleurisy    ED Discharge Orders         Ordered    azithromycin (ZITHROMAX) 250 MG tablet  Daily     04/09/18 1607           Charlesetta Shanks, MD 04/09/18 1611

## 2018-04-09 NOTE — ED Notes (Signed)
Pt requesting ibuprofen for headache rated 5/10

## 2018-04-09 NOTE — ED Notes (Signed)
Pt ambulated to room from triage with standby assistance from family member

## 2018-04-09 NOTE — ED Notes (Signed)
Patient transported to X-ray 

## 2018-04-09 NOTE — ED Notes (Signed)
Urine specimen and culture collected, sent to lab

## 2018-04-09 NOTE — ED Triage Notes (Addendum)
Reports shortness of breath x 1 week.  Reports pain on inspiration.  Reports congestion x 1 week with nonproductive cough.

## 2018-04-09 NOTE — ED Notes (Addendum)
Pt o2 sats quickly recovered to 95% o room air after ambulating to restroom. Then pt experienced mild coughing. Reapplied O2.

## 2018-04-11 LAB — URINE CULTURE

## 2018-07-12 ENCOUNTER — Telehealth: Payer: Self-pay | Admitting: Oncology

## 2018-07-12 NOTE — Telephone Encounter (Signed)
GM PAL 4/6 lab/fu moved to 4/10. Spoke with dtr and per dtr she really doesn't want to bring her 85 year old mother out if not critical. Per dtr moved lab/fu from 4/10 to 5/12.

## 2018-07-24 ENCOUNTER — Ambulatory Visit: Payer: Medicare HMO | Admitting: Oncology

## 2018-07-24 ENCOUNTER — Other Ambulatory Visit: Payer: Medicare HMO

## 2018-07-28 ENCOUNTER — Other Ambulatory Visit: Payer: Medicare HMO

## 2018-07-28 ENCOUNTER — Ambulatory Visit: Payer: Medicare HMO | Admitting: Oncology

## 2018-08-28 NOTE — Progress Notes (Signed)
[place]  [phone] [phone]     ID: [name] DOB: 1929/12/10  MR#: [id number]  HYQ#:[id number]  Patient Care Team: Drake Leach, MD as PCP - General (Internal Medicine) Leonie Man, MD as PCP - Cardiology (Cardiology) Nehan Flaum, Virgie Dad, MD as Consulting Physician (Oncology) Erroll Luna, MD as Consulting Physician (General Surgery) Leonie Man, MD as Consulting Physician (Cardiology) Juanita Craver, MD as Consulting Physician (Gastroenterology) Eppie Gibson, MD as Attending Physician (Radiation Oncology) Gardenia Phlegm, NP as Nurse Practitioner (Hematology and Oncology) OTHER MD:  I connected with [name] on 08/29/18 at  3:00 PM EDT by telephone visit and verified that I am speaking with the correct person using two identifiers.   I discussed the limitations, risks, security and privacy concerns of performing an evaluation and management service by telemedicine and the availability of in-person appointments. I also discussed with the patient that there may be a patient responsible charge related to this service. The patient expressed understanding and agreed to proceed.   Other persons participating in the visit and their role in the encounter: Suzi Roots, daughter; Wilburn Mylar, scribe   Patient's location: home  Provider's location: [place]    CHIEF COMPLAINT: Estrogen receptor positive breast [name]  CURRENT TREATMENT: Observation   INTERVAL HISTORY: [name] is contacted today for follow-up of her estrogen receptor positive breast [name]. Her daughter Suzi Roots assisted.  Since her last visit, the patient underwent bilateral diagnostic mammography with tomography at [facility] on 07/04/2018 showing: breast density category C; no evidence of malignancy in either breast.   REVIEW OF SYSTEMS: [name] reports doing well overall. She notes an aching in her right breast more so than  in her left, despite that it was her left that underwent surgery. She works in her yard and uses her stationary bike for exercise.   The patient denies unusual headaches, visual changes, nausea, vomiting, stiff neck, dizziness, or gait imbalance. There has been no cough, phlegm production, or pleurisy, no chest pain or pressure, and no change in bowel or bladder habits. The patient denies fever, rash, bleeding, unexplained fatigue or unexplained weight loss. A detailed review of systems was otherwise entirely negative.   HISTORY OF CURRENT ILLNESS: From the original intake note:  [name] had an annual wellness check up on 06/22/2017. Her PCP palpated a possible mass in the left breast and recommended that she have a diagnostic mammogram. She underwent bilateral diagnostic mammography with tomography and left breast ultrasonography at [facility] on 07/05/2017 showing: breast density category C. There is an irregular asymmetry in the left breast the 5 o'clock position lower outer quadrant. Sonographically, there was a mass in the 2 o'clock upper outer position anterior depth of the left breast measuring 2 cm. No abnormalities were found in the left axilla.   Accordingly on 07/06/2017 she proceeded to biopsy of the left breast area in question. The pathology from this procedure showed ([id number]): Invasive ductal carcinoma, grade II.  Also ductal carcinoma in situ. Prognostic indicators significant for: estrogen receptor, 100% positive and progesterone receptor, 100% positive, both with strong staining intensity. Proliferation marker Ki67 at 12%. HER2 not amplified with ratios HER2/CEP17 signals 1.37 and average HER2 copies per cell 1.85  The patient's subsequent history is as detailed below.   PAST MEDICAL HISTORY: Past Medical History:  Diagnosis Date  . Abdominal aortic aneurysm ([place])    asymptomatic, 2012 -  2.6 x 2.4cm  . Anxiety   .  CAD S/P percutaneous coronary angioplasty    Cath 2010:  100% occlusion of RCA  (2 BMS stents finally occluded) w/ left to right collaterals; moderate LAD and circumflex disease.  EF 55-60%;; cath 2015 with medical therapy recommended   . Chronic lower back pain   . Daily headache       . Depression   . Dyspnea    on exertion sometimes  . Exertional shortness of breath   . GERD (gastroesophageal reflux disease)   . History of kidney stones   . History of stomach ulcers   . Hyperlipidemia   . Hypertension   . Hypothyroidism   . Macular degeneration    "started out dry; now is wet" (01/08/2013)  . Migraines       . Myocardial infarction ([place]) 2003  . PAD (peripheral artery disease) ([place]) 12/2005   s/p L Common Iliac Stent  . PONV (postoperative nausea and vomiting)   . Walking pneumonia 1970's  Legally blind   PAST SURGICAL HISTORY: Past Surgical History:  Procedure Laterality Date  . ABD AOTRA DOPPLER  08/14/2012   distal mild dilatation 2.6 x 2.5cm unchanged;right cia 70-99%  new finding  . Abdominal Aorta Duplex  09/2014   No change from prior study; 2.6 x 2.4 cm mild AAA. R Com Iliac A  > 50%. -- f/u 24 months.  . ABDOMINAL AORTAGRAM  01/08/2013   Procedure: ABDOMINAL Maxcine Ham;  Surgeon: Lorretta Harp, MD;  Location: [facility] CATH LAB;  Service: Cardiovascular;;  . ANGIOPLASTY Right 01/08/2013   unsucess stenting RLE   . APPENDECTOMY    . BREAST LUMPECTOMY WITH RADIOACTIVE SEED LOCALIZATION Left 08/23/2017   Procedure: BREAST LUMPECTOMY WITH RADIOACTIVE SEED LOCALIZATION;  Surgeon: Erroll Luna, MD;  Location: [facility];  Service: General;  Laterality: Left;  . CARDIAC CATHETERIZATION  11/2008   diffuse ISR in  RCA w/ good L-R collaterals; AV groove Cx - 60-70% stenosis with post stenosis ectasia;  EF 55%, mild  CAD in LAD -- Med Rx  . CATARACT EXTRACTION W/ INTRAOCULAR LENS  IMPLANT, BILATERAL Bilateral   . CHOLECYSTECTOMY    . CORONARY ANGIOPLASTY  April 2006   Cutting Balloon PTCA of RCA ISR  . CORONARY ANGIOPLASTY WITH STENT  PLACEMENT  Jan 2004   RCA - 2 overlapping Pixel BMS 2.0 mm x 18 mm & 2.0 mm x 23 m   . DILATION AND CURETTAGE OF UTERUS     "couple over the years" (01/08/2013)  . ILIAC ARTERY STENT Left 12/30/2005   PTA anddirect stenting  of left common iliac artery - 8.0 x 18 mm OmniLink stent  . ILIAC ARTERY STENT Right 02/13/2013   successful diamondback orbital rotation lipectomy, PTA and stenting of highly calcified ostial common iliac artery/notes 02/13/2013  . LEFT HEART CATHETERIZATION WITH CORONARY ANGIOGRAM N/A 11/13/2013   Procedure: LEFT HEART CATHETERIZATION WITH CORONARY ANGIOGRAM;  Surgeon: Leonie Man, MD; LAD 40%, CFX 60%, OM1 60%, RCA diffuse in-stent restenosis up to 99%, small PDA and PLA, collaterals from left system noted, EF 55-60 percent  . LOWER EXTREMITY ANGIOGRAM N/A 01/08/2013   Procedure: LOWER EXTREMITY ANGIOGRAM;  Surgeon: Lorretta Harp, MD;  Location: [facility] C[facility] LAB;  Service: Cardiovascular;  Laterality: N/A;  . NM MYOCAR PERF WALL MOTION  12/27/2011 -[place]   LV normal,EF 81 %  . NM MYOCAR PERF WALL MOTION  01/2015   EF 56%. Normal ROM of low risk. Small defect of mild severity and apical anterior location likely  related to breast attenuation. Cannot rule out small area of mild ischemia.  . TRANSTHORACIC ECHOCARDIOGRAM  10/2013   Normal LV size low normal function. EF 50-55%. No regional wall motion abnormality. GR 1 DD. PAP ~ 47 mmHg    FAMILY HISTORY Family History  Problem Relation Age of Onset  . Diabetes Father   . Breast [name] Neg Hx   . Ovarian [name] Neg Hx    The patient's father died at age 22 due to diabetes and heart issues. The patient's mother died at age 83 due to old age. The patient has 6 brothers and 2 sisters. She denies a family history of breast or ovarian [name].   GYNECOLOGIC HISTORY:  No LMP recorded. Patient is postmenopausal. Menarche: 85 years old Age at first live birth: 85 years old She is GXP4.  Her LMP was at age 26.  She  never used contraceptives. 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 She is widowed and lives by herself, with no pets. Daughter, [name], lives in [place] and is retired. Son, Juanda Crumble, lives in [place] and is a Administrator. Son, Coralyn Mark, lives in [place] and is a Freight forwarder. The patient's son, Cecilie Lowers died only a few months ago from a sudden intracranial bleed. The patient has 6 grandchildren and 1 great-grandchild. She attends [employer] in [place].     ADVANCED DIRECTIVES: Suzi Roots (her daughter) is healthcare power of attorney   HEALTH MAINTENANCE: Social History   Tobacco Use  . Smoking status: Former Smoker    Packs/day: 1.00    Years: 40.00    Pack years: 40.00    Types: Cigarettes    Last attempt to quit: 09/18/1987    Years since quitting: 30.9  . Smokeless tobacco: Never Used  Substance Use Topics  . Alcohol use: No  . Drug use: No     Colonoscopy: 2009 under Dr. Collene Mares  PAP: 10+ years ago  Bone density: 09/06/2017 T score -4.0   Allergies  Allergen Reactions  . Avastin [Bevacizumab] Other (See Comments)    Muscle pain  . Benadryl [Diphenhydramine] Palpitations and Other (See Comments)    TACHYCARDIA  . Hydrocodone-Acetaminophen Nausea And Vomiting and Other (See Comments)    Delirium  . Levofloxacin Other (See Comments)    Myalgias  . Statins Other (See Comments)    Myalgias  . Trazodone Other (See Comments)    UNSPECIFIED REACTION "TERRIBLE SIDE EFFECTS"  . Beta Adrenergic Blockers Other (See Comments)    fatigue    Current Outpatient Medications  Medication Sig Dispense Refill  . ALPRAZolam (XANAX) 0.5 MG tablet Take 0.5 mg by mouth daily as needed for anxiety.    [name] amLODipine (NORVASC) 2.5 MG tablet TAKE 1 TABLET EVERY DAY NEED MD APPOINTMENT FOR REFILLS 180 tablet 1  . azithromycin (ZITHROMAX) 250 MG tablet Take 1 tablet (250 mg total) by mouth daily. 4 tablet 0   . Cholecalciferol (VITAMIN D-3) 5000 units TABS Take 5,000 Units by mouth daily.    [name] levothyroxine (SYNTHROID, LEVOTHROID) 100 MCG tablet Take 100 mcg by mouth daily.    . nitroGLYCERIN (NITROSTAT) 0.4 MG SL tablet Place 1 tablet (0.4 mg total) under the tongue every 5 (five) minutes as needed for chest pain. 25 tablet 4  . Omega-3 Fatty Acids (FISH OIL) 1000 MG CAPS Take 1,000 mg by mouth daily.     Vladimir Faster  Glycol-Propyl Glycol (LUBRICANT EYE DROPS) 0.4-0.3 % SOLN Place 1 drop into both eyes daily.    . ranitidine (ZANTAC) 150 MG tablet Take 150 mg by mouth 2 (two) times daily as needed (FOR HEARTBURN/INDIGESTION/ACID REFLUX.).     [name] sertraline (ZOLOFT) 100 MG tablet Take 100 mg by mouth every evening.    . trandolapril (MAVIK) 2 MG tablet TAKE 1 TABLET EVERY DAY 90 tablet 1   No current facility-administered medications for this visit.     OBJECTIVE: Older white woman in no acute distress  There were no vitals filed for this visit.   There is no height or weight on file to calculate BMI.   Wt Readings from Last 3 Encounters:  04/09/18 146 lb (66.2 kg)  02/21/18 145 lb 12.8 oz (66.1 kg)  10/28/17 147 lb 6.4 oz (66.9 kg)      ECOG FS:2 - Symptomatic, <50% confined to bed  LAB RESULTS:  CMP     Component Value Date/Time   NA 137 04/09/2018 1213   K 3.8 04/09/2018 1213   CL 106 04/09/2018 1213   CO2 20 (L) 04/09/2018 1213   GLUCOSE 123 (H) 04/09/2018 1213   BUN 16 04/09/2018 1213   CREATININE 0.91 04/09/2018 1213   CREATININE 0.81 02/21/2018 1123   CREATININE 0.73 11/07/2013 1346   CALCIUM 9.2 04/09/2018 1213   PROT 7.2 04/09/2018 1213   ALBUMIN 4.3 04/09/2018 1213   AST 20 04/09/2018 1213   AST 16 02/21/2018 1123   ALT 16 04/09/2018 1213   ALT 12 02/21/2018 1123   ALKPHOS 80 04/09/2018 1213   BILITOT 1.0 04/09/2018 1213   BILITOT 0.4 02/21/2018 1123   GFRNONAA 57 (L) 04/09/2018 1213   GFRNONAA >60 02/21/2018 1123   GFRAA >60 04/09/2018 1213   GFRAA >60  02/21/2018 1123    No results found for: TOTALPROTELP, ALBUMINELP, A1GS, A2GS, BETS, BETA2SER, GAMS, MSPIKE, SPEI  No results found for: KPAFRELGTCHN, LAMBDASER, KAPLAMBRATIO  Lab Results  Component Value Date   WBC 11.0 (H) 04/09/2018   NEUTROABS 7.7 04/09/2018   HGB 14.5 04/09/2018   HCT 44.3 04/09/2018   MCV 98.7 04/09/2018   PLT 176 04/09/2018    No results found for: LABCA2  No components found for: HQIONG295  No results for input(s): INR in the last 168 hours.  No results found for: LABCA2  No results found for: MWU132  No results found for: GMW102  No results found for: VOZ366  No results found for: CA2729  No components found for: HGQUANT  No results found for: CEA1 / No results found for: CEA1   No results found for: AFPTUMOR  No results found for: CHROMOGRNA  No results found for: PSA1  No visits with results within 3 Day(s) from this visit.  Latest known visit with results is:  Admission on 04/09/2018, Discharged on 04/09/2018  Component Date Value Ref Range Status  . Sodium 04/09/2018 137  135 - 145 mmol/L Final  . Potassium 04/09/2018 3.8  3.5 - 5.1 mmol/L Final  . Chloride 04/09/2018 106  98 - 111 mmol/L Final  . CO2 04/09/2018 20* 22 - 32 mmol/L Final  . Glucose, Bld 04/09/2018 123* 70 - 99 mg/dL Final  . BUN 04/09/2018 16  8 - 23 mg/dL Final  . Creatinine, Ser 04/09/2018 0.91  0.44 - 1.00 mg/dL Final  . Calcium 04/09/2018 9.2  8.9 - 10.3 mg/dL Final  . Total Protein 04/09/2018 7.2  6.5 - 8.1 g/dL Final  .  Albumin 04/09/2018 4.3  3.5 - 5.0 g/dL Final  . AST 04/09/2018 20  15 - 41 U/L Final  . ALT 04/09/2018 16  0 - 44 U/L Final  . Alkaline Phosphatase 04/09/2018 80  38 - 126 U/L Final  . Total Bilirubin 04/09/2018 1.0  0.3 - 1.2 mg/dL Final  . GFR calc non Af Amer 04/09/2018 57* >60 mL/min Final  . GFR calc Af Amer 04/09/2018 >60  >60 mL/min Final  . Anion gap 04/09/2018 11  5 - 15 Final   Performed at [facility], [address] [place], [place] [address]  . Lipase 04/09/2018 48  11 - 51 U/L Final   Performed at [facility], [address] [place], [place] [address]  . WBC 04/09/2018 11.0* 4.0 - 10.5 K/uL Final  . RBC 04/09/2018 4.49  3.87 - 5.11 MIL/uL Final  . Hemoglobin 04/09/2018 14.5  12.0 - 15.0 g/dL Final  . HCT 04/09/2018 44.3  36.0 - 46.0 % Final  . MCV 04/09/2018 98.7  80.0 - 100.0 fL Final  . MCH 04/09/2018 32.3  26.0 - 34.0 pg Final  . MCHC 04/09/2018 32.7  30.0 - 36.0 g/dL Final  . RDW 04/09/2018 12.4  11.5 - 15.5 % Final  . Platelets 04/09/2018 176  150 - 400 K/uL Final  . nRBC 04/09/2018 0.0  0.0 - 0.2 % Final  . Neutrophils Relative % 04/09/2018 70  % Final  . Neutro Abs 04/09/2018 7.7  1.7 - 7.7 K/uL Final  . Lymphocytes Relative 04/09/2018 19  % Final  . Lymphs Abs 04/09/2018 2.1  0.7 - 4.0 K/uL Final  . Monocytes Relative 04/09/2018 9  % Final  . Monocytes Absolute 04/09/2018 1.0  0.1 - 1.0 K/uL Final  . Eosinophils Relative 04/09/2018 2  % Final  . Eosinophils Absolute 04/09/2018 0.2  0.0 - 0.5 K/uL Final  . Basophils Relative 04/09/2018 0  % Final  . Basophils Absolute 04/09/2018 0.0  0.0 - 0.1 K/uL Final  . Immature Granulocytes 04/09/2018 0  % Final  . Abs Immature Granulocytes 04/09/2018 0.03  0.00 - 0.07 K/uL Final   Performed at [facility], [address] [place], [place] [address]  . Lactic Acid, Venous 04/09/2018 1.89  0.5 - 1.9 mmol/L Final  . Color, Urine 04/09/2018 YELLOW  YELLOW Final  . APPearance 04/09/2018 CLEAR  CLEAR Final  . Specific Gravity, Urine 04/09/2018 1.015  1.005 - 1.030 Final  . pH 04/09/2018 6.0  5.0 - 8.0 Final  . Glucose, UA 04/09/2018 NEGATIVE  NEGATIVE mg/dL Final  . Hgb urine dipstick 04/09/2018 NEGATIVE  NEGATIVE Final  . Bilirubin Urine 04/09/2018 NEGATIVE  NEGATIVE Final  . Ketones, ur 04/09/2018 15* NEGATIVE mg/dL Final  . Protein, ur 04/09/2018 NEGATIVE  NEGATIVE mg/dL Final  . Nitrite 04/09/2018 NEGATIVE  NEGATIVE  Final  . Leukocytes, UA 04/09/2018 SMALL* NEGATIVE Final   Performed at [facility], [address] [place], [place] [address]  . Troponin I 04/09/2018 <0.03  <0.03 ng/mL Final   Performed at [facility], [address] [place],  [address]  . RBC / HPF 04/09/2018 0-5  0 - 5 RBC/hpf Final  . WBC, UA 04/09/2018 6-10  0 - 5 WBC/hpf Final  . Bacteria, UA 04/09/2018 RARE* NONE SEEN Final  . Squamous Epithelial / LPF 04/09/2018 0-5  0 - 5 Final   Performed at [facility]  [address], [address], [place], [place] [address]  . Lactic Acid, Venous 04/09/2018 2.80* 0.5 - 1.9 mmol/L Final  . Comment 04/09/2018 NOTIFIED PHYSICIAN   Final  . Specimen Description 04/09/2018    Final                   Value:URINE, RANDOM Performed at [facility], [address] [place], [place] [address]   . Special Requests 04/09/2018    Final                   Value:NONE Performed at [facility], [address] [place], [place] [address]   . Culture 04/09/2018 Multiple bacterial morphotypes present, none predominant. Suggest appropriate recollection if clinically indicated.   Final  . Report Status 04/09/2018 04/11/2018 FINAL   Final    (this displays the last labs from the last 3 days)  No results found for: TOTALPROTELP, ALBUMINELP, A1GS, A2GS, BETS, BETA2SER, GAMS, MSPIKE, SPEI (this displays SPEP labs)  No results found for: KPAFRELGTCHN, LAMBDASER, KAPLAMBRATIO (kappa/lambda light chains)  No results found for: HGBA, HGBA2QUANT, HGBFQUANT, HGBSQUAN (Hemoglobinopathy evaluation)   No results found for: LDH  No results found for: IRON, TIBC, IRONPCTSAT (Iron and TIBC)  No results found for: FERRITIN  Urinalysis    Component Value Date/Time   COLORURINE YELLOW 04/09/2018 [place] 04/09/2018 1402   LABSPEC 1.015 04/09/2018 1402   PHURINE 6.0 04/09/2018 1402   GLUCOSEU NEGATIVE 04/09/2018 1402   HGBUR NEGATIVE  04/09/2018 1402   BILIRUBINUR NEGATIVE 04/09/2018 1402   KETONESUR 15 (A) 04/09/2018 1402   PROTEINUR NEGATIVE 04/09/2018 1402   NITRITE NEGATIVE 04/09/2018 1402   LEUKOCYTESUR SMALL (A) 04/09/2018 1402     STUDIES: No results found.   ELIGIBLE FOR AVAILABLE RESEARCH PROTOCOL: Exact sciences study  ASSESSMENT: 85 y.o. [place], [place] woman status post left breast upper outer quadrant biopsy 07/06/2017 for a clinical T1c N0, stage IA invasive ductal carcinoma, grade 2, estrogen and progesterone receptor positive, HER-2 not amplified, with an MIB-1 of 12%  (1) status post left lumpectomy without sentinel lymph node sampling 08/23/2017 for a pT2 pNX, stage IIA invasive ductal carcinoma, grade 3, with a positive anterior margin (skin), and multiple close margins  (2) opted against adjuvant radiation   (3) letrozole started 09/02/2017, discontinued 09/30/2017 secondary to intolerance  PLAN:  [name] is now a year out from definitive surgery for breast [name].  There is no evidence of disease recurrence.  This is favorable.  She was not able to tolerate letrozole and we are reluctant to go to tamoxifen or similar drugs given concerns regarding blood clots.  Accordingly we are following with observation alone.  I reassured the patient and her daughter that mastalgia is generally benign.  She has a little bit of discomfort in the right breast.  She just had a mammogram recently which was fine.  This requires no further evaluation at this point  She will be seeing her primary care physician in about 6 months.  Accordingly she will return to see me in 1 year.  She knows to call for any other issues that may develop before then.   Unita Detamore, Virgie Dad, MD  08/29/18 2:47 PM Medical Oncology and Hematology [facility] [address] [place], [place] [address] Tel. [phone]    Fax. [phone]   I, Wilburn Mylar, am acting as scribe for Dr. Virgie Dad. Rondel Episcopo.  Joylene Grapes  Broady Lafoy MD, have reviewed the above documentation for accuracy and completeness, and I agree with the above.    

## 2018-08-29 ENCOUNTER — Inpatient Hospital Stay: Payer: Medicare HMO | Attending: Oncology | Admitting: Oncology

## 2018-08-29 ENCOUNTER — Encounter: Payer: Self-pay | Admitting: Oncology

## 2018-08-29 ENCOUNTER — Other Ambulatory Visit: Payer: Medicare HMO

## 2018-08-29 DIAGNOSIS — Z79899 Other long term (current) drug therapy: Secondary | ICD-10-CM

## 2018-08-29 DIAGNOSIS — Z170 Estrogen receptor positive status [ER+]: Secondary | ICD-10-CM | POA: Diagnosis not present

## 2018-08-29 DIAGNOSIS — C50412 Malignant neoplasm of upper-outer quadrant of left female breast: Secondary | ICD-10-CM

## 2018-08-29 DIAGNOSIS — E039 Hypothyroidism, unspecified: Secondary | ICD-10-CM | POA: Diagnosis not present

## 2018-08-29 DIAGNOSIS — I10 Essential (primary) hypertension: Secondary | ICD-10-CM

## 2018-08-29 DIAGNOSIS — Z87891 Personal history of nicotine dependence: Secondary | ICD-10-CM

## 2018-10-05 ENCOUNTER — Other Ambulatory Visit: Payer: Self-pay | Admitting: Cardiology

## 2018-10-05 MED ORDER — AMLODIPINE BESYLATE 2.5 MG PO TABS: ORAL_TABLET | ORAL | 0 refills | Status: DC

## 2018-10-05 NOTE — Telephone Encounter (Signed)
New Message     *STAT* If patient is at the pharmacy, call can be transferred to refill team.   1. Which medications need to be refilled? (please list name of each medication and dose if known) amlodipine 2.5 mg   2. Which pharmacy/location (including street and city if local pharmacy) is medication to be sent to? [facility]   3. Do they need a 30 day or 90 day supply? 90 day

## 2018-10-17 ENCOUNTER — Other Ambulatory Visit: Payer: Self-pay | Admitting: Cardiology

## 2018-10-17 NOTE — Telephone Encounter (Signed)
 *  STAT* If patient is at the pharmacy, call can be transferred to refill team.   1. Which medications need to be refilled? (please list name of each medication and dose if known) amLODipine (NORVASC) 2.5 MG tablet  2. Which pharmacy/location (including street and city if local pharmacy) is medication to be sent to? [facility]   3. Do they need a 30 day or 90 day supply? 35  Family states that [facility] has not received the new prescription. Patient has appt on 10/19/18 with Dr Ellyn Hack

## 2018-10-18 ENCOUNTER — Telehealth: Payer: Self-pay | Admitting: Cardiology

## 2018-10-18 NOTE — Telephone Encounter (Signed)
° ° ° °  1. Confirm consent - "In the setting of the current Covid19 crisis, you are scheduled for a (phone or video) visit with your provider on (date) at (time).  Just as we do with many in-office visits, in order for you to participate in this visit, we must obtain consent.  If you'd like, I can send this to your mychart (if signed up) or email for you to review.  Otherwise, I can obtain your verbal consent now.  All virtual visits are billed to your insurance company just like a normal visit would be.  By agreeing to a virtual visit, we'd like you to understand that the technology does not allow for your provider to perform an examination, and thus may limit your provider's ability to fully assess your condition. If your provider identifies any concerns that need to be evaluated in person, we will make arrangements to do so.  Finally, though the technology is pretty good, we cannot assure that it will always work on either your or our end, and in the setting of a video visit, we may have to convert it to a phone-only visit.  In either situation, we cannot ensure that we have a secure connection.  Are you willing to proceed?" STAFF: Did the patient verbally acknowledge consent to telehealth visit? Document YES/NO here: Yes  .

## 2018-10-19 ENCOUNTER — Telehealth: Payer: Self-pay | Admitting: *Deleted

## 2018-10-19 ENCOUNTER — Telehealth (INDEPENDENT_AMBULATORY_CARE_PROVIDER_SITE_OTHER): Payer: Medicare HMO | Admitting: Cardiology

## 2018-10-19 VITALS — Ht 64.0 in | Wt 145.0 lb

## 2018-10-19 DIAGNOSIS — I714 Abdominal aortic aneurysm, without rupture, unspecified: Secondary | ICD-10-CM

## 2018-10-19 DIAGNOSIS — E785 Hyperlipidemia, unspecified: Secondary | ICD-10-CM

## 2018-10-19 DIAGNOSIS — I10 Essential (primary) hypertension: Secondary | ICD-10-CM | POA: Diagnosis not present

## 2018-10-19 DIAGNOSIS — I739 Peripheral vascular disease, unspecified: Secondary | ICD-10-CM

## 2018-10-19 DIAGNOSIS — I2510 Atherosclerotic heart disease of native coronary artery without angina pectoris: Secondary | ICD-10-CM | POA: Diagnosis not present

## 2018-10-19 DIAGNOSIS — Z9861 Coronary angioplasty status: Secondary | ICD-10-CM

## 2018-10-19 MED ORDER — AMLODIPINE BESYLATE 2.5 MG PO TABS: ORAL_TABLET | ORAL | 3 refills | Status: DC

## 2018-10-19 NOTE — Telephone Encounter (Signed)
Left message on patient answer machine mailing  avs summary for 10/19/18 appointment. , also contacted daughter and information  given - verbalized understanding - RX  E-sent to mail order - amlodipine

## 2018-10-19 NOTE — Patient Instructions (Addendum)
Medication Instructions:  NO CHANGES  If you need a refill on your cardiac medications before your next appointment, please call your pharmacy.   Lab work: NOT NEEDED  Testing/Procedures:  NOT NEEDED Follow-Up: At [facility], you and your health needs are our priority.  As part of our continuing mission to provide you with exceptional heart care, we have created designated Provider Care Teams.  These Care Teams include your primary Cardiologist (physician) and Advanced Practice Providers (APPs -  Physician Assistants and Nurse Practitioners) who all work together to provide you with the care you need, when you need it. . You will need a follow up appointment in 8 months FEB 2021.  Please call our office 2 months in advance to schedule this appointment.  You may see Glenetta Hew, MD or one of the following Advanced Practice Providers on your designated Care Team:   . Rosaria Ferries, PA-C . Jory Sims, DNP, ANP  Any Other Special Instructions Will Be Listed Below (If Applicable).

## 2018-10-19 NOTE — Progress Notes (Signed)
Virtual Visit via Telephone Note   This visit type was conducted due to national recommendations for restrictions regarding the COVID-19 Pandemic (e.g. social distancing) in an effort to limit this patient's exposure and mitigate transmission in our community.  Due to her co-morbid illnesses, this patient is at least at moderate risk for complications without adequate follow up.  This format is felt to be most appropriate for this patient at this time.  The patient did not have access to video technology/had technical difficulties with video requiring transitioning to audio format only (telephone).  All issues noted in this document were discussed and addressed.  No physical exam could be performed with this format.  Please refer to the patient's chart for her  consent to telehealth for [facility].   Patient has given verbal permission to conduct this visit via virtual appointment and to bill insurance 10/21/2018 3:45 PM     Evaluation Performed:  Follow-up visit  Date:  10/21/2018   ID:  [name], DOB Oct 20, 1929, MRN [id number]  Patient Location: Home Provider Location: Office  PCP:  Drake Leach, MD  Cardiologist:  Glenetta Hew, MD  Electrophysiologist:  None   Chief Complaint:  Annual F/u   History of Present Illness:    [name] is a 85 y.o. female with PMH notable for CAD-PCI &PAD who presents via audio/video conferencing for a telehealth visit today.  [name] was last seen in May 2019 - mild DOE, but limited by back pain.    Interval History:  Doing well - no complaints. House cleaning, gardening & stationary bike (most days)  Cardiovascular ROS: no chest pain or dyspnea on exertion negative for - edema, irregular heartbeat, orthopnea, palpitations, paroxysmal nocturnal dyspnea, rapid heart rate, shortness of breath or syncope/near syncope; TIA/amaurosis fugax  The patient does not have symptoms concerning for COVID-19 infection (fever, chills, cough, or  new shortness of breath).  The patient is practicing social distancing.   ROS:  Please see the history of present illness.    -- nearly blind Review of Systems  Constitutional: Negative for malaise/fatigue and weight loss.  HENT: Negative for congestion and nosebleeds.   Respiratory: Negative for shortness of breath.   Cardiovascular: Negative for chest pain.  Gastrointestinal: Negative for abdominal pain, blood in stool, constipation, diarrhea, heartburn and melena.  Genitourinary: Negative for hematuria.  Musculoskeletal: Positive for back pain, falls (2 months ago - not sure how -- cracked some ribs) and joint pain.  Neurological: Negative for dizziness, focal weakness, weakness and headaches.  Psychiatric/Behavioral: Negative for memory loss. The patient is not nervous/anxious and does not have insomnia.   All other systems reviewed and are negative.   Past Medical History:  Diagnosis Date  . Abdominal aortic aneurysm ([place])    asymptomatic, 2012 -  2.6 x 2.4cm  . Anxiety   . CAD S/P percutaneous coronary angioplasty    Cath 2010: 100% occlusion of RCA  (2 BMS stents finally occluded) w/ left to right collaterals; moderate LAD and circumflex disease.  EF 55-60%;; cath 2015 with medical therapy recommended   . Chronic lower back pain   . Daily headache       . Depression   . Dyspnea    on exertion sometimes  . Exertional shortness of breath   . GERD (gastroesophageal reflux disease)   . History of kidney stones   . History of stomach ulcers   . Hyperlipidemia   . Hypertension   . Hypothyroidism   .  Macular degeneration    "started out dry; now is wet" (01/08/2013)  . Migraines       . Myocardial infarction ([place]) 2003  . PAD (peripheral artery disease) ([place]) 12/2005   s/p L Common Iliac Stent  . PONV (postoperative nausea and vomiting)   . Walking pneumonia 1970's   Past Surgical History:  Procedure Laterality Date  . ABD AOTRA DOPPLER  08/14/2012   distal mild  dilatation 2.6 x 2.5cm unchanged;right cia 70-99%  new finding  . Abdominal Aorta Duplex  09/2014   No change from prior study; 2.6 x 2.4 cm mild AAA. R Com Iliac A  > 50%. -- f/u 24 months.  . ABDOMINAL AORTAGRAM  01/08/2013   Procedure: ABDOMINAL Maxcine Ham;  Surgeon: Lorretta Harp, MD;  Location: [facility] CATH LAB;  Service: Cardiovascular;;  . ANGIOPLASTY Right 01/08/2013   unsucess stenting RLE   . APPENDECTOMY    . BREAST LUMPECTOMY WITH RADIOACTIVE SEED LOCALIZATION Left 08/23/2017   Procedure: BREAST LUMPECTOMY WITH RADIOACTIVE SEED LOCALIZATION;  Surgeon: Erroll Luna, MD;  Location: [place];  Service: General;  Laterality: Left;  . CARDIAC CATHETERIZATION  11/2008   diffuse ISR in  RCA w/ good L-R collaterals; AV groove Cx - 60-70% stenosis with post stenosis ectasia;  EF 55%, mild  CAD in LAD -- Med Rx  . CATARACT EXTRACTION W/ INTRAOCULAR LENS  IMPLANT, BILATERAL Bilateral   . CHOLECYSTECTOMY    . CORONARY ANGIOPLASTY  April 2006   Cutting Balloon PTCA of RCA ISR  . CORONARY ANGIOPLASTY WITH STENT PLACEMENT  Jan 2004   RCA - 2 overlapping Pixel BMS 2.0 mm x 18 mm & 2.0 mm x 23 m   . DILATION AND CURETTAGE OF UTERUS     "couple over the years" (01/08/2013)  . ILIAC ARTERY STENT Left 12/30/2005   PTA anddirect stenting  of left common iliac artery - 8.0 x 18 mm OmniLink stent  . ILIAC ARTERY STENT Right 02/13/2013   successful diamondback orbital rotation lipectomy, PTA and stenting of highly calcified ostial common iliac artery/notes 02/13/2013  . LEFT HEART CATHETERIZATION WITH CORONARY ANGIOGRAM N/A 11/13/2013   Procedure: LEFT HEART CATHETERIZATION WITH CORONARY ANGIOGRAM;  Surgeon: Leonie Man, MD; LAD 40%, CFX 60%, OM1 60%, RCA diffuse in-stent restenosis up to 99%, small PDA and PLA, collaterals from left system noted, EF 55-60 percent  . LOWER EXTREMITY ANGIOGRAM N/A 01/08/2013   Procedure: LOWER EXTREMITY ANGIOGRAM;  Surgeon: Lorretta Harp, MD;  Location: [facility] CATH LAB;   Service: Cardiovascular;  Laterality: N/A;  . NM MYOCAR PERF WALL MOTION  12/27/2011 -[place]   LV normal,EF 81 %  . NM MYOCAR PERF WALL MOTION  01/2015   EF 56%. Normal ROM of low risk. Small defect of mild severity and apical anterior location likely related to breast attenuation. Cannot rule out small area of mild ischemia.  . TRANSTHORACIC ECHOCARDIOGRAM  10/2013   Normal LV size low normal function. EF 50-55%. No regional wall motion abnormality. GR 1 DD. PAP ~ 47 mmHg     Current Meds  Medication Sig  . ALPRAZolam (XANAX) 0.5 MG tablet Take 0.5 mg by mouth daily as needed for anxiety.  [name] amLODipine (NORVASC) 2.5 MG tablet TAKE 10 MG TABLET BY MOUTH EVERY DAY  . Cholecalciferol (VITAMIN D-3) 5000 units TABS Take 5,000 Units by mouth daily.  [name] levothyroxine (SYNTHROID, LEVOTHROID) 100 MCG tablet Take 100 mcg by mouth daily.  . nitroGLYCERIN (NITROSTAT) 0.4 MG SL tablet  Place 1 tablet (0.4 mg total) under the tongue every 5 (five) minutes as needed for chest pain.  . Omega-3 Fatty Acids (FISH OIL) 1000 MG CAPS Take 1,000 mg by mouth daily.   Vladimir Faster Glycol-Propyl Glycol (LUBRICANT EYE DROPS) 0.4-0.3 % SOLN Place 1 drop into both eyes daily.  . ranitidine (ZANTAC) 150 MG tablet Take 150 mg by mouth 2 (two) times daily as needed (FOR HEARTBURN/INDIGESTION/ACID REFLUX.).   [name] sertraline (ZOLOFT) 100 MG tablet Take 100 mg by mouth every evening.  . trandolapril (MAVIK) 2 MG tablet TAKE 1 TABLET EVERY DAY  . [DISCONTINUED] amLODipine (NORVASC) 2.5 MG tablet TAKE 1 TABLET EVERY DAY  NEED MD APPOINTMENT FOR REFILLS     Allergies:   Avastin [bevacizumab], Benadryl [diphenhydramine], Hydrocodone-acetaminophen, Levofloxacin, Statins, Trazodone, and Beta adrenergic blockers   Social History   Tobacco Use  . Smoking status: Former Smoker    Packs/day: 1.00    Years: 40.00    Pack years: 40.00    Types: Cigarettes    Quit date: 09/18/1987    Years since quitting: 31.1  . Smokeless  tobacco: Never Used  Substance Use Topics  . Alcohol use: No  . Drug use: No     Family Hx: The patient's family history includes Diabetes in her father. There is no history of Breast [name] or Ovarian [name].   Prior CV studies:   The following studies were reviewed today: . none  Labs/Other Tests and Data Reviewed:    EKG:  No ECG reviewed.  Recent Labs: 04/09/2018: ALT 16; BUN 16; Creatinine, Ser 0.91; Hemoglobin 14.5; Platelets 176; Potassium 3.8; Sodium 137   Recent Lipid Panel No recent lipids  Wt Readings from Last 3 Encounters:  10/19/18 145 lb (65.8 kg)  04/09/18 146 lb (66.2 kg)  02/21/18 145 lb 12.8 oz (66.1 kg)     Objective:    Vital Signs:  Ht 5\' 4"  (1.626 m)   Wt 145 lb (65.8 kg)   BMI 24.89 kg/m   VITAL SIGNS:  reviewed GEN:  no acute distress RESPIRATORY:  non-labored NEURO:  A&O x 3.  answers questions appropriately PSYCH:  normal affect   ASSESSMENT & PLAN:    Problem List Items Addressed This Visit    Hyperlipidemia with target LDL less than 70 (Chronic)    We stopped atorvastatin due to fatigue, and she does not want to restart medication based on her age.      Relevant Medications   amLODipine (NORVASC) 2.5 MG tablet   Essential hypertension - With permissive HTN (Chronic)    Unfortunately, we do not have blood pressure on her today but she is on amlodipine and ACE inhibitor.  We are allowing for mild permissive hypertension.      Relevant Medications   amLODipine (NORVASC) 2.5 MG tablet   Claudication in peripheral vascular disease (HCC) (Chronic)    Seems to be doing okay.  Has not been seen by Dr. Gwenlyn Found in over 3 years.  She does not seem to be complaining about any claudication symptoms.  For now would hold off on further evaluation including AAA evaluation.      CAD S/P percutaneous coronary angioplasty - Primary (Chronic)    Doing well from a cardiology standpoint.  No real active anginal symptoms.  Last cath was in July 2015  -is noted diffuse in-stent restenosis in the RCA (essentially occluded) as well as 60% circumflex and OM1 disease.   Plan to treat medical management. --Follow-up Myoview was  low risk  Continue amlodipine as antianginal and blood pressure medication  She indicates that she is taking an aspirin, but not listed.  She is reluctant to take statins, and has not been on a beta-blocker due to fatigue.       Relevant Medications   amLODipine (NORVASC) 2.5 MG tablet   Abdominal aortic aneurysm (HCC) (Chronic)   Relevant Medications   amLODipine (NORVASC) 2.5 MG tablet      COVID-19 Education: The signs and symptoms of COVID-19 were discussed with the patient and how to seek care for testing (follow up with PCP or arrange E-visit).   The importance of social distancing was discussed today.  Time:   Today, I have spent 10 minutes with the patient with telehealth technology discussing the above problems.  + 5-10 min documenting & chart review.   Medication Adjustments/Labs and Tests Ordered: Current medicines are reviewed at length with the patient today.  Concerns regarding medicines are outlined above.  Medication Instructions: none  Tests Ordered: No orders of the defined types were placed in this encounter. none  Medication Changes: Meds ordered this encounter  Medications  . amLODipine (NORVASC) 2.5 MG tablet    Sig: TAKE 10 MG TABLET BY MOUTH EVERY DAY    Dispense:  90 tablet    Refill:  3    Disposition:  Follow up in 8 month(s)    Signed, Glenetta Hew, MD  10/21/2018 3:45 PM    [facility]

## 2018-10-21 NOTE — Assessment & Plan Note (Signed)
Unfortunately, we do not have blood pressure on her today but she is on amlodipine and ACE inhibitor.  We are allowing for mild permissive hypertension.

## 2018-10-21 NOTE — Assessment & Plan Note (Signed)
Seems to be doing okay.  Has not been seen by Dr. Gwenlyn Found in over 3 years.  She does not seem to be complaining about any claudication symptoms.  For now would hold off on further evaluation including AAA evaluation.

## 2018-10-21 NOTE — Assessment & Plan Note (Signed)
We stopped atorvastatin due to fatigue, and she does not want to restart medication based on her age.

## 2018-10-21 NOTE — Assessment & Plan Note (Addendum)
Doing well from a cardiology standpoint.  No real active anginal symptoms.  Last cath was in July 2015 -is noted diffuse in-stent restenosis in the RCA (essentially occluded) as well as 60% circumflex and OM1 disease.   Plan to treat medical management. --Follow-up Myoview was low risk  Continue amlodipine as antianginal and blood pressure medication  She indicates that she is taking an aspirin, but not listed.  She is reluctant to take statins, and has not been on a beta-blocker due to fatigue.

## 2018-10-23 ENCOUNTER — Other Ambulatory Visit: Payer: Self-pay | Admitting: Cardiology

## 2018-10-23 NOTE — Telephone Encounter (Signed)
°*  STAT* If patient is at the pharmacy, call can be transferred to refill team.   1. Which medications need to be refilled? (please list name of each medication and dose if known)  amLODipine (NORVASC) 2.5 MG tablet  2. Which pharmacy/location (including street and city if local pharmacy) is medication to be sent to? [place], [place]  3. Do they need a 30 day or 90 day supply? 6     Daughter of patient states that the patient has not received this RX yet. She contacted [employer], and [employer] states they are waiting on dosage clarification from Dr. Ellyn Hack.  Daughter states that the patient takes 2.5 mg daily, and has taken the same dosage for 10 years.   The medication list on the patient's chart state that the patient is to take a 10 mg tablet by mouth daily.  The daughter states she only has a few days left of medication

## 2018-10-24 NOTE — Telephone Encounter (Signed)
Daughter states that the patient takes 2.5 mg daily, and has taken the same dosage for 10 years.   The medication list on the patient's chart state that the patient is to take a 10 mg tablet by mouth daily

## 2018-10-24 NOTE — Telephone Encounter (Signed)
Planned Rx is 2.5 mg daily.  pls change to Amlodipine 2.5 mg tab, 1 tab PO daily. Disp #90 3 Refill  Glenetta Hew, MD

## 2018-10-25 MED ORDER — AMLODIPINE BESYLATE 2.5 MG PO TABS: 2.5000 mg | ORAL_TABLET | Freq: Every day | ORAL | 3 refills | Status: AC

## 2018-10-25 NOTE — Telephone Encounter (Signed)
Refill sent as ordered Left detailed message as noted for patient/Linda Carlisle(daughter)( (DPR), call back w/any questions

## 2018-11-22 ENCOUNTER — Other Ambulatory Visit: Payer: Self-pay | Admitting: Cardiology

## 2018-11-23 ENCOUNTER — Other Ambulatory Visit: Payer: Self-pay | Admitting: Cardiology

## 2018-11-23 MED ORDER — TRANDOLAPRIL 2 MG PO TABS: 2.0000 mg | ORAL_TABLET | Freq: Every day | ORAL | 1 refills | Status: DC

## 2018-11-27 ENCOUNTER — Other Ambulatory Visit: Payer: Self-pay | Admitting: Cardiology

## 2018-11-27 MED ORDER — TRANDOLAPRIL 2 MG PO TABS: 2.0000 mg | ORAL_TABLET | Freq: Every day | ORAL | 3 refills | Status: DC

## 2018-11-27 NOTE — Telephone Encounter (Signed)
Rx(s) sent to pharmacy electronically.  

## 2018-11-27 NOTE — Telephone Encounter (Signed)
°*  STAT* If patient is at the pharmacy, call can be transferred to refill team.   1. Which medications need to be refilled? (please list name of each medication and dose if known) Trandolapril 2 mg  2. Which pharmacy/location (including street and city if local pharmacy) is medication to be sent to [place]   o they need a 30 day or 90 day supply? 90 amd refills

## 2018-11-28 ENCOUNTER — Other Ambulatory Visit: Payer: Self-pay

## 2018-11-28 ENCOUNTER — Other Ambulatory Visit: Payer: Self-pay | Admitting: Cardiology

## 2018-11-28 MED ORDER — TRANDOLAPRIL 2 MG PO TABS: 2.0000 mg | ORAL_TABLET | Freq: Every day | ORAL | 3 refills | Status: DC

## 2018-11-28 MED ORDER — TRANDOLAPRIL 2 MG PO TABS: 2.0000 mg | ORAL_TABLET | Freq: Every day | ORAL | 0 refills | Status: AC

## 2018-11-28 NOTE — Telephone Encounter (Signed)
 *  STAT* If patient is at the pharmacy, call can be transferred to refill team.   1. Which medications need to be refilled? (please list name of each medication and dose if known) trandolapril (MAVIK) 2 MG tablet  2. Which pharmacy/location (including street and city if local pharmacy) is medication to be sent to? [facility] [place]  3. Do they need a 30 day or 90 day supply? 30  Patient is completely out and needs enough to get her through until her prescription comes from [facility]. Would like in next 2-3 hours if possible.

## 2018-11-28 NOTE — Telephone Encounter (Signed)
Follow Up:    Pt's son is calling to find out the status of her prescription. She needs this right away. Please let the son know something asap.

## 2019-01-25 ENCOUNTER — Telehealth: Payer: Self-pay | Admitting: Oncology

## 2019-01-25 NOTE — Telephone Encounter (Signed)
Returned patient's phone call regarding rescheduling an appointment, per patient's daughter is requesting May 2020 appointment to be rescheduled for sooner. Informed her I will get back in touch with her once provider gives me further instructions.

## 2019-01-26 ENCOUNTER — Telehealth: Payer: Self-pay | Admitting: Oncology

## 2019-01-26 NOTE — Telephone Encounter (Signed)
Called patient to reschedule May appointment per patient's request, appointment has moved to November. MD approved.

## 2019-02-08 ENCOUNTER — Emergency Department (HOSPITAL_COMMUNITY): Payer: Medicare HMO

## 2019-02-08 ENCOUNTER — Encounter (HOSPITAL_COMMUNITY): Payer: Self-pay | Admitting: Emergency Medicine

## 2019-02-08 ENCOUNTER — Observation Stay (HOSPITAL_COMMUNITY)
Admission: EM | Admit: 2019-02-08 | Discharge: 2019-02-09 | Disposition: A | Discharge status: Home / Self Care | Payer: Medicare HMO | Attending: Internal Medicine | Admitting: Internal Medicine

## 2019-02-08 ENCOUNTER — Other Ambulatory Visit: Payer: Self-pay

## 2019-02-08 DIAGNOSIS — N390 Urinary tract infection, site not specified: Secondary | ICD-10-CM | POA: Diagnosis not present

## 2019-02-08 DIAGNOSIS — Z20828 Contact with and (suspected) exposure to other viral communicable diseases: Secondary | ICD-10-CM | POA: Insufficient documentation

## 2019-02-08 DIAGNOSIS — E785 Hyperlipidemia, unspecified: Secondary | ICD-10-CM | POA: Insufficient documentation

## 2019-02-08 DIAGNOSIS — I739 Peripheral vascular disease, unspecified: Secondary | ICD-10-CM | POA: Diagnosis not present

## 2019-02-08 DIAGNOSIS — I5032 Chronic diastolic (congestive) heart failure: Secondary | ICD-10-CM | POA: Diagnosis not present

## 2019-02-08 DIAGNOSIS — I714 Abdominal aortic aneurysm, without rupture: Secondary | ICD-10-CM | POA: Insufficient documentation

## 2019-02-08 DIAGNOSIS — E039 Hypothyroidism, unspecified: Secondary | ICD-10-CM | POA: Diagnosis present

## 2019-02-08 DIAGNOSIS — R4182 Altered mental status, unspecified: Secondary | ICD-10-CM | POA: Diagnosis present

## 2019-02-08 DIAGNOSIS — I6381 Other cerebral infarction due to occlusion or stenosis of small artery: Secondary | ICD-10-CM | POA: Diagnosis not present

## 2019-02-08 DIAGNOSIS — F419 Anxiety disorder, unspecified: Secondary | ICD-10-CM | POA: Diagnosis not present

## 2019-02-08 DIAGNOSIS — I110 Hypertensive heart disease with heart failure: Secondary | ICD-10-CM | POA: Diagnosis not present

## 2019-02-08 DIAGNOSIS — I10 Essential (primary) hypertension: Secondary | ICD-10-CM | POA: Diagnosis present

## 2019-02-08 DIAGNOSIS — Z170 Estrogen receptor positive status [ER+]: Secondary | ICD-10-CM

## 2019-02-08 DIAGNOSIS — G92 Toxic encephalopathy: Secondary | ICD-10-CM | POA: Diagnosis not present

## 2019-02-08 DIAGNOSIS — Z79890 Hormone replacement therapy (postmenopausal): Secondary | ICD-10-CM | POA: Diagnosis not present

## 2019-02-08 DIAGNOSIS — M6281 Muscle weakness (generalized): Secondary | ICD-10-CM | POA: Diagnosis not present

## 2019-02-08 DIAGNOSIS — Z87891 Personal history of nicotine dependence: Secondary | ICD-10-CM | POA: Diagnosis not present

## 2019-02-08 DIAGNOSIS — I252 Old myocardial infarction: Secondary | ICD-10-CM | POA: Diagnosis not present

## 2019-02-08 DIAGNOSIS — C50412 Malignant neoplasm of upper-outer quadrant of left female breast: Secondary | ICD-10-CM | POA: Diagnosis not present

## 2019-02-08 DIAGNOSIS — I2510 Atherosclerotic heart disease of native coronary artery without angina pectoris: Secondary | ICD-10-CM

## 2019-02-08 DIAGNOSIS — F329 Major depressive disorder, single episode, unspecified: Secondary | ICD-10-CM | POA: Insufficient documentation

## 2019-02-08 DIAGNOSIS — K219 Gastro-esophageal reflux disease without esophagitis: Secondary | ICD-10-CM | POA: Diagnosis not present

## 2019-02-08 DIAGNOSIS — Z955 Presence of coronary angioplasty implant and graft: Secondary | ICD-10-CM | POA: Diagnosis not present

## 2019-02-08 DIAGNOSIS — Z79899 Other long term (current) drug therapy: Secondary | ICD-10-CM | POA: Insufficient documentation

## 2019-02-08 DIAGNOSIS — Z9861 Coronary angioplasty status: Secondary | ICD-10-CM

## 2019-02-08 DIAGNOSIS — G9340 Encephalopathy, unspecified: Secondary | ICD-10-CM | POA: Diagnosis present

## 2019-02-08 LAB — CBC WITH DIFFERENTIAL/PLATELET
Abs Immature Granulocytes: 0.02 10*3/uL (ref 0.00–0.07)
Basophils Absolute: 0 10*3/uL (ref 0.0–0.1)
Basophils Relative: 0 %
Eosinophils Absolute: 0.2 10*3/uL (ref 0.0–0.5)
Eosinophils Relative: 2 %
HCT: 40.5 % (ref 36.0–46.0)
Hemoglobin: 13 g/dL (ref 12.0–15.0)
Immature Granulocytes: 0 %
Lymphocytes Relative: 27 %
Lymphs Abs: 1.7 10*3/uL (ref 0.7–4.0)
MCH: 32.4 pg (ref 26.0–34.0)
MCHC: 32.1 g/dL (ref 30.0–36.0)
MCV: 101 fL — ABNORMAL HIGH (ref 80.0–100.0)
Monocytes Absolute: 0.6 10*3/uL (ref 0.1–1.0)
Monocytes Relative: 10 %
Neutro Abs: 3.7 10*3/uL (ref 1.7–7.7)
Neutrophils Relative %: 61 %
Platelets: 164 10*3/uL (ref 150–400)
RBC: 4.01 MIL/uL (ref 3.87–5.11)
RDW: 12.4 % (ref 11.5–15.5)
WBC: 6.2 10*3/uL (ref 4.0–10.5)
nRBC: 0 % (ref 0.0–0.2)

## 2019-02-08 LAB — I-STAT CHEM 8, ED
BUN: 25 mg/dL — ABNORMAL HIGH (ref 8–23)
Calcium, Ion: 1.16 mmol/L (ref 1.15–1.40)
Chloride: 108 mmol/L (ref 98–111)
Creatinine, Ser: 0.6 mg/dL (ref 0.44–1.00)
Glucose, Bld: 96 mg/dL (ref 70–99)
HCT: 39 % (ref 36.0–46.0)
Hemoglobin: 13.3 g/dL (ref 12.0–15.0)
Potassium: 4.1 mmol/L (ref 3.5–5.1)
Sodium: 141 mmol/L (ref 135–145)
TCO2: 23 mmol/L (ref 22–32)

## 2019-02-08 LAB — LACTIC ACID, PLASMA: Lactic Acid, Venous: 1.2 mmol/L (ref 0.5–1.9)

## 2019-02-08 LAB — RAPID URINE DRUG SCREEN, HOSP PERFORMED
Amphetamines: NOT DETECTED
Barbiturates: NOT DETECTED
Benzodiazepines: POSITIVE — AB
Cocaine: NOT DETECTED
Opiates: NOT DETECTED
Tetrahydrocannabinol: NOT DETECTED

## 2019-02-08 LAB — COMPREHENSIVE METABOLIC PANEL
ALT: 20 U/L (ref 0–44)
AST: 27 U/L (ref 15–41)
Albumin: 3.9 g/dL (ref 3.5–5.0)
Alkaline Phosphatase: 72 U/L (ref 38–126)
Anion gap: 11 (ref 5–15)
BUN: 19 mg/dL (ref 8–23)
CO2: 21 mmol/L — ABNORMAL LOW (ref 22–32)
Calcium: 9.1 mg/dL (ref 8.9–10.3)
Chloride: 108 mmol/L (ref 98–111)
Creatinine, Ser: 0.67 mg/dL (ref 0.44–1.00)
GFR calc Af Amer: >60 mL/min (ref >60)
GFR calc non Af Amer: >60 mL/min (ref >60)
Glucose, Bld: 100 mg/dL — ABNORMAL HIGH (ref 70–99)
Potassium: 4.1 mmol/L (ref 3.5–5.1)
Sodium: 140 mmol/L (ref 135–145)
Total Bilirubin: 0.8 mg/dL (ref 0.3–1.2)
Total Protein: 6.2 g/dL — ABNORMAL LOW (ref 6.5–8.1)

## 2019-02-08 LAB — URINALYSIS, COMPLETE (UACMP) WITH MICROSCOPIC
Bilirubin Urine: NEGATIVE
Glucose, UA: NEGATIVE mg/dL
Hgb urine dipstick: NEGATIVE
Ketones, ur: NEGATIVE mg/dL
Nitrite: NEGATIVE
Protein, ur: NEGATIVE mg/dL
Specific Gravity, Urine: 1.019 (ref 1.005–1.030)
WBC, UA: >50 WBC/hpf — ABNORMAL HIGH (ref 0–5)
pH: 5 (ref 5.0–8.0)

## 2019-02-08 LAB — PROTIME-INR
INR: 1 (ref 0.8–1.2)
Prothrombin Time: 13.3 seconds (ref 11.4–15.2)

## 2019-02-08 LAB — CBG MONITORING, ED: Glucose-Capillary: 89 mg/dL (ref 70–99)

## 2019-02-08 LAB — TROPONIN I (HIGH SENSITIVITY)
Troponin I (High Sensitivity): 6 ng/L (ref <18)
Troponin I (High Sensitivity): 6 ng/L (ref <18)

## 2019-02-08 LAB — ETHANOL: Alcohol, Ethyl (B): <10 mg/dL (ref <10)

## 2019-02-08 MED ORDER — SODIUM CHLORIDE 0.9 % IV SOLN
1.0000 g | Freq: Once | INTRAVENOUS | Status: AC
  Administered 2019-02-08: 1 g via INTRAVENOUS
  Filled 2019-02-08: qty 10

## 2019-02-08 NOTE — ED Provider Notes (Signed)
[facility] EMERGENCY DEPARTMENT Provider Note   CSN: [id number] Arrival date & time: 02/08/19  1944     History   Chief Complaint Chief Complaint  Patient presents with  . Altered Mental Status    HPI  [name] is a 85 y.o. female presented to emergency department altered mental status.  History is provided largely by EMS initially and subsequently by the patient's daughter-in-law.  Her daughter-in-law reports that the patient was in her usual state of health this morning when they were talking on the phone.  Subsequently the patient's neighbor knocked on her door to visit her in the afternoon, and found the patient was not responding.  The neighbor let her self into the apartment and found the patient lying on the ground.  The patient's lunch which was a bowl of broccoli was found carefully placed next to her on the ground.  Patient was awake at that time.  Had a difficulty getting her off the ground.  The neighbor felt like her speech was slurred at that point.  They noted the patient's gait seemed off and was wide-based and unsteady.  They called the family members who agreed that her speech seemed off, "garbled."  They called EMS who brought her into the emergency department.  The patient does not recall any other events preceding this.  The patient is unsure why she was on the ground today.  She does not remember what happened earlier today.  On arrival she is denying any symptoms, including pain.  She denies headache.  She states that she feels groggy.  EMS reports that they found the patient had a bottle of Xanax next to her.  These were prescribed to her for sleep.  They state that she was recently prescribed 30 tablets on 10/12, and 16 tablets were missing.  They denied any other medications missing or alcohol nearby.       HPI  Past Medical History:  Diagnosis Date  . Abdominal aortic aneurysm ([place])    asymptomatic, 2012 -  2.6 x 2.4cm  . Anxiety   .  CAD S/P percutaneous coronary angioplasty    Cath 2010: 100% occlusion of RCA  (2 BMS stents finally occluded) w/ left to right collaterals; moderate LAD and circumflex disease.  EF 55-60%;; cath 2015 with medical therapy recommended   . Chronic lower back pain   . Daily headache       . Depression   . Dyspnea    on exertion sometimes  . Exertional shortness of breath   . GERD (gastroesophageal reflux disease)   . History of kidney stones   . History of stomach ulcers   . Hyperlipidemia   . Hypertension   . Hypothyroidism   . Macular degeneration    "started out dry; now is wet" (01/08/2013)  . Migraines       . Myocardial infarction ([place]) 2003  . PAD (peripheral artery disease) ([place]) 12/2005   s/p L Common Iliac Stent  . PONV (postoperative nausea and vomiting)   . Walking pneumonia 1970's    Patient Active Problem List   Diagnosis Date Noted  . Acute encephalopathy 02/08/2019  . Abdominal pain 02/21/2018  . Malignant neoplasm of upper-outer quadrant of left breast in female, estrogen receptor positive ([place]) 07/12/2017  . Orthostatic hypotension 03/12/2015  . Claudication in peripheral vascular disease ([place]) 02/14/2013  . GERD (gastroesophageal reflux disease) 11/25/2012  . PAD - s/p diamondback orbital rotation atherectomy, PTA + stenting of  calcified ostical right CIA- 02/13/13   . CAD S/P percutaneous coronary angioplasty   . Abdominal aortic aneurysm ([place])   . PULMONARY NODULE 09/10/2008  . HYPOTHYROIDISM 01/16/2007  . Hyperlipidemia with target LDL less than 70 01/16/2007  . ANXIETY 01/16/2007  . Essential hypertension - With permissive HTN 01/16/2007  . Chronic diastolic heart failure, NYHA class 1 ([place]) 01/16/2007  . MENOPAUSAL SYNDROME 01/16/2007    Past Surgical History:  Procedure Laterality Date  . ABD AOTRA DOPPLER  08/14/2012   distal mild dilatation 2.6 x 2.5cm unchanged;right cia 70-99%  new finding  . Abdominal Aorta Duplex  09/2014   No change from  prior study; 2.6 x 2.4 cm mild AAA. R Com Iliac A  > 50%. -- f/u 24 months.  . ABDOMINAL AORTAGRAM  01/08/2013   Procedure: ABDOMINAL Maxcine Ham;  Surgeon: Lorretta Harp, MD;  Location: [facility] CATH LAB;  Service: Cardiovascular;;  . ANGIOPLASTY Right 01/08/2013   unsucess stenting RLE   . APPENDECTOMY    . BREAST LUMPECTOMY WITH RADIOACTIVE SEED LOCALIZATION Left 08/23/2017   Procedure: BREAST LUMPECTOMY WITH RADIOACTIVE SEED LOCALIZATION;  Surgeon: Erroll Luna, MD;  Location: [facility];  Service: General;  Laterality: Left;  . CARDIAC CATHETERIZATION  11/2008   diffuse ISR in  RCA w/ good L-R collaterals; AV groove Cx - 60-70% stenosis with post stenosis ectasia;  EF 55%, mild  CAD in LAD -- Med Rx  . CATARACT EXTRACTION W/ INTRAOCULAR LENS  IMPLANT, BILATERAL Bilateral   . CHOLECYSTECTOMY    . CORONARY ANGIOPLASTY  April 2006   Cutting Balloon PTCA of RCA ISR  . CORONARY ANGIOPLASTY WITH STENT PLACEMENT  Jan 2004   RCA - 2 overlapping Pixel BMS 2.0 mm x 18 mm & 2.0 mm x 23 m   . DILATION AND CURETTAGE OF UTERUS     "couple over the years" (01/08/2013)  . ILIAC ARTERY STENT Left 12/30/2005   PTA anddirect stenting  of left common iliac artery - 8.0 x 18 mm OmniLink stent  . ILIAC ARTERY STENT Right 02/13/2013   successful diamondback orbital rotation lipectomy, PTA and stenting of highly calcified ostial common iliac artery/notes 02/13/2013  . LEFT HEART CATHETERIZATION WITH CORONARY ANGIOGRAM N/A 11/13/2013   Procedure: LEFT HEART CATHETERIZATION WITH CORONARY ANGIOGRAM;  Surgeon: Leonie Man, MD; LAD 40%, CFX 60%, OM1 60%, RCA diffuse in-stent restenosis up to 99%, small PDA and PLA, collaterals from left system noted, EF 55-60 percent  . LOWER EXTREMITY ANGIOGRAM N/A 01/08/2013   Procedure: LOWER EXTREMITY ANGIOGRAM;  Surgeon: Lorretta Harp, MD;  Location: [facility] CATH LAB;  Service: Cardiovascular;  Laterality: N/A;  . NM MYOCAR PERF WALL MOTION  12/27/2011 -[place]   LV normal,EF 81 %   . NM MYOCAR PERF WALL MOTION  01/2015   EF 56%. Normal ROM of low risk. Small defect of mild severity and apical anterior location likely related to breast attenuation. Cannot rule out small area of mild ischemia.  . TRANSTHORACIC ECHOCARDIOGRAM  10/2013   Normal LV size low normal function. EF 50-55%. No regional wall motion abnormality. GR 1 DD. PAP ~ 47 mmHg     OB History   No obstetric history on file.      Home Medications    Prior to Admission medications   Medication Sig Start Date End Date Taking? Authorizing Provider  ALPRAZolam Duanne Moron) 1 MG tablet Take 1 mg by mouth at bedtime.   Yes [provider]  amLODipine (NORVASC) 2.5  MG tablet Take 1 tablet (2.5 mg total) by mouth daily. 10/25/18  Yes Leonie Man, MD  Cholecalciferol (VITAMIN D-3) 5000 units TABS Take 5,000 Units by mouth daily.   Yes [provider]  famotidine (PEPCID AC MAXIMUM STRENGTH) 20 MG tablet Take 20 mg by mouth daily as needed (when Omeprazole isn't controlling heartburn or ingestion symptoms).    Yes [provider]  ibuprofen (ADVIL) 200 MG tablet Take 400 mg by mouth every 6 (six) hours as needed (for back pain).   Yes [provider]  levothyroxine (SYNTHROID, LEVOTHROID) 100 MCG tablet Take 100 mcg by mouth daily before breakfast.  10/29/14  Yes [provider]  lovastatin (MEVACOR) 20 MG tablet Take 20 mg by mouth at bedtime.  01/29/19  Yes [provider]  nitroGLYCERIN (NITROSTAT) 0.4 MG SL tablet Place 1 tablet (0.4 mg total) under the tongue every 5 (five) minutes as needed for chest pain. 02/11/16  Yes Leonie Man, MD  Omega-3 Fatty Acids (FISH OIL) 1000 MG CAPS Take 1,000 mg by mouth daily.    Yes [provider]  omeprazole (PRILOSEC) 40 MG capsule Take 40 mg by mouth daily before breakfast. 11/16/18  Yes [provider]  Polyethyl Glycol-Propyl Glycol (LUBRICANT EYE DROPS) 0.4-0.3 % SOLN Place 1 drop into both eyes as  needed (for dryness).    Yes [provider]  sertraline (ZOLOFT) 100 MG tablet Take 100 mg by mouth daily.  05/19/17  Yes [provider]  trandolapril (MAVIK) 2 MG tablet Take 1 tablet (2 mg total) by mouth daily. 11/28/18  Yes Leonie Man, MD    Family History Family History  Problem Relation Age of Onset  . Diabetes Father   . Breast cancer Neg Hx   . Ovarian cancer Neg Hx     Social History Social History   Tobacco Use  . Smoking status: Former Smoker    Packs/day: 1.00    Years: 40.00    Pack years: 40.00    Types: Cigarettes    Quit date: 09/18/1987    Years since quitting: 31.4  . Smokeless tobacco: Never Used  Substance Use Topics  . Alcohol use: No  . Drug use: No     Allergies   Avastin [bevacizumab], Benadryl [diphenhydramine], Hydrocodone-acetaminophen, Levofloxacin, Statins, Beta adrenergic blockers, and Trazodone   Review of Systems Review of Systems  Constitutional: Negative for chills and fever.  Respiratory: Negative for cough and shortness of breath.   Cardiovascular: Negative for chest pain and palpitations.  Gastrointestinal: Negative for nausea and vomiting.  Musculoskeletal: Negative for back pain and myalgias.  Skin: Negative for color change and rash.  Neurological: Negative for syncope and headaches.  All other systems reviewed and are negative.    Physical Exam Updated Vital Signs BP (!) 159/96 (BP Location: Left Arm)   Pulse 66   Temp (!) 97.4 F (36.3 C) (Oral)   Resp 17   Ht 5\' 4"  (1.626 m)   Wt 63.5 kg   SpO2 100%   BMI 24.03 kg/m   Physical Exam Vitals signs and nursing note reviewed.  Constitutional:      General: She is not in acute distress.    Appearance: She is well-developed.  HENT:     Head: Normocephalic and atraumatic.  Eyes:     Conjunctiva/sclera: Conjunctivae normal.  Neck:     Musculoskeletal: Neck supple.  Cardiovascular:     Rate and Rhythm: Normal rate and regular rhythm.  Pulses: Normal pulses.  Pulmonary:     Effort: Pulmonary effort is normal. No respiratory distress.     Breath sounds: Normal breath sounds.  Abdominal:     General: There is no distension.     Palpations: Abdomen is soft. There is no mass or pulsatile mass.     Tenderness: There is no abdominal tenderness.  Skin:    General: Skin is warm and dry.  Neurological:     Mental Status: She is alert.     GCS: GCS eye subscore is 4. GCS verbal subscore is 5. GCS motor subscore is 6.     Cranial Nerves: Cranial nerves are intact.     Sensory: Sensation is intact.     Motor: Motor function is intact. No pronator drift.     Comments: Mildly garbled speech      ED Treatments / Results  Labs (all labs ordered are listed, but only abnormal results are displayed) Labs Reviewed  COMPREHENSIVE METABOLIC PANEL - Abnormal; Notable for the following components:      Result Value   CO2 21 (*)    Glucose, Bld 100 (*)    Total Protein 6.2 (*)    All other components within normal limits  CBC WITH DIFFERENTIAL/PLATELET - Abnormal; Notable for the following components:   MCV 101.0 (*)    All other components within normal limits  URINALYSIS, COMPLETE (UACMP) WITH MICROSCOPIC - Abnormal; Notable for the following components:   APPearance HAZY (*)    Leukocytes,Ua LARGE (*)    WBC, UA >50 (*)    Bacteria, UA FEW (*)    Non Squamous Epithelial 0-5 (*)    All other components within normal limits  RAPID URINE DRUG SCREEN, HOSP PERFORMED - Abnormal; Notable for the following components:   Benzodiazepines POSITIVE (*)    All other components within normal limits  I-STAT CHEM 8, ED - Abnormal; Notable for the following components:   BUN 25 (*)    All other components within normal limits  SARS CORONAVIRUS 2 (TAT 6-24 HRS)  URINE CULTURE  LACTIC ACID, PLASMA  ETHANOL  PROTIME-INR  CBG MONITORING, ED  CBG MONITORING, ED  TROPONIN I (HIGH SENSITIVITY)  TROPONIN I (HIGH SENSITIVITY)    EKG  EKG Interpretation  Date/Time:  Thursday February 08 2019 19:45:32 EDT Ventricular Rate:  73 PR Interval:    QRS Duration: 86 QT Interval:  389 QTC Calculation: 429 R Axis:   56 Text Interpretation:  Sinus rhythm No STEMI  Confirmed by Octaviano Glow [phone]) on 02/08/2019 7:48:14 PM   Radiology Ct Head Wo Contrast  Result Date: 02/08/2019 CLINICAL DATA:  Altered level of consciousness EXAM: CT HEAD WITHOUT CONTRAST TECHNIQUE: Contiguous axial images were obtained from the base of the skull through the vertex without intravenous contrast. COMPARISON:  Sep 04, 2004, MR head November 16, 2008. FINDINGS: Brain: No evidence of acute territorial infarction, hemorrhage, hydrocephalus,extra-axial collection or mass lesion/mass effect. There is dilatation the ventricles and sulci consistent with age-related atrophy. Low-attenuation changes in the deep white matter consistent with small vessel ischemia. Lacunar infarct is again noted within the right internal capsule and left basal ganglia. Vascular: No hyperdense vessel or unexpected calcification. Skull: The skull is intact. No fracture or focal lesion identified. Sinuses/Orbits: The visualized paranasal sinuses and mastoid air cells are clear. The orbits and globes intact. Other: None IMPRESSION: No acute intracranial abnormality. Findings consistent with age related atrophy and chronic small vessel ischemia Bilateral lacunar infarcts. Electronically Signed  By: Prudencio Pair M.D.   On: 02/08/2019 20:23    Procedures Procedures (including critical care time)  Medications Ordered in ED Medications  cefTRIAXone (ROCEPHIN) 1 g in sodium chloride 0.9 % 100 mL IVPB (0 g Intravenous Stopped 02/09/19 0005)     Initial Impression / Assessment and Plan / ED Course  I have reviewed the triage vital signs and the nursing notes.  Pertinent labs & imaging results that were available during my care of the patient were reviewed by me and considered in my  medical decision making (see chart for details).  85 yo female presenting with AMS x 1 day, found on the ground at home by her neighbor.  Family had some concerns for garbled speech and difficulty with gait.  Patient appears tired on exam, she does not appear to have expressive aphasia to me.   Patient has hx of AAA but no abdominal pain and equal pulses, this is less likely the cause of her symptoms.  Plan to [facility] to evaluate for CVA Workup for AMS including infectious, UA, CXR Trop, ECG Accucheck was wnl  Patient otherwise stable  This note was dictated using dragon dictation software.  Please be aware that there may be minor translation errors as a result of this oral dictation   Clinical Course as of Feb 08 142  Thu Feb 08, 2019  2034  IMPRESSION: No acute intracranial abnormality.  Findings consistent with age related atrophy and chronic small vessel ischemia Bilateral lacunar infarcts.   [MT]  2057  I spoke to Dr Amie Portland of neurology who recommends MRI brain without contrast to better characterize lacunar infarcts; if these are acute, he will be contacted again.  Otherwise we will continue our medical w/u for AMS including UA   [MT]  2226 WBC, UA(!): >50 [MT]  2226 Leukocytes,Ua(!): LARGE [MT]  2226 Bacteria, UA(!): FEW [MT]  2229 Will admit to hospitalist - AMS with likely UTI, pending MR imaging of brain   [MT]  2230 On reassessment patient is more "awake" and cracking jokes with me.  Her daughte rin law at bedside says this is closer to her typical behavior   [MT]  2301 Gave signout to hospitalist   [MT]  2323 I spoke to Dr Weldon Picking who reviewed CT imaging and feels lacunar infarcts are chronic, he is no longer recommending MRI.  I cancelled the order and also let the hosptialist know this   [MT]    Clinical Course User Index [MT] Aariel Ems, Carola Rhine, MD     Final Clinical Impressions(s) / ED Diagnoses   Final diagnoses:  Altered mental status, unspecified  altered mental status type  Lower urinary tract infectious disease    ED Discharge Orders    None       Glenice Ciccone, Carola Rhine, MD 02/09/19 [phone]

## 2019-02-08 NOTE — ED Notes (Signed)
Patient transported to CT 

## 2019-02-08 NOTE — ED Triage Notes (Signed)
Patient arrives via EMS from home with due to her medical alert necklace triggering an alert. Per ems, patient was found down with her LKW being 0800. Patient lives alone. Daughter spent the night with her last night and this morning, which was her last know well. Per ems, medical alert went off. Patient was found down at home with questionable LOC.   Some slurred speech on the scene and on arrival.   Hx of being legally blind and chronic back pain.

## 2019-02-09 ENCOUNTER — Encounter (HOSPITAL_COMMUNITY): Payer: Self-pay | Admitting: Internal Medicine

## 2019-02-09 ENCOUNTER — Observation Stay (HOSPITAL_COMMUNITY): Payer: Medicare HMO

## 2019-02-09 DIAGNOSIS — I10 Essential (primary) hypertension: Secondary | ICD-10-CM | POA: Diagnosis not present

## 2019-02-09 DIAGNOSIS — G9340 Encephalopathy, unspecified: Secondary | ICD-10-CM

## 2019-02-09 DIAGNOSIS — N390 Urinary tract infection, site not specified: Secondary | ICD-10-CM | POA: Diagnosis not present

## 2019-02-09 LAB — COMPREHENSIVE METABOLIC PANEL
ALT: 19 U/L (ref 0–44)
AST: 23 U/L (ref 15–41)
Albumin: 3.9 g/dL (ref 3.5–5.0)
Alkaline Phosphatase: 68 U/L (ref 38–126)
Anion gap: 11 (ref 5–15)
BUN: 16 mg/dL (ref 8–23)
CO2: 23 mmol/L (ref 22–32)
Calcium: 9.1 mg/dL (ref 8.9–10.3)
Chloride: 107 mmol/L (ref 98–111)
Creatinine, Ser: 0.64 mg/dL (ref 0.44–1.00)
GFR calc Af Amer: >60 mL/min (ref >60)
GFR calc non Af Amer: >60 mL/min (ref >60)
Glucose, Bld: 95 mg/dL (ref 70–99)
Potassium: 3.7 mmol/L (ref 3.5–5.1)
Sodium: 141 mmol/L (ref 135–145)
Total Bilirubin: 0.6 mg/dL (ref 0.3–1.2)
Total Protein: 6.5 g/dL (ref 6.5–8.1)

## 2019-02-09 LAB — CBC WITH DIFFERENTIAL/PLATELET
Abs Immature Granulocytes: 0.02 10*3/uL (ref 0.00–0.07)
Basophils Absolute: 0 10*3/uL (ref 0.0–0.1)
Basophils Relative: 1 %
Eosinophils Absolute: 0.1 10*3/uL (ref 0.0–0.5)
Eosinophils Relative: 2 %
HCT: 40.7 % (ref 36.0–46.0)
Hemoglobin: 13.4 g/dL (ref 12.0–15.0)
Immature Granulocytes: 0 %
Lymphocytes Relative: 32 %
Lymphs Abs: 1.9 10*3/uL (ref 0.7–4.0)
MCH: 32.8 pg (ref 26.0–34.0)
MCHC: 32.9 g/dL (ref 30.0–36.0)
MCV: 99.5 fL (ref 80.0–100.0)
Monocytes Absolute: 0.6 10*3/uL (ref 0.1–1.0)
Monocytes Relative: 10 %
Neutro Abs: 3.3 10*3/uL (ref 1.7–7.7)
Neutrophils Relative %: 55 %
Platelets: 156 10*3/uL (ref 150–400)
RBC: 4.09 MIL/uL (ref 3.87–5.11)
RDW: 12.5 % (ref 11.5–15.5)
WBC: 6 10*3/uL (ref 4.0–10.5)
nRBC: 0 % (ref 0.0–0.2)

## 2019-02-09 LAB — SARS CORONAVIRUS 2 (TAT 6-24 HRS): SARS Coronavirus 2: NEGATIVE

## 2019-02-09 LAB — AMMONIA: Ammonia: 23 umol/L (ref 9–35)

## 2019-02-09 LAB — TSH: TSH: 2.746 u[IU]/mL (ref 0.350–4.500)

## 2019-02-09 LAB — CK: Total CK: 46 U/L (ref 38–234)

## 2019-02-09 MED ORDER — PANTOPRAZOLE SODIUM 40 MG PO TBEC
40.0000 mg | DELAYED_RELEASE_TABLET | Freq: Every day | ORAL | Status: DC
  Administered 2019-02-09: 40 mg via ORAL
  Filled 2019-02-09: qty 1

## 2019-02-09 MED ORDER — SODIUM CHLORIDE 0.9 % IV SOLN
INTRAVENOUS | Status: DC
  Administered 2019-02-09: 06:00:00 via INTRAVENOUS

## 2019-02-09 MED ORDER — PRAVASTATIN SODIUM 10 MG PO TABS: 20.0000 mg | ORAL_TABLET | Freq: Every day | ORAL | Status: DC

## 2019-02-09 MED ORDER — OMEGA-3-ACID ETHYL ESTERS 1 G PO CAPS
1.0000 g | ORAL_CAPSULE | Freq: Every day | ORAL | Status: DC
  Administered 2019-02-09: 1 g via ORAL
  Filled 2019-02-09: qty 1

## 2019-02-09 MED ORDER — ACETAMINOPHEN 650 MG RE SUPP: 650.0000 mg | Freq: Four times a day (QID) | RECTAL | Status: DC | PRN

## 2019-02-09 MED ORDER — LEVOTHYROXINE SODIUM 100 MCG PO TABS
100.0000 ug | ORAL_TABLET | Freq: Every day | ORAL | Status: DC
  Administered 2019-02-09: 100 ug via ORAL
  Filled 2019-02-09: qty 1

## 2019-02-09 MED ORDER — ENOXAPARIN SODIUM 40 MG/0.4ML ~~LOC~~ SOLN
40.0000 mg | SUBCUTANEOUS | Status: DC
  Administered 2019-02-09: 40 mg via SUBCUTANEOUS
  Filled 2019-02-09: qty 0.4

## 2019-02-09 MED ORDER — SERTRALINE HCL 100 MG PO TABS
100.0000 mg | ORAL_TABLET | Freq: Every day | ORAL | Status: DC
  Administered 2019-02-09: 100 mg via ORAL
  Filled 2019-02-09: qty 1

## 2019-02-09 MED ORDER — ONDANSETRON HCL 4 MG PO TABS: 4.0000 mg | ORAL_TABLET | Freq: Four times a day (QID) | ORAL | Status: DC | PRN

## 2019-02-09 MED ORDER — TRANDOLAPRIL 2 MG PO TABS
2.0000 mg | ORAL_TABLET | Freq: Every day | ORAL | Status: DC
  Filled 2019-02-09 (×2): qty 1

## 2019-02-09 MED ORDER — ACETAMINOPHEN 325 MG PO TABS: 650.0000 mg | ORAL_TABLET | Freq: Four times a day (QID) | ORAL | Status: DC | PRN

## 2019-02-09 MED ORDER — NITROGLYCERIN 0.4 MG SL SUBL: 0.4000 mg | SUBLINGUAL_TABLET | SUBLINGUAL | Status: DC | PRN

## 2019-02-09 MED ORDER — SODIUM CHLORIDE 0.9 % IV SOLN
1.0000 g | INTRAVENOUS | Status: DC
  Filled 2019-02-09: qty 10

## 2019-02-09 MED ORDER — ONDANSETRON HCL 4 MG/2ML IJ SOLN: 4.0000 mg | Freq: Four times a day (QID) | INTRAMUSCULAR | Status: DC | PRN

## 2019-02-09 MED ORDER — AMLODIPINE BESYLATE 2.5 MG PO TABS
2.5000 mg | ORAL_TABLET | Freq: Every day | ORAL | Status: DC
  Administered 2019-02-09: 2.5 mg via ORAL
  Filled 2019-02-09: qty 1

## 2019-02-09 NOTE — Evaluation (Signed)
Physical Therapy Evaluation Patient Details Name: [name] MRN: [id number] DOB: Nov 23, 1929 Today's Date: 02/09/2019   History of Present Illness  Pt is an 85 y/o female admitted secondary to increased confusion. MRI negative for acute abnormality. UA consistent with UTI. PMH includes HTN, CAD s/p stent, PAD, L breast cancer, and macular degeneration.   Clinical Impression  Pt admitted secondary to problem above with deficits below. Pt mildly unsteady, however, no overt LOB noted. Pt reporting increased weakness in BLE as well. Required min guard A for mobility tasks. Pt's daughter present throughout session and reports she can stay with pt if needed. Educated about having increased assist initially to ensure safety at home. Pt and pt's family refusing HHPT at this time. Will continue to follow acutely to maximize functional mobility independence and safety.     Follow Up Recommendations No PT follow up;Supervision for mobility/OOB(pt and family refusing HHPT )    Equipment Recommendations  None recommended by PT    Recommendations for Other Services       Precautions / Restrictions Precautions Precautions: Fall Restrictions Weight Bearing Restrictions: No      Mobility  Bed Mobility               General bed mobility comments: Sitting at EOB upon entry.   Transfers Overall transfer level: Needs assistance Equipment used: None Transfers: Sit to/from Stand Sit to Stand: Min guard         General transfer comment: Min guard for safety.   Ambulation/Gait Ambulation/Gait assistance: Min guard Gait Distance (Feet): 150 Feet Assistive device: None Gait Pattern/deviations: Step-through pattern;Decreased stride length;Drifts right/left Gait velocity: Decreased   General Gait Details: Slow, mildly unsteady gait, however, no overt LOB noted. Pt reporting SOB, however, reports this is baseline. Oxygen sats at 93% on RA following gait. Pt reports feeling weaker than  normal.   Stairs            Wheelchair Mobility    Modified Rankin (Stroke Patients Only)       Balance Overall balance assessment: Needs assistance Sitting-balance support: No upper extremity supported;Feet supported Sitting balance-Leahy Scale: Good     Standing balance support: No upper extremity supported;During functional activity Standing balance-Leahy Scale: Fair                               Pertinent Vitals/Pain Pain Assessment: No/denies pain    Home Living Family/patient expects to be discharged to:: Private residence Living Arrangements: Alone Available Help at Discharge: Family;Available 24 hours/day Type of Home: House Home Access: Stairs to enter Entrance Stairs-Rails: Right Entrance Stairs-Number of Steps: 3 Home Layout: Two level;Able to live on main level with bedroom/bathroom Home Equipment: None      Prior Function Level of Independence: Independent               Hand Dominance        Extremity/Trunk Assessment   Upper Extremity Assessment Upper Extremity Assessment: Overall WFL for tasks assessed    Lower Extremity Assessment Lower Extremity Assessment: Generalized weakness    Cervical / Trunk Assessment Cervical / Trunk Assessment: Normal  Communication   Communication: No difficulties  Cognition Arousal/Alertness: Awake/alert Behavior During Therapy: Impulsive Overall Cognitive Status: Impaired/Different from baseline Area of Impairment: Safety/judgement                         Safety/Judgement: Decreased awareness of safety;Decreased  awareness of deficits     General Comments: Pt slightly impulsive and requiring safety cues to wait for PT to perform mobility tasks.       General Comments General comments (skin integrity, edema, etc.): Pt's daughter present during session.     Exercises     Assessment/Plan    PT Assessment Patient needs continued PT services  PT Problem List  Decreased balance;Decreased strength;Decreased mobility;Decreased safety awareness;Decreased knowledge of precautions       PT Treatment Interventions Gait training;Stair training;Functional mobility training;Therapeutic activities;Therapeutic exercise;Balance training;Patient/family education    PT Goals (Current goals can be found in the Care Plan section)  Acute Rehab PT Goals Patient Stated Goal: to go home PT Goal Formulation: With patient Time For Goal Achievement: 02/23/19 Potential to Achieve Goals: Good    Frequency Min 3X/week   Barriers to discharge        Co-evaluation               AM-PAC PT "6 Clicks" Mobility  Outcome Measure Help needed turning from your back to your side while in a flat bed without using bedrails?: A Little Help needed moving from lying on your back to sitting on the side of a flat bed without using bedrails?: A Little Help needed moving to and from a bed to a chair (including a wheelchair)?: A Little Help needed standing up from a chair using your arms (e.g., wheelchair or bedside chair)?: A Little Help needed to walk in hospital room?: A Little Help needed climbing 3-5 steps with a railing? : A Little 6 Click Score: 18    End of Session Equipment Utilized During Treatment: Gait belt Activity Tolerance: Patient tolerated treatment well Patient left: in bed;with call bell/phone within reach;with family/visitor present Nurse Communication: Mobility status PT Visit Diagnosis: Muscle weakness (generalized) (M62.81);Unsteadiness on feet (R26.81)    Time: [id number] PT Time Calculation (min) (ACUTE ONLY): 13 min   Charges:   PT Evaluation $PT Eval Low Complexity: Edmunds, PT, DPT  Acute Rehabilitation Services  Pager: [phone] Office: [phone]   Rudean Hitt 02/09/2019, 1:40 PM

## 2019-02-09 NOTE — Discharge Summary (Signed)
Physician Discharge Summary  [name] [id number] DOB: 10-11-1929 DOA: 02/08/2019  PCP: Drake Leach, MD  Admit date: 02/08/2019 Discharge date: 02/09/2019  Admitted From: home Discharge disposition: home   Recommendations for Outpatient Follow-Up:   Cautious use of xanax Doubt UTI but please follow urine culture to final-- got dose of rocephin in hospital (a febrile and a symptomatic)- recent course of doxy as well (7 days)  Discharge Diagnosis:   Principal Problem:   Acute encephalopathy Active Problems:   Hypothyroidism   Essential hypertension - With permissive HTN   PAD - s/p diamondback orbital rotation atherectomy, PTA + stenting of calcified ostical right CIA- 02/13/13   CAD S/P percutaneous coronary angioplasty   Malignant neoplasm of upper-outer quadrant of left breast in female, estrogen receptor positive ([place])     Discharge Condition: Improved.  Diet recommendation: Low sodium, heart healthy  Wound care: None.  Code status: Full.   History of Present Illness:   [name] is a 85 y.o. female with history of CAD status post stenting, hypertension, hypothyroidism, peripheral arterial disease was brought to the ER after patient was found to be confused slurring speech and fall.  As per the patient's daughter-in-law spoke to the ER physician patient was doing fine in the morning when patient's daughter-in-law was talking over the phone.  Later in the afternoon patient's neighbor went on to check with she was found on the floor confused having slurred speech and gait imbalance.  At this point EMS was called and patient was brought to the ER.    Hospital Course by Problem:   Acute toxic encephalopathy - MRI shows chronic changes.  Per chart review EMS documented that patient was found next to xanax bottle with 16 missing (filled on 10/12). Patient denied intentionally taking more than prescribed.  She did say she took ibuprofen prior to  incidence-- she is legally blind-- ? If she mistook the bottles and ingested xanax by mistake -doubt UTI but please follow culture to final -patient and family declined home health-- suspect she needs more assistance at home    Medical Consultants:      Discharge Exam:   Vitals:   02/09/19 0038 02/09/19 1312  BP: (!) 159/96 (!) 142/52  Pulse: 66 73  Resp: 17 18  Temp: (!) 97.4 F (36.3 C) 97.8 F (36.6 C)  SpO2: 100% 94%   Vitals:   02/08/19 2322 02/08/19 2330 02/09/19 0038 02/09/19 1312  BP: (!) 171/74 (!) 165/148 (!) 159/96 (!) 142/52  Pulse: 64 64 66 73  Resp: 18 16 17 18   Temp:   (!) 97.4 F (36.3 C) 97.8 F (36.6 C)  TempSrc:   Oral Oral  SpO2: 97% 98% 100% 94%  Weight:      Height:        General exam: Appears calm and comfortable.   The results of significant diagnostics from this hospitalization (including imaging, microbiology, ancillary and laboratory) are listed below for reference.     Procedures and Diagnostic Studies:   Ct Head Wo Contrast  Result Date: 02/08/2019 CLINICAL DATA:  Altered level of consciousness EXAM: CT HEAD WITHOUT CONTRAST TECHNIQUE: Contiguous axial images were obtained from the base of the skull through the vertex without intravenous contrast. COMPARISON:  Sep 04, 2004, MR head November 16, 2008. FINDINGS: Brain: No evidence of acute territorial infarction, hemorrhage, hydrocephalus,extra-axial collection or mass lesion/mass effect. There is dilatation the ventricles and sulci consistent with age-related atrophy. Low-attenuation changes  in the deep white matter consistent with small vessel ischemia. Lacunar infarct is again noted within the right internal capsule and left basal ganglia. Vascular: No hyperdense vessel or unexpected calcification. Skull: The skull is intact. No fracture or focal lesion identified. Sinuses/Orbits: The visualized paranasal sinuses and mastoid air cells are clear. The orbits and globes intact. Other: None  IMPRESSION: No acute intracranial abnormality. Findings consistent with age related atrophy and chronic small vessel ischemia Bilateral lacunar infarcts. Electronically Signed   By: Prudencio Pair M.D.   On: 02/08/2019 20:23   Mr Brain Wo Contrast  Result Date: 02/09/2019 CLINICAL DATA:  Confusion with slurred speech EXAM: MRI HEAD WITHOUT CONTRAST TECHNIQUE: Multiplanar, multiecho pulse sequences of the brain and surrounding structures were obtained without intravenous contrast. COMPARISON:  Which head CT from yesterday FINDINGS: Brain: No acute infarction, hemorrhage, hydrocephalus, extra-axial collection or mass lesion. Small remote left cerebellar infarct. Mild for age chronic small vessel ischemic change in the periventricular white matter. Mild for age cerebral volume loss. Dilated perivascular spaces, larger on the right below the putamen. Vascular: Normal flow voids Skull and upper cervical spine: Nodes negative for marrow lesion Sinuses/Orbits: Bilateral cataract resection. Partial right mastoid opacification with negative nasopharynx. IMPRESSION: Aging brain without acute or reversible finding. Electronically Signed   By: Monte Fantasia M.D.   On: 02/09/2019 11:22     Labs:   Basic Metabolic Panel: Recent Labs  Lab 02/08/19 1950 02/08/19 2018 02/09/19 0445  NA 140 141 141  K 4.1 4.1 3.7  CL 108 108 107  CO2 21*  --  23  GLUCOSE 100* 96 95  BUN 19 25* 16  CREATININE 0.67 0.60 0.64  CALCIUM 9.1  --  9.1   GFR Estimated Creatinine Clearance: 42.8 mL/min (by C-G formula based on SCr of 0.64 mg/dL). Liver Function Tests: Recent Labs  Lab 02/08/19 1950 02/09/19 0445  AST 27 23  ALT 20 19  ALKPHOS 72 68  BILITOT 0.8 0.6  PROT 6.2* 6.5  ALBUMIN 3.9 3.9   No results for input(s): LIPASE, AMYLASE in the last 168 hours. Recent Labs  Lab 02/09/19 0445  AMMONIA 23   Coagulation profile Recent Labs  Lab 02/08/19 1950  INR 1.0    CBC: Recent Labs  Lab 02/08/19 1950  02/08/19 2018 02/09/19 0445  WBC 6.2  --  6.0  NEUTROABS 3.7  --  3.3  HGB 13.0 13.3 13.4  HCT 40.5 39.0 40.7  MCV 101.0*  --  99.5  PLT 164  --  156   Cardiac Enzymes: Recent Labs  Lab 02/09/19 0445  CKTOTAL 46   BNP: Invalid input(s): POCBNP CBG: Recent Labs  Lab 02/08/19 2006  GLUCAP 89   D-Dimer No results for input(s): DDIMER in the last 72 hours. Hgb A1c No results for input(s): HGBA1C in the last 72 hours. Lipid Profile No results for input(s): CHOL, HDL, LDLCALC, TRIG, CHOLHDL, LDLDIRECT in the last 72 hours. Thyroid function studies Recent Labs    02/09/19 0445  TSH 2.746   Anemia work up No results for input(s): VITAMINB12, FOLATE, FERRITIN, TIBC, IRON, RETICCTPCT in the last 72 hours. Microbiology Recent Results (from the past 240 hour(s))  SARS CORONAVIRUS 2 (TAT 6-24 HRS) Nasopharyngeal Nasopharyngeal Swab     Status: None   Collection Time: 02/08/19 10:59 PM   Specimen: Nasopharyngeal Swab  Result Value Ref Range Status   SARS Coronavirus 2 NEGATIVE NEGATIVE Final    Comment: (NOTE) SARS-CoV-2 target nucleic acids are  NOT DETECTED. The SARS-CoV-2 RNA is generally detectable in upper and lower respiratory specimens during the acute phase of infection. Negative results do not preclude SARS-CoV-2 infection, do not rule out co-infections with other pathogens, and should not be used as the sole basis for treatment or other patient management decisions. Negative results must be combined with clinical observations, patient history, and epidemiological information. The expected result is Negative. Fact Sheet for Patients: SugarRoll.be Fact Sheet for Healthcare Providers: https://www.woods-mathews.com/ This test is not yet approved or cleared by the [place] FDA and  has been authorized for detection and/or diagnosis of SARS-CoV-2 by FDA under an Emergency Use Authorization (EUA). This EUA will remain  in  effect (meaning this test can be used) for the duration of the COVID-19 declaration under Section 56 4(b)(1) of the Act, 21 U.S.C. section 360bbb-3(b)(1), unless the authorization is terminated or revoked sooner. Performed at [facility], [place] [address], [place], [place] [address]      Discharge Instructions:   Discharge Instructions    Diet - low sodium heart healthy   Complete by: As directed    Discharge instructions   Complete by: As directed    Would avoid ibuprofen-- use tylenol if needed Cautious use of xanax- monitor pill count Family supervision   Increase activity slowly   Complete by: As directed      Allergies as of 02/09/2019      Reactions   Avastin [bevacizumab] Other (See Comments)   Muscle pain   Benadryl [diphenhydramine] Palpitations, Other (See Comments)   TACHYCARDIA   Hydrocodone-acetaminophen Nausea And Vomiting, Other (See Comments)   Delirium   Levofloxacin Other (See Comments)   Myalgias   Statins Other (See Comments)   Myalgias   Beta Adrenergic Blockers Other (See Comments)   Fatigue   Trazodone Rash, Other (See Comments)   "TERRIBLE SIDE EFFECTS"- Hallucinations and stopped her from urinating (also)      Medication List    STOP taking these medications   ibuprofen 200 MG tablet Commonly known as: ADVIL     TAKE these medications   ALPRAZolam 1 MG tablet Commonly known as: XANAX Take 1 mg by mouth at bedtime.   amLODipine 2.5 MG tablet Commonly known as: NORVASC Take 1 tablet (2.5 mg total) by mouth daily.   Fish Oil 1000 MG Caps Take 1,000 mg by mouth daily.   levothyroxine 100 MCG tablet Commonly known as: SYNTHROID Take 100 mcg by mouth daily before breakfast.   lovastatin 20 MG tablet Commonly known as: MEVACOR Take 20 mg by mouth at bedtime.   Lubricant Eye Drops 0.4-0.3 % Soln Generic drug: Polyethyl Glycol-Propyl Glycol Place 1 drop into both eyes as needed (for dryness).   nitroGLYCERIN 0.4 MG SL  tablet Commonly known as: NITROSTAT Place 1 tablet (0.4 mg total) under the tongue every 5 (five) minutes as needed for chest pain.   omeprazole 40 MG capsule Commonly known as: PRILOSEC Take 40 mg by mouth daily before breakfast.   Pepcid AC Maximum Strength 20 MG tablet Generic drug: famotidine Take 20 mg by mouth daily as needed (when Omeprazole isn't controlling heartburn or ingestion symptoms).   sertraline 100 MG tablet Commonly known as: ZOLOFT Take 100 mg by mouth daily.   trandolapril 2 MG tablet Commonly known as: MAVIK Take 1 tablet (2 mg total) by mouth daily.   Vitamin D-3 125 MCG (5000 UT) Tabs Take 5,000 Units by mouth daily.      Follow-up Information  Drake Leach, MD Follow up in 1 week(s).   Specialty: Internal Medicine Contact information: [address] [place] [address] [phone]        Leonie Man, MD .   Specialty: Cardiology Contact information: [address] [place] [address] [phone]            Time coordinating discharge: 25 min  Signed:  Geradine Girt DO  [facility] 02/09/2019, 2:15 PM

## 2019-02-09 NOTE — H&P (Signed)
History and Physical    [name] [id number] DOB: Jan 17, 1930 DOA: 02/08/2019  PCP: Drake Leach, MD  Patient coming from: Home.  Chief Complaint: Fall and confusion.  HPI: [name] is a 85 y.o. female with history of CAD status post stenting, hypertension, hypothyroidism, peripheral arterial disease was brought to the ER after patient was found to be confused slurring speech and fall.  As per the patient's daughter-in-law spoke to the ER physician patient was doing fine in the morning when patient's daughter-in-law was talking over the phone.  Later in the afternoon patient's neighbor went on to check with she was found on the floor confused having slurred speech and gait imbalance.  At this point EMS was called and patient was brought to the ER.  ED Course: In the ER patient appeared mildly confused which gradually improved.  CT head did not show anything acute.  Did show some lateral infarct which neurologist feels is old.  EKG shows normal sinus rhythm.  UA is consistent with UTI.  On exam patient does have some slurred speech but otherwise moves all extremities.  At time of my exam patient has become more alert awake oriented.  Labs show sodium 140 potassium 4.1 creatinine 1.6 high-sensitivity troponin was 6 and 6 lactic acid 1.2 WBC 6.2 hemoglobin 13 platelets 164 EKG normal sinus rhythm.  UA is consistent with UTI.  COVID-19 test was negative.  Review of Systems: As per HPI, rest all negative.   Past Medical History:  Diagnosis Date  . Abdominal aortic aneurysm ([place])    asymptomatic, 2012 -  2.6 x 2.4cm  . Anxiety   . CAD S/P percutaneous coronary angioplasty    Cath 2010: 100% occlusion of RCA  (2 BMS stents finally occluded) w/ left to right collaterals; moderate LAD and circumflex disease.  EF 55-60%;; cath 2015 with medical therapy recommended   . Chronic lower back pain   . Daily headache       . Depression   . Dyspnea    on exertion sometimes  . Exertional  shortness of breath   . GERD (gastroesophageal reflux disease)   . History of kidney stones   . History of stomach ulcers   . Hyperlipidemia   . Hypertension   . Hypothyroidism   . Macular degeneration    "started out dry; now is wet" (01/08/2013)  . Migraines       . Myocardial infarction ([place]) 2003  . PAD (peripheral artery disease) ([place]) 12/2005   s/p L Common Iliac Stent  . PONV (postoperative nausea and vomiting)   . Walking pneumonia 1970's    Past Surgical History:  Procedure Laterality Date  . ABD AOTRA DOPPLER  08/14/2012   distal mild dilatation 2.6 x 2.5cm unchanged;right cia 70-99%  new finding  . Abdominal Aorta Duplex  09/2014   No change from prior study; 2.6 x 2.4 cm mild AAA. R Com Iliac A  > 50%. -- f/u 24 months.  . ABDOMINAL AORTAGRAM  01/08/2013   Procedure: ABDOMINAL Maxcine Ham;  Surgeon: Lorretta Harp, MD;  Location: [facility] CATH LAB;  Service: Cardiovascular;;  . ANGIOPLASTY Right 01/08/2013   unsucess stenting RLE   . APPENDECTOMY    . BREAST LUMPECTOMY WITH RADIOACTIVE SEED LOCALIZATION Left 08/23/2017   Procedure: BREAST LUMPECTOMY WITH RADIOACTIVE SEED LOCALIZATION;  Surgeon: Erroll Luna, MD;  Location: [facility];  Service: General;  Laterality: Left;  . CARDIAC CATHETERIZATION  11/2008   diffuse ISR in  RCA  w/ good L-R collaterals; AV groove Cx - 60-70% stenosis with post stenosis ectasia;  EF 55%, mild  CAD in LAD -- Med Rx  . CATARACT EXTRACTION W/ INTRAOCULAR LENS  IMPLANT, BILATERAL Bilateral   . CHOLECYSTECTOMY    . CORONARY ANGIOPLASTY  April 2006   Cutting Balloon PTCA of RCA ISR  . CORONARY ANGIOPLASTY WITH STENT PLACEMENT  Jan 2004   RCA - 2 overlapping Pixel BMS 2.0 mm x 18 mm & 2.0 mm x 23 m   . DILATION AND CURETTAGE OF UTERUS     "couple over the years" (01/08/2013)  . ILIAC ARTERY STENT Left 12/30/2005   PTA anddirect stenting  of left common iliac artery - 8.0 x 18 mm OmniLink stent  . ILIAC ARTERY STENT Right 02/13/2013   successful  diamondback orbital rotation lipectomy, PTA and stenting of highly calcified ostial common iliac artery/notes 02/13/2013  . LEFT HEART CATHETERIZATION WITH CORONARY ANGIOGRAM N/A 11/13/2013   Procedure: LEFT HEART CATHETERIZATION WITH CORONARY ANGIOGRAM;  Surgeon: Leonie Man, MD; LAD 40%, CFX 60%, OM1 60%, RCA diffuse in-stent restenosis up to 99%, small PDA and PLA, collaterals from left system noted, EF 55-60 percent  . LOWER EXTREMITY ANGIOGRAM N/A 01/08/2013   Procedure: LOWER EXTREMITY ANGIOGRAM;  Surgeon: Lorretta Harp, MD;  Location: [facility] CATH LAB;  Service: Cardiovascular;  Laterality: N/A;  . NM MYOCAR PERF WALL MOTION  12/27/2011 -[place]   LV normal,EF 81 %  . NM MYOCAR PERF WALL MOTION  01/2015   EF 56%. Normal ROM of low risk. Small defect of mild severity and apical anterior location likely related to breast attenuation. Cannot rule out small area of mild ischemia.  . TRANSTHORACIC ECHOCARDIOGRAM  10/2013   Normal LV size low normal function. EF 50-55%. No regional wall motion abnormality. GR 1 DD. PAP ~ 47 mmHg     reports that she quit smoking about 31 years ago. Her smoking use included cigarettes. She has a 40.00 pack-year smoking history. She has never used smokeless tobacco. She reports that she does not drink alcohol or use drugs.  Allergies  Allergen Reactions  . Avastin [Bevacizumab] Other (See Comments)    Muscle pain  . Benadryl [Diphenhydramine] Palpitations and Other (See Comments)    TACHYCARDIA  . Hydrocodone-Acetaminophen Nausea And Vomiting and Other (See Comments)    Delirium  . Levofloxacin Other (See Comments)    Myalgias  . Statins Other (See Comments)    Myalgias  . Beta Adrenergic Blockers Other (See Comments)    Fatigue   . Trazodone Rash and Other (See Comments)    "TERRIBLE SIDE EFFECTS"- Hallucinations and stopped her from urinating (also)    Family History  Problem Relation Age of Onset  . Diabetes Father   . Breast cancer Neg Hx    . Ovarian cancer Neg Hx     Prior to Admission medications   Medication Sig Start Date End Date Taking? Authorizing Provider  ALPRAZolam Duanne Moron) 1 MG tablet Take 1 mg by mouth at bedtime.   Yes [provider]  amLODipine (NORVASC) 2.5 MG tablet Take 1 tablet (2.5 mg total) by mouth daily. 10/25/18  Yes Leonie Man, MD  Cholecalciferol (VITAMIN D-3) 5000 units TABS Take 5,000 Units by mouth daily.   Yes [provider]  famotidine (PEPCID AC MAXIMUM STRENGTH) 20 MG tablet Take 20 mg by mouth daily as needed (when Omeprazole isn't controlling heartburn or ingestion symptoms).    Yes [provider]  ibuprofen (ADVIL) 200 MG tablet Take 400 mg by mouth every 6 (six) hours as needed (for back pain).   Yes [provider]  levothyroxine (SYNTHROID, LEVOTHROID) 100 MCG tablet Take 100 mcg by mouth daily before breakfast.  10/29/14  Yes [provider]  lovastatin (MEVACOR) 20 MG tablet Take 20 mg by mouth at bedtime.  01/29/19  Yes [provider]  nitroGLYCERIN (NITROSTAT) 0.4 MG SL tablet Place 1 tablet (0.4 mg total) under the tongue every 5 (five) minutes as needed for chest pain. 02/11/16  Yes Leonie Man, MD  Omega-3 Fatty Acids (FISH OIL) 1000 MG CAPS Take 1,000 mg by mouth daily.    Yes [provider]  omeprazole (PRILOSEC) 40 MG capsule Take 40 mg by mouth daily before breakfast. 11/16/18  Yes [provider]  Polyethyl Glycol-Propyl Glycol (LUBRICANT EYE DROPS) 0.4-0.3 % SOLN Place 1 drop into both eyes as needed (for dryness).    Yes [provider]  sertraline (ZOLOFT) 100 MG tablet Take 100 mg by mouth daily.  05/19/17  Yes [provider]  trandolapril (MAVIK) 2 MG tablet Take 1 tablet (2 mg total) by mouth daily. 11/28/18  Yes Leonie Man, MD    Physical Exam: Constitutional: Moderately built and nourished. Vitals:   02/08/19 2200 02/08/19 2322 02/08/19 2330 02/09/19 0038  BP: (!)  154/76 (!) 171/74 (!) 165/148 (!) 159/96  Pulse: 65 64 64 66  Resp: (!) 24 18 16 17   Temp:    (!) 97.4 F (36.3 C)  TempSrc:    Oral  SpO2: 94% 97% 98% 100%  Weight:      Height:       Eyes: Anicteric no pallor. ENMT: No discharge from the ears eyes nose or mouth. Neck: No mass felt.  No neck rigidity. Respiratory: No rhonchi or crepitations. Cardiovascular: S1-S2 heard. Abdomen: Soft nontender bowel sounds present. Musculoskeletal: No edema. Skin: No rash. Neurologic: Alert awake oriented to time place and person moves all extremities. Psychiatric: Appears normal.   Labs on Admission: I have personally reviewed following labs and imaging studies  CBC: Recent Labs  Lab 02/08/19 1950 02/08/19 2018  WBC 6.2  --   NEUTROABS 3.7  --   HGB 13.0 13.3  HCT 40.5 39.0  MCV 101.0*  --   PLT 164  --    Basic Metabolic Panel: Recent Labs  Lab 02/08/19 1950 02/08/19 2018  NA 140 141  K 4.1 4.1  CL 108 108  CO2 21*  --   GLUCOSE 100* 96  BUN 19 25*  CREATININE 0.67 0.60  CALCIUM 9.1  --    GFR: Estimated Creatinine Clearance: 42.8 mL/min (by C-G formula based on SCr of 0.6 mg/dL). Liver Function Tests: Recent Labs  Lab 02/08/19 1950  AST 27  ALT 20  ALKPHOS 72  BILITOT 0.8  PROT 6.2*  ALBUMIN 3.9   No results for input(s): LIPASE, AMYLASE in the last 168 hours. No results for input(s): AMMONIA in the last 168 hours. Coagulation Profile: Recent Labs  Lab 02/08/19 1950  INR 1.0   Cardiac Enzymes: No results for input(s): CKTOTAL, CKMB, CKMBINDEX, TROPONINI in the last 168 hours. BNP (last 3 results) No results for input(s): PROBNP in the last 8760 hours. HbA1C: No results for input(s): HGBA1C in the last 72 hours. CBG: Recent Labs  Lab 02/08/19 2006  GLUCAP 89   Lipid Profile: No results for input(s): CHOL, HDL, LDLCALC, TRIG, CHOLHDL, LDLDIRECT in the last 72  hours. Thyroid Function Tests: No results for input(s): TSH, T4TOTAL, FREET4, T3FREE,  THYROIDAB in the last 72 hours. Anemia Panel: No results for input(s): VITAMINB12, FOLATE, FERRITIN, TIBC, IRON, RETICCTPCT in the last 72 hours. Urine analysis:    Component Value Date/Time   COLORURINE YELLOW 02/08/2019 2132   APPEARANCEUR HAZY (A) 02/08/2019 2132   LABSPEC 1.019 02/08/2019 2132   PHURINE 5.0 02/08/2019 2132   GLUCOSEU NEGATIVE 02/08/2019 2132   HGBUR NEGATIVE 02/08/2019 2132   BILIRUBINUR NEGATIVE 02/08/2019 2132   KETONESUR NEGATIVE 02/08/2019 2132   PROTEINUR NEGATIVE 02/08/2019 2132   NITRITE NEGATIVE 02/08/2019 2132   LEUKOCYTESUR LARGE (A) 02/08/2019 2132   Sepsis Labs: @LABRCNTIP (procalcitonin:4,lacticidven:4) ) Recent Results (from the past 240 hour(s))  SARS CORONAVIRUS 2 (TAT 6-24 HRS) Nasopharyngeal Nasopharyngeal Swab     Status: None   Collection Time: 02/08/19 10:59 PM   Specimen: Nasopharyngeal Swab  Result Value Ref Range Status   SARS Coronavirus 2 NEGATIVE NEGATIVE Final    Comment: (NOTE) SARS-CoV-2 target nucleic acids are NOT DETECTED. The SARS-CoV-2 RNA is generally detectable in upper and lower respiratory specimens during the acute phase of infection. Negative results do not preclude SARS-CoV-2 infection, do not rule out co-infections with other pathogens, and should not be used as the sole basis for treatment or other patient management decisions. Negative results must be combined with clinical observations, patient history, and epidemiological information. The expected result is Negative. Fact Sheet for Patients: SugarRoll.be Fact Sheet for Healthcare Providers: https://www.woods-mathews.com/ This test is not yet approved or cleared by the [place] FDA and  has been authorized for detection and/or diagnosis of SARS-CoV-2 by FDA under an Emergency Use Authorization (EUA). This EUA will remain  in effect (meaning this test can be used) for the duration of the COVID-19 declaration under  Section 56 4(b)(1) of the Act, 21 U.S.C. section 360bbb-3(b)(1), unless the authorization is terminated or revoked sooner. Performed at [facility], [place] [address], [place], [place] [address]      Radiological Exams on Admission: Ct Head Wo Contrast  Result Date: 02/08/2019 CLINICAL DATA:  Altered level of consciousness EXAM: CT HEAD WITHOUT CONTRAST TECHNIQUE: Contiguous axial images were obtained from the base of the skull through the vertex without intravenous contrast. COMPARISON:  Sep 04, 2004, MR head November 16, 2008. FINDINGS: Brain: No evidence of acute territorial infarction, hemorrhage, hydrocephalus,extra-axial collection or mass lesion/mass effect. There is dilatation the ventricles and sulci consistent with age-related atrophy. Low-attenuation changes in the deep white matter consistent with small vessel ischemia. Lacunar infarct is again noted within the right internal capsule and left basal ganglia. Vascular: No hyperdense vessel or unexpected calcification. Skull: The skull is intact. No fracture or focal lesion identified. Sinuses/Orbits: The visualized paranasal sinuses and mastoid air cells are clear. The orbits and globes intact. Other: None IMPRESSION: No acute intracranial abnormality. Findings consistent with age related atrophy and chronic small vessel ischemia Bilateral lacunar infarcts. Electronically Signed   By: Prudencio Pair M.D.   On: 02/08/2019 20:23    EKG: Independently reviewed.  Normal sinus rhythm.  Assessment/Plan Principal Problem:   Acute encephalopathy Active Problems:   Hypothyroidism   Essential hypertension - With permissive HTN   PAD - s/p diamondback orbital rotation atherectomy, PTA + stenting of calcified ostical right CIA- 02/13/13   CAD S/P percutaneous coronary angioplasty   Malignant neoplasm of upper-outer quadrant of left breast in female, estrogen receptor positive ([place])   Acute lower UTI  1. Acute encephalopathy likely from  UTI.  However since patient still has slurred speech I have ordered MRI brain.  On ceftriaxone follow urine culture.  Gentle hydration. 2. Hypertension on ACE inhibitor and amlodipine. 3. CAD status post stenting denies any chest pain.  On statins. 4. Hypothyroidism on Synthroid check TSH. 5. UTI on ceftriaxone. 6. History of breast cancer in remission.   DVT prophylaxis: Lovenox. Code Status: Full code. Family Communication: We will need to discuss with family. Disposition Plan: Home. Consults called: Physical therapy. Admission status: Observation.   Rise Patience MD [facility] Pager [phone].  If 7PM-7AM, please contact night-coverage [email or web address] Password [facility]  02/09/2019, 4:23 AM

## 2019-02-09 NOTE — Progress Notes (Signed)
Patient placed in observation after midnight.  MRI shows chronic changes.  Per chart review EMS documented that patient was found next to xanax bottle with 16 missing (filled on 10/12). Patient denied intentionally taking more than prescribed.  She did say she took ibuprofen prior to incidence-- she is legally blind-- ? If she mistook the bottles and ingested xanax by mistake PT eval Family states back to her baseline Home this PM after PT eval  Eulogio Bear DO

## 2019-02-09 NOTE — ED Notes (Signed)
ED TO INPATIENT HANDOFF REPORT  ED Nurse Name and Phone #:  [phone]  S Name/Age/Gender [name] 85 y.o. female Room/Bed: 033C/033C  Code Status   Code Status: Prior  Home/SNF/Other Home Patient oriented to: self, place and time Is this baseline? Yes   Triage Complete: Triage complete  Chief Complaint AMS LOC  Triage Note Patient arrives via EMS from home with due to her medical alert necklace triggering an alert. Per ems, patient was found down with her LKW being 0800. Patient lives alone. Daughter spent the night with her last night and this morning, which was her last know well. Per ems, medical alert went off. Patient was found down at home with questionable LOC.   Some slurred speech on the scene and on arrival.   Hx of being legally blind and chronic back pain.   Allergies Allergies  Allergen Reactions  . Avastin [Bevacizumab] Other (See Comments)    Muscle pain  . Benadryl [Diphenhydramine] Palpitations and Other (See Comments)    TACHYCARDIA  . Hydrocodone-Acetaminophen Nausea And Vomiting and Other (See Comments)    Delirium  . Levofloxacin Other (See Comments)    Myalgias  . Statins Other (See Comments)    Myalgias  . Beta Adrenergic Blockers Other (See Comments)    Fatigue   . Trazodone Rash and Other (See Comments)    "TERRIBLE SIDE EFFECTS"- Hallucinations and stopped her from urinating (also)    Level of Care/Admitting Diagnosis ED Disposition    ED Disposition Condition [place]: [place] [100100]  Level of Care: Telemetry Medical [104]  I expect the patient will be discharged within 24 hours: No (not a candidate for 5C-Observation unit)  Covid Evaluation: Asymptomatic Screening Protocol (No Symptoms)  Diagnosis: Acute encephalopathy [id number]  Admitting Physician: Rise Patience [phone]  Attending Physician: Rise Patience Lei.Right  PT Class (Do Not Modify): Observation [104]  PT Acc  Code (Do Not Modify): Observation [10022]       B Medical/Surgery History Past Medical History:  Diagnosis Date  . Abdominal aortic aneurysm ([place])    asymptomatic, 2012 -  2.6 x 2.4cm  . Anxiety   . CAD S/P percutaneous coronary angioplasty    Cath 2010: 100% occlusion of RCA  (2 BMS stents finally occluded) w/ left to right collaterals; moderate LAD and circumflex disease.  EF 55-60%;; cath 2015 with medical therapy recommended   . Chronic lower back pain   . Daily headache       . Depression   . Dyspnea    on exertion sometimes  . Exertional shortness of breath   . GERD (gastroesophageal reflux disease)   . History of kidney stones   . History of stomach ulcers   . Hyperlipidemia   . Hypertension   . Hypothyroidism   . Macular degeneration    "started out dry; now is wet" (01/08/2013)  . Migraines       . Myocardial infarction ([place]) 2003  . PAD (peripheral artery disease) ([place]) 12/2005   s/p L Common Iliac Stent  . PONV (postoperative nausea and vomiting)   . Walking pneumonia 1970's   Past Surgical History:  Procedure Laterality Date  . ABD AOTRA DOPPLER  08/14/2012   distal mild dilatation 2.6 x 2.5cm unchanged;right cia 70-99%  new finding  . Abdominal Aorta Duplex  09/2014   No change from prior study; 2.6 x 2.4 cm mild AAA. R Com Iliac A  >  50%. -- f/u 24 months.  . ABDOMINAL AORTAGRAM  01/08/2013   Procedure: ABDOMINAL Maxcine Ham;  Surgeon: Lorretta Harp, MD;  Location: [facility] CATH LAB;  Service: Cardiovascular;;  . ANGIOPLASTY Right 01/08/2013   unsucess stenting RLE   . APPENDECTOMY    . BREAST LUMPECTOMY WITH RADIOACTIVE SEED LOCALIZATION Left 08/23/2017   Procedure: BREAST LUMPECTOMY WITH RADIOACTIVE SEED LOCALIZATION;  Surgeon: Erroll Luna, MD;  Location: [facility];  Service: General;  Laterality: Left;  . CARDIAC CATHETERIZATION  11/2008   diffuse ISR in  RCA w/ good L-R collaterals; AV groove Cx - 60-70% stenosis with post stenosis ectasia;  EF 55%, mild  CAD  in LAD -- Med Rx  . CATARACT EXTRACTION W/ INTRAOCULAR LENS  IMPLANT, BILATERAL Bilateral   . CHOLECYSTECTOMY    . CORONARY ANGIOPLASTY  April 2006   Cutting Balloon PTCA of RCA ISR  . CORONARY ANGIOPLASTY WITH STENT PLACEMENT  Jan 2004   RCA - 2 overlapping Pixel BMS 2.0 mm x 18 mm & 2.0 mm x 23 m   . DILATION AND CURETTAGE OF UTERUS     "couple over the years" (01/08/2013)  . ILIAC ARTERY STENT Left 12/30/2005   PTA anddirect stenting  of left common iliac artery - 8.0 x 18 mm OmniLink stent  . ILIAC ARTERY STENT Right 02/13/2013   successful diamondback orbital rotation lipectomy, PTA and stenting of highly calcified ostial common iliac artery/notes 02/13/2013  . LEFT HEART CATHETERIZATION WITH CORONARY ANGIOGRAM N/A 11/13/2013   Procedure: LEFT HEART CATHETERIZATION WITH CORONARY ANGIOGRAM;  Surgeon: Leonie Man, MD; LAD 40%, CFX 60%, OM1 60%, RCA diffuse in-stent restenosis up to 99%, small PDA and PLA, collaterals from left system noted, EF 55-60 percent  . LOWER EXTREMITY ANGIOGRAM N/A 01/08/2013   Procedure: LOWER EXTREMITY ANGIOGRAM;  Surgeon: Lorretta Harp, MD;  Location: [facility] CATH LAB;  Service: Cardiovascular;  Laterality: N/A;  . NM MYOCAR PERF WALL MOTION  12/27/2011 -[place]   LV normal,EF 81 %  . NM MYOCAR PERF WALL MOTION  01/2015   EF 56%. Normal ROM of low risk. Small defect of mild severity and apical anterior location likely related to breast attenuation. Cannot rule out small area of mild ischemia.  . TRANSTHORACIC ECHOCARDIOGRAM  10/2013   Normal LV size low normal function. EF 50-55%. No regional wall motion abnormality. GR 1 DD. PAP ~ 47 mmHg     A IV Location/Drains/Wounds Patient Lines/Drains/Airways Status   Active Line/Drains/Airways    Name:   Placement date:   Placement time:   Site:   Days:   Peripheral IV 02/08/19 Right Arm   02/08/19    1956    Arm   1   Incision (Closed) 08/23/17 Breast Left   08/23/17    1131     535          Intake/Output  Last 24 hours No intake or output data in the 24 hours ending 02/09/19 0002  Labs/Imaging Results for orders placed or performed during the hospital encounter of 02/08/19 (from the past 48 hour(s))  Comprehensive metabolic panel     Status: Abnormal   Collection Time: 02/08/19  7:50 PM  Result Value Ref Range   Sodium 140 135 - 145 mmol/L   Potassium 4.1 3.5 - 5.1 mmol/L   Chloride 108 98 - 111 mmol/L   CO2 21 (L) 22 - 32 mmol/L   Glucose, Bld 100 (H) 70 - 99 mg/dL   BUN 19 8 -  23 mg/dL   Creatinine, Ser 0.67 0.44 - 1.00 mg/dL   Calcium 9.1 8.9 - 10.3 mg/dL   Total Protein 6.2 (L) 6.5 - 8.1 g/dL   Albumin 3.9 3.5 - 5.0 g/dL   AST 27 15 - 41 U/L   ALT 20 0 - 44 U/L   Alkaline Phosphatase 72 38 - 126 U/L   Total Bilirubin 0.8 0.3 - 1.2 mg/dL   GFR calc non Af Amer >60 >60 mL/min   GFR calc Af Amer >60 >60 mL/min   Anion gap 11 5 - 15    Comment: Performed at [facility], [place] [address] [place], [place] [address]  CBC WITH DIFFERENTIAL     Status: Abnormal   Collection Time: 02/08/19  7:50 PM  Result Value Ref Range   WBC 6.2 4.0 - 10.5 K/uL   RBC 4.01 3.87 - 5.11 MIL/uL   Hemoglobin 13.0 12.0 - 15.0 g/dL   HCT 40.5 36.0 - 46.0 %   MCV 101.0 (H) 80.0 - 100.0 fL   MCH 32.4 26.0 - 34.0 pg   MCHC 32.1 30.0 - 36.0 g/dL   RDW 12.4 11.5 - 15.5 %   Platelets 164 150 - 400 K/uL   nRBC 0.0 0.0 - 0.2 %   Neutrophils Relative % 61 %   Neutro Abs 3.7 1.7 - 7.7 K/uL   Lymphocytes Relative 27 %   Lymphs Abs 1.7 0.7 - 4.0 K/uL   Monocytes Relative 10 %   Monocytes Absolute 0.6 0.1 - 1.0 K/uL   Eosinophils Relative 2 %   Eosinophils Absolute 0.2 0.0 - 0.5 K/uL   Basophils Relative 0 %   Basophils Absolute 0.0 0.0 - 0.1 K/uL   Immature Granulocytes 0 %   Abs Immature Granulocytes 0.02 0.00 - 0.07 K/uL    Comment: Performed at [facility], [place] [address] [place], [place] [address]  Troponin I (High Sensitivity)     Status: None   Collection Time: 02/08/19  7:50 PM   Result Value Ref Range   Troponin I (High Sensitivity) 6 <18 ng/L    Comment: (NOTE) Elevated high sensitivity troponin I (hsTnI) values and significant  changes across serial measurements may suggest ACS but many other  chronic and acute conditions are known to elevate hsTnI results.  Refer to the "Links" section for chest pain algorithms and additional  guidance. Performed at [facility], [place] [address], [place], [place] [address]   Protime-INR     Status: None   Collection Time: 02/08/19  7:50 PM  Result Value Ref Range   Prothrombin Time 13.3 11.4 - 15.2 seconds   INR 1.0 0.8 - 1.2    Comment: (NOTE) INR goal varies based on device and disease states. Performed at [facility], [place] [address], [place], [place] [address]   Lactic acid, plasma     Status: None   Collection Time: 02/08/19  7:51 PM  Result Value Ref Range   Lactic Acid, Venous 1.2 0.5 - 1.9 mmol/L    Comment: Performed at [facility] [address], [place],  [address]  Ethanol     Status: None   Collection Time: 02/08/19  7:51 PM  Result Value Ref Range   Alcohol, Ethyl (B) <10 <10 mg/dL    Comment: (NOTE) Lowest detectable limit for serum alcohol is 10 mg/dL. For medical purposes only. Performed at [facility], [place] [address], [place], [place]  [id number]   CBG monitoring, ED     Status: None   Collection Time: 02/08/19  8:06 PM  Result Value Ref Range   Glucose-Capillary 89 70 - 99 mg/dL  I-Stat Chem 8, ED     Status: Abnormal   Collection Time: 02/08/19  8:18 PM  Result Value Ref Range   Sodium 141 135 - 145 mmol/L   Potassium 4.1 3.5 - 5.1 mmol/L   Chloride 108 98 - 111 mmol/L   BUN 25 (H) 8 - 23 mg/dL   Creatinine, Ser 0.60 0.44 - 1.00 mg/dL   Glucose, Bld 96 70 - 99 mg/dL   Calcium, Ion 1.16 1.15 - 1.40 mmol/L   TCO2 23 22 - 32 mmol/L   Hemoglobin 13.3 12.0 - 15.0 g/dL   HCT 39.0 36.0 - 46.0 %  Urinalysis, Complete w Microscopic     Status: Abnormal    Collection Time: 02/08/19  9:32 PM  Result Value Ref Range   Color, Urine YELLOW YELLOW   APPearance HAZY (A) CLEAR   Specific Gravity, Urine 1.019 1.005 - 1.030   pH 5.0 5.0 - 8.0   Glucose, UA NEGATIVE NEGATIVE mg/dL   Hgb urine dipstick NEGATIVE NEGATIVE   Bilirubin Urine NEGATIVE NEGATIVE   Ketones, ur NEGATIVE NEGATIVE mg/dL   Protein, ur NEGATIVE NEGATIVE mg/dL   Nitrite NEGATIVE NEGATIVE   Leukocytes,Ua LARGE (A) NEGATIVE   RBC / HPF 0-5 0 - 5 RBC/hpf   WBC, UA >50 (H) 0 - 5 WBC/hpf   Bacteria, UA FEW (A) NONE SEEN   Squamous Epithelial / LPF 0-5 0 - 5   Mucus PRESENT    Non Squamous Epithelial 0-5 (A) NONE SEEN    Comment: Performed at [facility], [place] [address], [place], [place] [address]  Urine rapid drug screen (hosp performed)     Status: Abnormal   Collection Time: 02/08/19  9:32 PM  Result Value Ref Range   Opiates NONE DETECTED NONE DETECTED   Cocaine NONE DETECTED NONE DETECTED   Benzodiazepines POSITIVE (A) NONE DETECTED   Amphetamines NONE DETECTED NONE DETECTED   Tetrahydrocannabinol NONE DETECTED NONE DETECTED   Barbiturates NONE DETECTED NONE DETECTED    Comment: (NOTE) DRUG SCREEN FOR MEDICAL PURPOSES ONLY.  IF CONFIRMATION IS NEEDED FOR ANY PURPOSE, NOTIFY LAB WITHIN 5 DAYS. LOWEST DETECTABLE LIMITS FOR URINE DRUG SCREEN Drug Class                     Cutoff (ng/mL) Amphetamine and metabolites    1000 Barbiturate and metabolites    200 Benzodiazepine                 [id number] Tricyclics and metabolites     300 Opiates and metabolites        300 Cocaine and metabolites        300 THC                            50 Performed at [facility], [place] [address] [place], [place] [address]   Troponin I (High Sensitivity)     Status: None   Collection Time: 02/08/19 10:28 PM  Result Value Ref Range   Troponin I (High Sensitivity) 6 <18 ng/L    Comment: (NOTE) Elevated high sensitivity troponin I (hsTnI) values and significant  changes across  serial measurements may suggest ACS but many other  chronic and acute conditions are  known to elevate hsTnI results.  Refer to the "Links" section for chest pain algorithms and additional  guidance. Performed at [facility], [place] [address], [place], [place] [address]    Ct Head Wo Contrast  Result Date: 02/08/2019 CLINICAL DATA:  Altered level of consciousness EXAM: CT HEAD WITHOUT CONTRAST TECHNIQUE: Contiguous axial images were obtained from the base of the skull through the vertex without intravenous contrast. COMPARISON:  Sep 04, 2004, MR head November 16, 2008. FINDINGS: Brain: No evidence of acute territorial infarction, hemorrhage, hydrocephalus,extra-axial collection or mass lesion/mass effect. There is dilatation the ventricles and sulci consistent with age-related atrophy. Low-attenuation changes in the deep white matter consistent with small vessel ischemia. Lacunar infarct is again noted within the right internal capsule and left basal ganglia. Vascular: No hyperdense vessel or unexpected calcification. Skull: The skull is intact. No fracture or focal lesion identified. Sinuses/Orbits: The visualized paranasal sinuses and mastoid air cells are clear. The orbits and globes intact. Other: None IMPRESSION: No acute intracranial abnormality. Findings consistent with age related atrophy and chronic small vessel ischemia Bilateral lacunar infarcts. Electronically Signed   By: Prudencio Pair M.D.   On: 02/08/2019 20:23    Pending Labs Unresulted Labs (From admission, onward)    Start     Ordered   02/08/19 2231  Urine culture  Add-on,   AD     02/08/19 2231   02/08/19 2230  SARS CORONAVIRUS 2 (TAT 6-24 HRS) Nasopharyngeal Nasopharyngeal Swab  (Asymptomatic/Tier 2 Patients Labs)  Once,   STAT    Question Answer Comment  Is this test for diagnosis or screening Screening   Symptomatic for COVID-19 as defined by CDC No   Hospitalized for COVID-19 No   Admitted to ICU for COVID-19 No    Previously tested for COVID-19 No   Resident in a congregate (group) care setting No   Employed in healthcare setting No   Pregnant No      02/08/19 2230          Vitals/Pain Today's Vitals   02/08/19 2000 02/08/19 2200 02/08/19 2322 02/08/19 2330  BP: (!) 143/73 (!) 154/76 (!) 171/74 (!) 165/148  Pulse: 68 65 64 64  Resp: (!) 25 (!) 24 18 16   Temp:      TempSrc:      SpO2: 93% 94% 97% 98%  Weight:      Height:      PainSc:        Isolation Precautions No active isolations  Medications Medications  cefTRIAXone (ROCEPHIN) 1 g in sodium chloride 0.9 % 100 mL IVPB (1 g Intravenous New Bag/Given 02/08/19 2327)    Mobility walks with person assist Low fall risk   Focused Assessments  GU   R Recommendations: See Admitting Provider Note  Report given to:   Additional Notes: -

## 2019-02-09 NOTE — Plan of Care (Signed)
Patient stable, discussed POC with patient and daughter in law, agreeable with plan, denies question/concerns at this time.

## 2019-02-10 LAB — URINE CULTURE: Culture: 10000 — AB

## 2019-02-27 ENCOUNTER — Encounter: Payer: Self-pay | Admitting: Oncology

## 2019-02-28 ENCOUNTER — Inpatient Hospital Stay: Payer: Medicare HMO | Attending: Adult Health

## 2019-02-28 ENCOUNTER — Telehealth: Payer: Self-pay

## 2019-02-28 ENCOUNTER — Inpatient Hospital Stay: Payer: Medicare HMO | Admitting: Adult Health

## 2019-02-28 ENCOUNTER — Other Ambulatory Visit: Payer: Self-pay | Admitting: Oncology

## 2019-02-28 NOTE — Telephone Encounter (Signed)
Called patient in regards to missed appt today.  LPN spoke to daughter, [name].  [name] informed me that a diagnostic mammogram and sonogram was performed at [facility] yesterday.  She said that [name] has scheduled a biopsy for 03/27/19 due to multiple masses found in breast.  [name] wants to know why is an appt necessary now instead of after biopsy.  LPN will clarify what is expected and call patient daughter back.  Daughter voiced understanding.

## 2019-02-28 NOTE — Telephone Encounter (Signed)
Called and spoke with patient daughter [name], about the biopsy delay.  I have placed a call to [facility] the navigator at the breast center to see if patient biopsy can be expedited, as 12/8 is too far out for her breast biopsy.  Will await a call back and once we get the biopsy scheduled for a sooner date, will put in an appointment request for a couple of days after the biopsy to review the results.  Daughter verbalized understanding and voiced appreciation.    Wilber Bihari, NP

## 2019-03-01 ENCOUNTER — Other Ambulatory Visit: Payer: Self-pay | Admitting: Adult Health

## 2019-03-01 DIAGNOSIS — C50412 Malignant neoplasm of upper-outer quadrant of left female breast: Secondary | ICD-10-CM

## 2019-03-01 DIAGNOSIS — Z170 Estrogen receptor positive status [ER+]: Secondary | ICD-10-CM

## 2019-03-09 ENCOUNTER — Ambulatory Visit
Admission: RE | Admit: 2019-03-09 | Discharge: 2019-03-09 | Disposition: A | Discharge status: Home / Self Care | Payer: Medicare HMO | Source: Ambulatory Visit | Attending: Adult Health | Admitting: Adult Health

## 2019-03-09 ENCOUNTER — Other Ambulatory Visit: Payer: Self-pay

## 2019-03-09 DIAGNOSIS — C50412 Malignant neoplasm of upper-outer quadrant of left female breast: Secondary | ICD-10-CM

## 2019-03-09 DIAGNOSIS — Z170 Estrogen receptor positive status [ER+]: Secondary | ICD-10-CM

## 2019-03-12 ENCOUNTER — Telehealth: Payer: Self-pay | Admitting: Oncology

## 2019-03-12 NOTE — Telephone Encounter (Signed)
Changed appt per 11/23 sch message - unable to reach pt . Left message for patient about changes

## 2019-03-18 NOTE — Progress Notes (Addendum)
[facility]  [phone] [phone]     ID: [name] DOB: 1930/02/22  MR#: [id number]  SHF#:[id number]  Patient Care Team: Drake Leach, MD as PCP - General (Internal Medicine) Leonie Man, MD as PCP - Cardiology (Cardiology) , Virgie Dad, MD as Consulting Physician (Oncology) Erroll Luna, MD as Consulting Physician (General Surgery) Leonie Man, MD as Consulting Physician (Cardiology) Juanita Craver, MD as Consulting Physician (Gastroenterology) Eppie Gibson, MD as Attending Physician (Radiation Oncology) Gardenia Phlegm, NP as Nurse Practitioner (Hematology and Oncology) OTHER MD:  I connected with [name] on 03/19/19 at 12:30 PM EST by telephone visit and verified that I am speaking with the correct person using two identifiers.   I discussed the limitations, risks, security and privacy concerns of performing an evaluation and management service by telemedicine and the availability of in-person appointments. I also discussed with the patient that there may be a patient responsible charge related to this service. The patient expressed understanding and agreed to proceed.   Other persons participating in the visit and their role in the encounter: Suzi Roots, daughter, and one of the patient's daughters in law who is a nurse  Patients location: home  Providers location: [place]    CHIEF COMPLAINT: Estrogen receptor positive breast [name]  CURRENT TREATMENT: To start anastrozole   INTERVAL HISTORY: [name] is contacted today for follow-up of her estrogen receptor positive breast [name]. Her daughter Suzi Roots participated by speaker phone.  Since her last visit, she was taken to the ED on 02/08/2019 via EMS after her medical alert necklace triggered an alarm. She was found down at home with questionable loss of consiousness and some slurred speech. She underwent head CT in the ED  that day, which showed: no acute intracranial abnormality.  She was admitted for observation and underwent brain MRI the following day. This showed: aging brain without acute or reversible finding.  She presented with a new left breast lump and underwent left diagnostic mammography and left breast ultrasonography at [facility] on 02/27/2019. This showed: breast density category: C; three suspicious masses measuring 0.6 cm, 0.5 cm, and 0.2 cm in the left breast at 3 o'clock 4-5 cm from the nipple. Axilla was not evaluated due to patient inability to raise arm completely.  Accordingly, she proceeded to biopsy of two of the left breast areas in question on 03/09/2019. Pathology from the procedure [id number]) revealed: 1. Left Breast, Outer 3 o'clock, 5 cmfn with skin involvement  - invasive mammary carcinoma, grade 2, e-cadherin positive  - ER: 100% positive, PR: 100% positive, both with strong staining intensity, Her2 negative (1+), Ki67: 10%. 2. Left Breast, 3 o'clock, 4 cmfn  - invasive mammary carcinoma, grade 2, e-cadherin positive  - ER: 100% positive with strong staining intensity, PR: 100% positive with moderate staining intensity, Her2 equivocal (2+), Ki67: 10%. FISH results pending   REVIEW OF SYSTEMS: [name] felt a little bit tired after her biopsies but has since recovered.  She did not have any unusual bleeding or pain issues.  She has had no further syncopal episodes.  She is taking appropriate pandemic precautions.  She had no other complaints.  HISTORY OF CURRENT ILLNESS: From the original intake note:  [name] had an annual wellness check up on 06/22/2017. Her PCP palpated a possible mass in the left breast and recommended that she have a diagnostic mammogram. She underwent bilateral diagnostic mammography with tomography and left breast  ultrasonography at [facility] on 07/05/2017 showing: breast density category C. There is an irregular asymmetry in the left breast the 5 o'clock  position lower outer quadrant. Sonographically, there was a mass in the 2 o'clock upper outer position anterior depth of the left breast measuring 2 cm. No abnormalities were found in the left axilla.   Accordingly on 07/06/2017 she proceeded to biopsy of the left breast area in question. The pathology from this procedure showed ([id number]): Invasive ductal carcinoma, grade II.  Also ductal carcinoma in situ. Prognostic indicators significant for: estrogen receptor, 100% positive and progesterone receptor, 100% positive, both with strong staining intensity. Proliferation marker Ki67 at 12%. HER2 not amplified with ratios HER2/CEP17 signals 1.37 and average HER2 copies per cell 1.85  The patient's subsequent history is as detailed below.   PAST MEDICAL HISTORY: Past Medical History:  Diagnosis Date   Abdominal aortic aneurysm ([place])    asymptomatic, 2012 -  2.6 x 2.4cm   Anxiety    CAD S/P percutaneous coronary angioplasty    Cath 2010: 100% occlusion of RCA  (2 BMS stents finally occluded) w/ left to right collaterals; moderate LAD and circumflex disease.  EF 55-60%;; cath 2015 with medical therapy recommended    Chronic lower back pain    Daily headache        Depression    Dyspnea    on exertion sometimes   Exertional shortness of breath    GERD (gastroesophageal reflux disease)    History of kidney stones    History of stomach ulcers    Hyperlipidemia    Hypertension    Hypothyroidism    Macular degeneration    "started out dry; now is wet" (01/08/2013)   Migraines        Myocardial infarction ([place]) 2003   PAD (peripheral artery disease) ([place]) 12/2005   s/p L Common Iliac Stent   PONV (postoperative nausea and vomiting)    Walking pneumonia 1970's  Legally blind   PAST SURGICAL HISTORY: Past Surgical History:  Procedure Laterality Date   ABD AOTRA DOPPLER  08/14/2012   distal mild dilatation 2.6 x 2.5cm unchanged;right cia 70-99%  new finding    Abdominal Aorta Duplex  09/2014   No change from prior study; 2.6 x 2.4 cm mild AAA. R Com Iliac A  > 50%. -- f/u 24 months.   ABDOMINAL AORTAGRAM  01/08/2013   Procedure: ABDOMINAL Maxcine Ham;  Surgeon: Lorretta Harp, MD;  Location[facility] C[facility] LAB;  Service: Cardiovascular;;   ANGIOPLASTY Right 01/08/2013   unsucess stenting RLE    APPENDECTOMY     BREAST LUMPECTOMY WITH RADIOACTIVE SEED LOCALIZATION Left 08/23/2017   Procedure: BREAST LUMPECTOMY WITH RADIOACTIVE SEED LOCALIZATION;  Surgeon: Erroll Luna, MD;  Location: [place];  Service: General;  Laterality: Left;   CARDIAC CATHETERIZATION  11/2008   diffuse ISR in  RCA w/ good L-R collaterals; AV groove Cx - 60-70% stenosis with post stenosis ectasia;  EF 55%, mild  CAD in LAD -- Med Rx   CATARACT EXTRACTION W/ INTRAOCULAR LENS  IMPLANT, BILATERAL Bilateral    CHOLECYSTECTOMY     CORONARY ANGIOPLASTY  April 2006   Cutting Balloon PTCA of RCA ISR   CORONARY ANGIOPLASTY WITH STENT PLACEMENT  Jan 2004   RCA - 2 overlapping Pixel BMS 2.0 mm x 18 mm & 2.0 mm x 23 m    DILATION AND CURETTAGE OF UTERUS     "couple over the years" (01/08/2013)   ILIAC ARTERY STENT Left  12/30/2005   PTA anddirect stenting  of left common iliac artery - 8.0 x 18 mm OmniLink stent   ILIAC ARTERY STENT Right 02/13/2013   successful diamondback orbital rotation lipectomy, PTA and stenting of highly calcified ostial common iliac artery/notes 02/13/2013   LEFT HEART CATHETERIZATION WITH CORONARY ANGIOGRAM N/A 11/13/2013   Procedure: LEFT HEART CATHETERIZATION WITH CORONARY ANGIOGRAM;  Surgeon: Leonie Man, MD; LAD 40%, CFX 60%, OM1 60%, RCA diffuse in-stent restenosis up to 99%, small PDA and PLA, collaterals from left system noted, EF 55-60 percent   LOWER EXTREMITY ANGIOGRAM N/A 01/08/2013   Procedure: LOWER EXTREMITY ANGIOGRAM;  Surgeon: Lorretta Harp, MD;  Location: [facility] CATH LAB;  Service: Cardiovascular;  Laterality: N/A;   NM MYOCAR PERF WALL  MOTION  12/27/2011 -[place]   LV normal,EF 81 %   NM MYOCAR PERF WALL MOTION  01/2015   EF 56%. Normal ROM of low risk. Small defect of mild severity and apical anterior location likely related to breast attenuation. Cannot rule out small area of mild ischemia.   TRANSTHORACIC ECHOCARDIOGRAM  10/2013   Normal LV size low normal function. EF 50-55%. No regional wall motion abnormality. GR 1 DD. PAP ~ 47 mmHg    FAMILY HISTORY Family History  Problem Relation Age of Onset   Diabetes Father    Breast [name] Neg Hx    Ovarian [name] Neg Hx    The patient's father died at age 61 due to diabetes and heart issues. The patient's mother died at age 14 due to old age. The patient has 6 brothers and 2 sisters. She denies a family history of breast or ovarian [name].   GYNECOLOGIC HISTORY:  No LMP recorded. Patient is postmenopausal. Menarche: 85 years old Age at first live birth: 85 years old She is GXP4.  Her LMP was at age 56.  She never used contraceptives. 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 She is widowed and lives by herself, with no pets. Daughter, [name], lives in [place] and is retired. Son, Juanda Crumble, lives in [place] and is a Administrator. Son, Coralyn Mark, lives in [place] and is a Freight forwarder. The patient's son, Cecilie Lowers died only a few months ago from a sudden intracranial bleed. The patient has 6 grandchildren and 1 great-grandchild. She attends [employer] in [place].     ADVANCED DIRECTIVES: Suzi Roots (her daughter) is healthcare power of attorney   HEALTH MAINTENANCE: Social History   Tobacco Use   Smoking status: Former Smoker    Packs/day: 1.00    Years: 40.00    Pack years: 40.00    Types: Cigarettes    Quit date: 09/18/1987    Years since quitting: 31.5   Smokeless tobacco: Never Used  Substance Use Topics   Alcohol use: No   Drug use: No     Colonoscopy: 2009  under Dr. Collene Mares  PAP: 10+ years ago  Bone density: 09/06/2017 T score -4.0   Allergies  Allergen Reactions   Avastin [Bevacizumab] Other (See Comments)    Muscle pain   Benadryl [Diphenhydramine] Palpitations and Other (See Comments)    TACHYCARDIA   Hydrocodone-Acetaminophen Nausea And Vomiting and Other (See Comments)    Delirium   Levofloxacin Other (See Comments)    Myalgias   Statins Other (See Comments)    Myalgias   Beta Adrenergic Blockers Other (See Comments)  Fatigue    Trazodone Rash and Other (See Comments)    "TERRIBLE SIDE EFFECTS"- Hallucinations and stopped her from urinating (also)    Current Outpatient Medications  Medication Sig Dispense Refill   ALPRAZolam (XANAX) 1 MG tablet Take 1 mg by mouth at bedtime.     amLODipine (NORVASC) 2.5 MG tablet Take 1 tablet (2.5 mg total) by mouth daily. 90 tablet 3   Cholecalciferol (VITAMIN D-3) 5000 units TABS Take 5,000 Units by mouth daily.     famotidine (PEPCID AC MAXIMUM STRENGTH) 20 MG tablet Take 20 mg by mouth daily as needed (when Omeprazole isn't controlling heartburn or ingestion symptoms).      levothyroxine (SYNTHROID, LEVOTHROID) 100 MCG tablet Take 100 mcg by mouth daily before breakfast.      lovastatin (MEVACOR) 20 MG tablet Take 20 mg by mouth at bedtime.      nitroGLYCERIN (NITROSTAT) 0.4 MG SL tablet Place 1 tablet (0.4 mg total) under the tongue every 5 (five) minutes as needed for chest pain. 25 tablet 4   Omega-3 Fatty Acids (FISH OIL) 1000 MG CAPS Take 1,000 mg by mouth daily.      omeprazole (PRILOSEC) 40 MG capsule Take 40 mg by mouth daily before breakfast.     Polyethyl Glycol-Propyl Glycol (LUBRICANT EYE DROPS) 0.4-0.3 % SOLN Place 1 drop into both eyes as needed (for dryness).      sertraline (ZOLOFT) 100 MG tablet Take 100 mg by mouth daily.      trandolapril (MAVIK) 2 MG tablet Take 1 tablet (2 mg total) by mouth daily. 30 tablet 0   No current facility-administered  medications for this visit.     OBJECTIVE: Older white woman in no acute distress  There were no vitals filed for this visit.   There is no height or weight on file to calculate BMI.   Wt Readings from Last 3 Encounters:  02/08/19 140 lb (63.5 kg)  10/19/18 145 lb (65.8 kg)  04/09/18 146 lb (66.2 kg)      ECOG FS:2 - Symptomatic, <50% confined to bed   Tele visit   LAB RESULTS:  CMP     Component Value Date/Time   NA 141 02/09/2019 0445   K 3.7 02/09/2019 0445   CL 107 02/09/2019 0445   CO2 23 02/09/2019 0445   GLUCOSE 95 02/09/2019 0445   BUN 16 02/09/2019 0445   CREATININE 0.64 02/09/2019 0445   CREATININE 0.81 02/21/2018 1123   CREATININE 0.73 11/07/2013 1346   CALCIUM 9.1 02/09/2019 0445   PROT 6.5 02/09/2019 0445   ALBUMIN 3.9 02/09/2019 0445   AST 23 02/09/2019 0445   AST 16 02/21/2018 1123   ALT 19 02/09/2019 0445   ALT 12 02/21/2018 1123   ALKPHOS 68 02/09/2019 0445   BILITOT 0.6 02/09/2019 0445   BILITOT 0.4 02/21/2018 1123   GFRNONAA >60 02/09/2019 0445   GFRNONAA >60 02/21/2018 1123   GFRAA >60 02/09/2019 0445   GFRAA >60 02/21/2018 1123    No results found for: TOTALPROTELP, ALBUMINELP, A1GS, A2GS, BETS, BETA2SER, GAMS, MSPIKE, SPEI  No results found for: KPAFRELGTCHN, LAMBDASER, KAPLAMBRATIO  Lab Results  Component Value Date   WBC 6.0 02/09/2019   NEUTROABS 3.3 02/09/2019   HGB 13.4 02/09/2019   HCT 40.7 02/09/2019   MCV 99.5 02/09/2019   PLT 156 02/09/2019    No results found for: LABCA2  No components found for: IHWTUU828  No results for input(s): INR in the last 168 hours.  No  results found for: LABCA2  No results found for: XBM841  No results found for: LKG401  No results found for: UUV253  No results found for: CA2729  No components found for: HGQUANT  No results found for: CEA1 / No results found for: CEA1   No results found for: AFPTUMOR  No results found for: CHROMOGRNA  No results found for: PSA1  No visits  with results within 3 Day(s) from this visit.  Latest known visit with results is:  Admission on 02/08/2019, Discharged on 02/09/2019  Component Date Value Ref Range Status   Glucose-Capillary 02/08/2019 89  70 - 99 mg/dL Final   Sodium 02/08/2019 140  135 - 145 mmol/L Final   Potassium 02/08/2019 4.1  3.5 - 5.1 mmol/L Final   Chloride 02/08/2019 108  98 - 111 mmol/L Final   CO2 02/08/2019 21* 22 - 32 mmol/L Final   Glucose, Bld 02/08/2019 100* 70 - 99 mg/dL Final   BUN 02/08/2019 19  8 - 23 mg/dL Final   Creatinine, Ser 02/08/2019 0.67  0.44 - 1.00 mg/dL Final   Calcium 02/08/2019 9.1  8.9 - 10.3 mg/dL Final   Total Protein 02/08/2019 6.2* 6.5 - 8.1 g/dL Final   Albumin 02/08/2019 3.9  3.5 - 5.0 g/dL Final   AST 02/08/2019 27  15 - 41 U/L Final   ALT 02/08/2019 20  0 - 44 U/L Final   Alkaline Phosphatase 02/08/2019 72  38 - 126 U/L Final   Total Bilirubin 02/08/2019 0.8  0.3 - 1.2 mg/dL Final   GFR calc non Af Amer 02/08/2019 >60  >60 mL/min Final   GFR calc Af Amer 02/08/2019 >60  >60 mL/min Final   Anion gap 02/08/2019 11  5 - 15 Final   Performed at [facility], [place] [address] [place], [place] [address]   WBC 02/08/2019 6.2  4.0 - 10.5 K/uL Final   RBC 02/08/2019 4.01  3.87 - 5.11 MIL/uL Final   Hemoglobin 02/08/2019 13.0  12.0 - 15.0 g/dL Final   HCT 02/08/2019 40.5  36.0 - 46.0 % Final   MCV 02/08/2019 101.0* 80.0 - 100.0 fL Final   MCH 02/08/2019 32.4  26.0 - 34.0 pg Final   MCHC 02/08/2019 32.1  30.0 - 36.0 g/dL Final   RDW 02/08/2019 12.4  11.5 - 15.5 % Final   Platelets 02/08/2019 164  150 - 400 K/uL Final   nRBC 02/08/2019 0.0  0.0 - 0.2 % Final   Neutrophils Relative % 02/08/2019 61  % Final   Neutro Abs 02/08/2019 3.7  1.7 - 7.7 K/uL Final   Lymphocytes Relative 02/08/2019 27  % Final   Lymphs Abs 02/08/2019 1.7  0.7 - 4.0 K/uL Final   Monocytes Relative 02/08/2019 10  % Final   Monocytes Absolute 02/08/2019 0.6  0.1 - 1.0  K/uL Final   Eosinophils Relative 02/08/2019 2  % Final   Eosinophils Absolute 02/08/2019 0.2  0.0 - 0.5 K/uL Final   Basophils Relative 02/08/2019 0  % Final   Basophils Absolute 02/08/2019 0.0  0.0 - 0.1 K/uL Final   Immature Granulocytes 02/08/2019 0  % Final   Abs Immature Granulocytes 02/08/2019 0.02  0.00 - 0.07 K/uL Final   Performed at [facility], [place] [address], [place], [place] [address]   Color, Urine 02/08/2019 YELLOW  YELLOW Final   APPearance 02/08/2019 HAZY* CLEAR Final   Specific Gravity, Urine 02/08/2019 1.019  1.005 - 1.030 Final   pH  02/08/2019 5.0  5.0 - 8.0 Final   Glucose, UA 02/08/2019 NEGATIVE  NEGATIVE mg/dL Final   Hgb urine dipstick 02/08/2019 NEGATIVE  NEGATIVE Final   Bilirubin Urine 02/08/2019 NEGATIVE  NEGATIVE Final   Ketones, ur 02/08/2019 NEGATIVE  NEGATIVE mg/dL Final   Protein, ur 02/08/2019 NEGATIVE  NEGATIVE mg/dL Final   Nitrite 02/08/2019 NEGATIVE  NEGATIVE Final   Leukocytes,Ua 02/08/2019 LARGE* NEGATIVE Final   RBC / HPF 02/08/2019 0-5  0 - 5 RBC/hpf Final   WBC, UA 02/08/2019 >50* 0 - 5 WBC/hpf Final   Bacteria, UA 02/08/2019 FEW* NONE SEEN Final   Squamous Epithelial / LPF 02/08/2019 0-5  0 - 5 Final   Mucus 02/08/2019 PRESENT   Final   Non Squamous Epithelial 02/08/2019 0-5* NONE SEEN Final   Performed at [facility], [place] [address] [place], [place] [address]   Lactic Acid, Venous 02/08/2019 1.2  0.5 - 1.9 mmol/L Final   Performed at [facility] [address] [place], [place] [address]   Sodium 02/08/2019 141  135 - 145 mmol/L Final   Potassium 02/08/2019 4.1  3.5 - 5.1 mmol/L Final   Chloride 02/08/2019 108  98 - 111 mmol/L Final   BUN 02/08/2019 25* 8 - 23 mg/dL Final   Creatinine, Ser 02/08/2019 0.60  0.44 - 1.00 mg/dL Final   Glucose, Bld 02/08/2019 96  70 - 99 mg/dL Final   Calcium, Ion 02/08/2019 1.16  1.15 - 1.40 mmol/L Final   TCO2 02/08/2019 23  22 - 32 mmol/L Final    Hemoglobin 02/08/2019 13.3  12.0 - 15.0 g/dL Final   HCT 02/08/2019 39.0  36.0 - 46.0 % Final   Alcohol, Ethyl (B) 02/08/2019 <10  <10 mg/dL Final   Comment: (NOTE) Lowest detectable limit for serum alcohol is 10 mg/dL. For medical purposes only. Performed at [facility], [place] [address], [place], [place] [address]    Opiates 02/08/2019 NONE DETECTED  NONE DETECTED Final   Cocaine 02/08/2019 NONE DETECTED  NONE DETECTED Final   Benzodiazepines 02/08/2019 POSITIVE* NONE DETECTED Final   Amphetamines 02/08/2019 NONE DETECTED  NONE DETECTED Final   Tetrahydrocannabinol 02/08/2019 NONE DETECTED  NONE DETECTED Final   Barbiturates 02/08/2019 NONE DETECTED  NONE DETECTED Final   Comment: (NOTE) DRUG SCREEN FOR MEDICAL PURPOSES ONLY.  IF CONFIRMATION IS NEEDED FOR ANY PURPOSE, NOTIFY LAB WITHIN 5 DAYS. LOWEST DETECTABLE LIMITS FOR URINE DRUG SCREEN Drug Class                     Cutoff (ng/mL) Amphetamine and metabolites    1000 Barbiturate and metabolites    200 Benzodiazepine                 149 Tricyclics and metabolites     300 Opiates and metabolites        300 Cocaine and metabolites        300 THC                            50 Performed at [facility], [place] [address], [place],  [address]    Troponin I (High Sensitivity) 02/08/2019 6  <18 ng/L Final   Comment: (NOTE) Elevated high sensitivity troponin I (hsTnI) values and significant  changes across serial measurements may suggest ACS but many other  chronic and acute conditions are known to elevate hsTnI results.  Refer to  the "Links" section for chest pain algorithms and additional  guidance. Performed at [facility], [place] [address], [place], [place] [address]    Prothrombin Time 02/08/2019 13.3  11.4 - 15.2 seconds Final   INR 02/08/2019 1.0  0.8 - 1.2 Final   Comment: (NOTE) INR goal varies based on device and disease states. Performed at [facility], [place] [address]  [place], [place] [address]    Troponin I (High Sensitivity) 02/08/2019 6  <18 ng/L Final   Comment: (NOTE) Elevated high sensitivity troponin I (hsTnI) values and significant  changes across serial measurements may suggest ACS but many other  chronic and acute conditions are known to elevate hsTnI results.  Refer to the "Links" section for chest pain algorithms and additional  guidance. Performed at [facility], [place] [address], [place], [place] [address]    SARS Coronavirus 2 02/08/2019 NEGATIVE  NEGATIVE Final   Comment: (NOTE) SARS-CoV-2 target nucleic acids are NOT DETECTED. The SARS-CoV-2 RNA is generally detectable in upper and lower respiratory specimens during the acute phase of infection. Negative results do not preclude SARS-CoV-2 infection, do not rule out co-infections with other pathogens, and should not be used as the sole basis for treatment or other patient management decisions. Negative results must be combined with clinical observations, patient history, and epidemiological information. The expected result is Negative. Fact Sheet for Patients: SugarRoll.be Fact Sheet for Healthcare Providers: https://www.woods-mathews.com/ This test is not yet approved or cleared by the [place] FDA and  has been authorized for detection and/or diagnosis of SARS-CoV-2 by FDA under an Emergency Use Authorization (EUA). This EUA will remain  in effect (meaning this test can be used) for the duration of the COVID-19 declaration under Section 56                          4(b)(1) of the Act, 21 U.S.C. section 360bbb-3(b)(1), unless the authorization is terminated or revoked sooner. Performed at [facility], [place] [address] [place], [place] [address]    Specimen Description 02/08/2019 URINE, RANDOM   Final   Special Requests 02/08/2019    Final                   Value:NONE Performed at [facility], [place] [address], [place], [place] [address]    Culture 02/08/2019 10,000 COLONIES/mL ESCHERICHIA COLI*  Final   Report Status 02/08/2019 02/10/2019 FINAL   Final   Organism ID, Bacteria 02/08/2019 ESCHERICHIA COLI*  Final   Sodium 02/09/2019 141  135 - 145 mmol/L Final   Potassium 02/09/2019 3.7  3.5 - 5.1 mmol/L Final   Chloride 02/09/2019 107  98 - 111 mmol/L Final   CO2 02/09/2019 23  22 - 32 mmol/L Final   Glucose, Bld 02/09/2019 95  70 - 99 mg/dL Final   BUN 02/09/2019 16  8 - 23 mg/dL Final   Creatinine, Ser 02/09/2019 0.64  0.44 - 1.00 mg/dL Final   Calcium 02/09/2019 9.1  8.9 - 10.3 mg/dL Final   Total Protein 02/09/2019 6.5  6.5 - 8.1 g/dL Final   Albumin 02/09/2019 3.9  3.5 - 5.0 g/dL Final   AST 02/09/2019 23  15 - 41 U/L Final   ALT 02/09/2019 19  0 - 44 U/L Final   Alkaline Phosphatase 02/09/2019 68  38 - 126 U/L Final   Total Bilirubin 02/09/2019 0.6  0.3 - 1.2 mg/dL  Final   GFR calc non Af Amer 02/09/2019 >60  >60 mL/min Final   GFR calc Af Amer 02/09/2019 >60  >60 mL/min Final   Anion gap 02/09/2019 11  5 - 15 Final   Performed at [place] [address], [place], [place] [address]   WBC 02/09/2019 6.0  4.0 - 10.5 K/uL Final   RBC 02/09/2019 4.09  3.87 - 5.11 MIL/uL Final   Hemoglobin 02/09/2019 13.4  12.0 - 15.0 g/dL Final   HCT 02/09/2019 40.7  36.0 - 46.0 % Final   MCV 02/09/2019 99.5  80.0 - 100.0 fL Final   MCH 02/09/2019 32.8  26.0 - 34.0 pg Final   MCHC 02/09/2019 32.9  30.0 - 36.0 g/dL Final   RDW 02/09/2019 12.5  11.5 - 15.5 % Final   Platelets 02/09/2019 156  150 - 400 K/uL Final   nRBC 02/09/2019 0.0  0.0 - 0.2 % Final   Neutrophils Relative % 02/09/2019 55  % Final   Neutro Abs 02/09/2019 3.3  1.7 - 7.7 K/uL Final   Lymphocytes Relative 02/09/2019 32  % Final   Lymphs Abs 02/09/2019 1.9  0.7 - 4.0 K/uL Final   Monocytes Relative 02/09/2019 10  % Final   Monocytes Absolute 02/09/2019 0.6  0.1 - 1.0 K/uL Final    Eosinophils Relative 02/09/2019 2  % Final   Eosinophils Absolute 02/09/2019 0.1  0.0 - 0.5 K/uL Final   Basophils Relative 02/09/2019 1  % Final   Basophils Absolute 02/09/2019 0.0  0.0 - 0.1 K/uL Final   Immature Granulocytes 02/09/2019 0  % Final   Abs Immature Granulocytes 02/09/2019 0.02  0.00 - 0.07 K/uL Final   Performed at [facility], [place] [address], [place], [place] [address]   TSH 02/09/2019 2.746  0.350 - 4.500 uIU/mL Final   Comment: Performed by a 3rd Generation assay with a functional sensitivity of <=0.01 uIU/mL. Performed at [facility], [place] [address], [place], [place] [address]    Total CK 02/09/2019 46  38 - 234 U/L Final   Performed at [place] [address], [place], [place] [address]   Ammonia 02/09/2019 23  9 - 35 umol/L Final   Performed at [place] [address] [place], [place] [address]    (this displays the last labs from the last 3 days)  No results found for: TOTALPROTELP, ALBUMINELP, A1GS, A2GS, BETS, BETA2SER, GAMS, MSPIKE, SPEI (this displays SPEP labs)  No results found for: KPAFRELGTCHN, LAMBDASER, KAPLAMBRATIO (kappa/lambda light chains)  No results found for: HGBA, HGBA2QUANT, HGBFQUANT, HGBSQUAN (Hemoglobinopathy evaluation)   No results found for: LDH  No results found for: IRON, TIBC, IRONPCTSAT (Iron and TIBC)  No results found for: FERRITIN  Urinalysis    Component Value Date/Time   COLORURINE YELLOW 02/08/2019 2132   APPEARANCEUR HAZY (A) 02/08/2019 2132   LABSPEC 1.019 02/08/2019 2132   PHURINE 5.0 02/08/2019 2132   GLUCOSEU NEGATIVE 02/08/2019 2132   HGBUR NEGATIVE 02/08/2019 2132   BILIRUBINUR NEGATIVE 02/08/2019 2132   KETONESUR NEGATIVE 02/08/2019 2132   PROTEINUR NEGATIVE 02/08/2019 2132   NITRITE NEGATIVE 02/08/2019 2132   LEUKOCYTESUR LARGE (A) 02/08/2019 2132     STUDIES: Mm Clip Placement Left  Result Date: 03/09/2019 CLINICAL DATA:  Confirmation of clip  placement after ultrasound-guided core needle biopsy of 2 separate masses in the OUTER LEFT breast. EXAM: 2D AND TOMOSYNTHESIS DIAGNOSTIC LEFT MAMMOGRAM POST ULTRASOUND BIOPSY COMPARISON:  Previous exam(s). FINDINGS: Tomosynthesis and synthesized full field CC and mediolateral images were obtained following ultrasound guided biopsy of 2 separate masses involving the OUTER LEFT breast. The ribbon shaped tissue marking clip is appropriately position at the site of the biopsied mass in the OUTER LEFT breast (designated # 1 at the near 3 o'clock position approximately 5 cm from the nipple). This particular mass demonstrated skin involvement. The coil shaped tissue marker clip is a appropriately positioned at the site of the biopsied mass in the OUTER LEFT breast (designated # 2 at the near 3 o'clock position approximately 4 cm from the nipple). The clips are approximately 1.4 cm apart. Expected post biopsy changes are present at both sites without evidence of hematoma. IMPRESSION: 1. Appropriate positioning of the ribbon shaped tissue marking clip at the site of the biopsied mass in the OUTER LEFT breast at the near 3 o'clock position 5 cm from the nipple. 2. Appropriate positioning of the coil shaped tissue marking clip at the site of the biopsied mass in the OUTER LEFT breast at the near 3 o'clock position 4 cm from the nipple. 3. The clips are separated by approximately 1.4 cm. Final Assessment: Post Procedure Mammograms for Marker Placement Electronically Signed   By: Evangeline Dakin M.D.   On: 03/09/2019 16:53   [place] Lt Breast Bx W Loc Dev 1st Lesion Img Bx Spec US Guide  Addendum Date: 03/12/2019   ADDENDUM REPORT: 03/12/2019 13:06 ADDENDUM: Pathology revealed GRADE II INVASIVE MAMMARY CARCINOMA of the Left breast, both locations, 3 o'clock, 5cm fn with skin involvement and 3 o'clock, 4cm fn. This was found to be concordant by Dr. Peggye Fothergill. Pathology results were discussed with the patient's daughter,  Suzi Roots by telephone, per request. The patient's daughter reported her mother did well after the biopsies with tenderness at the sites. Post biopsy instructions and care were reviewed and questions were answered. The patient's daughter was encouraged to call [facility] for any additional concerns. The patient is scheduled to see Dr. Tressa Busman at [facility] on March 19, 2019. Pathology results were called to Isa Rankin at [facility] in [place], [place] on March 12, 2019. Pathology results reported by Terie Purser, RN on 03/12/2019. Electronically Signed   By: Evangeline Dakin M.D.   On: 03/12/2019 13:06   Result Date: 03/12/2019 CLINICAL DATA:  85 year old who underwent malignant lumpectomy of the LEFT breast in 2019. Patient did not wish to perform adjuvant radiation therapy. She discontinued hormonal chemoprevention after approximately 1 month due to side effects. She presented with a palpable lump in the OUTER LEFT breast and was shown on diagnostic workup at [facility] to have multiple masses in the OUTER LEFT breast. The larger 2 masses are sampled. These masses are approximately 1 cm apart and span in total approximately 2 cm. There are at least 2 smaller masses in between these dominant masses. EXAM: ULTRASOUND GUIDED LEFT BREAST CORE NEEDLE BIOPSY x 2 COMPARISON:  Previous exam(s). FINDINGS: I met with the patient and her daughter and we discussed the procedure of ultrasound-guided biopsy, including benefits and alternatives. We discussed the high likelihood of a successful procedure. We discussed the risks of the procedure, including infection, bleeding, tissue injury, clip migration, and inadequate sampling. Informed written consent was given. The usual time-out protocol was performed immediately prior to the procedure. # 1) Near 3 o'clock location, 5 cm from nipple, lesion quadrant: Slight UPPER OUTER QUADRANT. Initially,  using sterile technique with chlorhexidine as skin antisepsis, 1% Lidocaine as local anesthetic, under direct ultrasound visualization, a 12 gauge Bard Marquee core needle device placed through an 11 gauge introducer needle was used to perform biopsy of the mass at the near 3 o'clock location approximately 5 cm from the nipple with skin involvement using a lateral approach. At the conclusion of the procedure a ribbon shaped tissue marker clip was deployed into the biopsy cavity. # 2) Near 3 o'clock location, 4 cm from nipple, lesion quadrant: Slight UPPER OUTER QUADRANT. Next, using sterile technique with chlorhexidine as skin antisepsis, 1% lidocaine as local anesthetic, under direct ultrasound visualization, a 12 gauge Bard Marquee core needle device placed through an 11 gauge introducer needle was used perform biopsy of the mass at the near 3 o'clock location approximately 4 cm nipple using a lateral approach. I was able to use the same dermatotomy site for both biopsies. At the conclusion of the procedure a coil shaped tissue marker clip was deployed into the biopsy cavity. Follow up 2 view mammogram was performed and dictated separately. IMPRESSION: Ultrasound guided biopsy of 2 separate masses involving the OUTER LEFT breast. No apparent complications. Electronically Signed: By: Evangeline Dakin M.D. On: 03/09/2019 16:47   [place] Lt Breast Bx W Loc Dev Ea Add Lesion Img Bx Spec US Guide  Addendum Date: 03/12/2019   ADDENDUM REPORT: 03/12/2019 13:06 ADDENDUM: Pathology revealed GRADE II INVASIVE MAMMARY CARCINOMA of the Left breast, both locations, 3 o'clock, 5cm fn with skin involvement and 3 o'clock, 4cm fn. This was found to be concordant by Dr. Peggye Fothergill. Pathology results were discussed with the patient's daughter, Suzi Roots by telephone, per request. The patient's daughter reported her mother did well after the biopsies with tenderness at the sites. Post biopsy instructions and care were  reviewed and questions were answered. The patient's daughter was encouraged to call [facility] for any additional concerns. The patient is scheduled to see Dr. Tressa Busman at [facility] on March 19, 2019. Pathology results were called to Isa Rankin at [facility] in [place], [place] on March 12, 2019. Pathology results reported by Terie Purser, RN on 03/12/2019. Electronically Signed   By: Evangeline Dakin M.D.   On: 03/12/2019 13:06   Result Date: 03/12/2019 CLINICAL DATA:  85 year old who underwent malignant lumpectomy of the LEFT breast in 2019. Patient did not wish to perform adjuvant radiation therapy. She discontinued hormonal chemoprevention after approximately 1 month due to side effects. She presented with a palpable lump in the OUTER LEFT breast and was shown on diagnostic workup at [facility] to have multiple masses in the OUTER LEFT breast. The larger 2 masses are sampled. These masses are approximately 1 cm apart and span in total approximately 2 cm. There are at least 2 smaller masses in between these dominant masses. EXAM: ULTRASOUND GUIDED LEFT BREAST CORE NEEDLE BIOPSY x 2 COMPARISON:  Previous exam(s). FINDINGS: I met with the patient and her daughter and we discussed the procedure of ultrasound-guided biopsy, including benefits and alternatives. We discussed the high likelihood of a successful procedure. We discussed the risks of the procedure, including infection, bleeding, tissue injury, clip migration, and inadequate sampling. Informed written consent was given. The usual time-out protocol was performed immediately prior to the procedure. # 1) Near 3 o'clock location, 5 cm from nipple, lesion quadrant: Slight UPPER OUTER QUADRANT. Initially, using sterile technique with chlorhexidine as skin antisepsis, 1% Lidocaine as  local anesthetic, under direct ultrasound visualization, a 12 gauge Bard Marquee core needle device placed  through an 11 gauge introducer needle was used to perform biopsy of the mass at the near 3 o'clock location approximately 5 cm from the nipple with skin involvement using a lateral approach. At the conclusion of the procedure a ribbon shaped tissue marker clip was deployed into the biopsy cavity. # 2) Near 3 o'clock location, 4 cm from nipple, lesion quadrant: Slight UPPER OUTER QUADRANT. Next, using sterile technique with chlorhexidine as skin antisepsis, 1% lidocaine as local anesthetic, under direct ultrasound visualization, a 12 gauge Bard Marquee core needle device placed through an 11 gauge introducer needle was used perform biopsy of the mass at the near 3 o'clock location approximately 4 cm nipple using a lateral approach. I was able to use the same dermatotomy site for both biopsies. At the conclusion of the procedure a coil shaped tissue marker clip was deployed into the biopsy cavity. Follow up 2 view mammogram was performed and dictated separately. IMPRESSION: Ultrasound guided biopsy of 2 separate masses involving the OUTER LEFT breast. No apparent complications. Electronically Signed: By: Evangeline Dakin M.D. On: 03/09/2019 16:47     ELIGIBLE FOR AVAILABLE RESEARCH PROTOCOL: [employer] sciences study  ASSESSMENT: 85 y.o. [place], [place] woman status post left breast upper outer quadrant biopsy 07/06/2017 for a clinical T1c N0, stage IA invasive ductal carcinoma, grade 2, estrogen and progesterone receptor positive, HER-2 not amplified, with an MIB-1 of 12%  (1) status post left lumpectomy without sentinel lymph node sampling 08/23/2017 for a pT2 pNX, stage IIA invasive ductal carcinoma, grade 3, with a positive anterior margin (skin), and multiple close margins  (2) opted against adjuvant radiation   (3) letrozole started 09/02/2017, discontinued 09/30/2017 secondary to intolerance  RECURRENT DISEASE NOV 2020 (4) status post left breast biopsy x2 03/09/2019 both showing invasive ductal carcinoma,  grade 2, estrogen and progesterone receptor strongly positive, with an MIB-1 of 10%, HER-2 still in process   PLAN: Kathee had breast [name] recurrence just over a year out from her original lumpectomy.  She understands that the fact that she did not take antiestrogens or accept radiation increase the risk of this occurring.  Now that it has occurred the question is how to proceed.  1 thing I think she will definitely need his antiestrogens and today we discussed the possible toxicities side effects and complications of anastrozole.  I have gone ahead and placed that prescription for her and she will be starting it.  She will need a bone density and I will set her up for that in the next 2 or 3 weeks.  Note that the [facility] results for part 2 are still pending as of today.  Her case will be presented at conference and the question is whether we should proceed to lumpectomy mastectomy, or simply treat with antiestrogens 3 to 6 months or possibly longer.  Once we make that determination at conference that we will call her back  She knows to call for any other issue that may develop before then.  Martel Galvan, Virgie Dad, MD  03/19/19 8:57 AM Medical Oncology and Hematology [facility] [place], [place] [address] Tel. [phone]    Fax. [phone]   I, Wilburn Mylar, am acting as scribe for Dr. Virgie Dad. [name].  I, Lurline Del MD, have reviewed the above documentation for accuracy and completeness, and I agree with the above.  Addendum official report on Ms.  Weger's a second biopsy shows no HER-2 amplification with a signals ratio of 1.23 and the number per cell 1.85

## 2019-03-19 ENCOUNTER — Inpatient Hospital Stay: Payer: Medicare HMO

## 2019-03-19 ENCOUNTER — Inpatient Hospital Stay (HOSPITAL_BASED_OUTPATIENT_CLINIC_OR_DEPARTMENT_OTHER): Payer: Medicare HMO | Admitting: Oncology

## 2019-03-19 DIAGNOSIS — C50912 Malignant neoplasm of unspecified site of left female breast: Secondary | ICD-10-CM

## 2019-03-19 DIAGNOSIS — Z170 Estrogen receptor positive status [ER+]: Secondary | ICD-10-CM | POA: Diagnosis not present

## 2019-03-19 DIAGNOSIS — C50412 Malignant neoplasm of upper-outer quadrant of left female breast: Secondary | ICD-10-CM | POA: Diagnosis not present

## 2019-03-19 MED ORDER — ANASTROZOLE 1 MG PO TABS: 1.0000 mg | ORAL_TABLET | Freq: Every day | ORAL | 4 refills | Status: AC

## 2019-03-28 ENCOUNTER — Emergency Department (HOSPITAL_COMMUNITY): Payer: Medicare HMO

## 2019-03-28 ENCOUNTER — Inpatient Hospital Stay (HOSPITAL_COMMUNITY)
Admission: EM | Admit: 2019-03-28 | Discharge: 2019-04-20 | DRG: 082 | Disposition: E | Payer: Medicare HMO | Attending: General Surgery | Admitting: General Surgery

## 2019-03-28 ENCOUNTER — Other Ambulatory Visit: Payer: Self-pay

## 2019-03-28 ENCOUNTER — Encounter (HOSPITAL_COMMUNITY): Payer: Self-pay

## 2019-03-28 ENCOUNTER — Inpatient Hospital Stay (HOSPITAL_COMMUNITY): Payer: Medicare HMO

## 2019-03-28 DIAGNOSIS — S065X9A Traumatic subdural hemorrhage with loss of consciousness of unspecified duration, initial encounter: Secondary | ICD-10-CM | POA: Diagnosis present

## 2019-03-28 DIAGNOSIS — S022XXA Fracture of nasal bones, initial encounter for closed fracture: Secondary | ICD-10-CM | POA: Diagnosis present

## 2019-03-28 DIAGNOSIS — I2510 Atherosclerotic heart disease of native coronary artery without angina pectoris: Secondary | ICD-10-CM | POA: Diagnosis present

## 2019-03-28 DIAGNOSIS — R402363 Coma scale, best motor response, obeys commands, at hospital admission: Secondary | ICD-10-CM | POA: Diagnosis present

## 2019-03-28 DIAGNOSIS — S066X9A Traumatic subarachnoid hemorrhage with loss of consciousness of unspecified duration, initial encounter: Secondary | ICD-10-CM | POA: Diagnosis not present

## 2019-03-28 DIAGNOSIS — I619 Nontraumatic intracerebral hemorrhage, unspecified: Secondary | ICD-10-CM

## 2019-03-28 DIAGNOSIS — S02413A LeFort III fracture, initial encounter for closed fracture: Secondary | ICD-10-CM | POA: Diagnosis present

## 2019-03-28 DIAGNOSIS — S02411A LeFort I fracture, initial encounter for closed fracture: Secondary | ICD-10-CM | POA: Diagnosis present

## 2019-03-28 DIAGNOSIS — S02841A Fracture of lateral orbital wall, right side, initial encounter for closed fracture: Secondary | ICD-10-CM | POA: Diagnosis present

## 2019-03-28 DIAGNOSIS — R402352 Coma scale, best motor response, localizes pain, at arrival to emergency department: Secondary | ICD-10-CM | POA: Diagnosis present

## 2019-03-28 DIAGNOSIS — Z515 Encounter for palliative care: Secondary | ICD-10-CM | POA: Diagnosis not present

## 2019-03-28 DIAGNOSIS — Y92015 Private garage of single-family (private) house as the place of occurrence of the external cause: Secondary | ICD-10-CM | POA: Diagnosis not present

## 2019-03-28 DIAGNOSIS — R402242 Coma scale, best verbal response, confused conversation, at arrival to emergency department: Secondary | ICD-10-CM | POA: Diagnosis present

## 2019-03-28 DIAGNOSIS — S0083XA Contusion of other part of head, initial encounter: Secondary | ICD-10-CM | POA: Diagnosis not present

## 2019-03-28 DIAGNOSIS — H9190 Unspecified hearing loss, unspecified ear: Secondary | ICD-10-CM | POA: Diagnosis present

## 2019-03-28 DIAGNOSIS — F0390 Unspecified dementia without behavioral disturbance: Secondary | ICD-10-CM | POA: Diagnosis present

## 2019-03-28 DIAGNOSIS — S0219XA Other fracture of base of skull, initial encounter for closed fracture: Secondary | ICD-10-CM | POA: Diagnosis present

## 2019-03-28 DIAGNOSIS — R402132 Coma scale, eyes open, to sound, at arrival to emergency department: Secondary | ICD-10-CM | POA: Diagnosis present

## 2019-03-28 DIAGNOSIS — S069X9A Unspecified intracranial injury with loss of consciousness of unspecified duration, initial encounter: Secondary | ICD-10-CM | POA: Diagnosis present

## 2019-03-28 DIAGNOSIS — R402253 Coma scale, best verbal response, oriented, at hospital admission: Secondary | ICD-10-CM | POA: Diagnosis present

## 2019-03-28 DIAGNOSIS — W1830XA Fall on same level, unspecified, initial encounter: Secondary | ICD-10-CM | POA: Diagnosis present

## 2019-03-28 DIAGNOSIS — Z66 Do not resuscitate: Secondary | ICD-10-CM | POA: Diagnosis present

## 2019-03-28 DIAGNOSIS — I10 Essential (primary) hypertension: Secondary | ICD-10-CM | POA: Diagnosis present

## 2019-03-28 DIAGNOSIS — J9601 Acute respiratory failure with hypoxia: Secondary | ICD-10-CM | POA: Diagnosis not present

## 2019-03-28 DIAGNOSIS — Z20828 Contact with and (suspected) exposure to other viral communicable diseases: Secondary | ICD-10-CM | POA: Diagnosis present

## 2019-03-28 DIAGNOSIS — T68XXXA Hypothermia, initial encounter: Secondary | ICD-10-CM | POA: Diagnosis present

## 2019-03-28 DIAGNOSIS — S069XAA Unspecified intracranial injury with loss of consciousness status unknown, initial encounter: Secondary | ICD-10-CM | POA: Diagnosis present

## 2019-03-28 DIAGNOSIS — H1131 Conjunctival hemorrhage, right eye: Secondary | ICD-10-CM | POA: Diagnosis present

## 2019-03-28 DIAGNOSIS — W19XXXA Unspecified fall, initial encounter: Secondary | ICD-10-CM

## 2019-03-28 DIAGNOSIS — R402143 Coma scale, eyes open, spontaneous, at hospital admission: Secondary | ICD-10-CM | POA: Diagnosis present

## 2019-03-28 DIAGNOSIS — G9389 Other specified disorders of brain: Secondary | ICD-10-CM | POA: Diagnosis present

## 2019-03-28 DIAGNOSIS — S0292XA Unspecified fracture of facial bones, initial encounter for closed fracture: Secondary | ICD-10-CM

## 2019-03-28 LAB — PROTIME-INR
INR: 1.2 (ref 0.8–1.2)
Prothrombin Time: 14.6 seconds (ref 11.4–15.2)

## 2019-03-28 LAB — CBC WITH DIFFERENTIAL/PLATELET
Abs Immature Granulocytes: 0 10*3/uL (ref 0.00–0.07)
Basophils Absolute: 0 10*3/uL (ref 0.0–0.1)
Basophils Relative: 0 %
Eosinophils Absolute: 0 10*3/uL (ref 0.0–0.5)
Eosinophils Relative: 0 %
HCT: 41.6 % (ref 36.0–46.0)
Hemoglobin: 13.1 g/dL (ref 12.0–15.0)
Lymphocytes Relative: 14 %
Lymphs Abs: 3.9 10*3/uL (ref 0.7–4.0)
MCH: 32.6 pg (ref 26.0–34.0)
MCHC: 31.5 g/dL (ref 30.0–36.0)
MCV: 103.5 fL — ABNORMAL HIGH (ref 80.0–100.0)
Monocytes Absolute: 1.4 10*3/uL — ABNORMAL HIGH (ref 0.1–1.0)
Monocytes Relative: 5 %
Neutro Abs: 22.4 10*3/uL — ABNORMAL HIGH (ref 1.7–7.7)
Neutrophils Relative %: 81 %
Platelets: 200 10*3/uL (ref 150–400)
RBC: 4.02 MIL/uL (ref 3.87–5.11)
RDW: 12.3 % (ref 11.5–15.5)
WBC: 27.6 10*3/uL — ABNORMAL HIGH (ref 4.0–10.5)
nRBC: 0 % (ref 0.0–0.2)
nRBC: 0 /100 WBC

## 2019-03-28 LAB — CDS SEROLOGY

## 2019-03-28 LAB — COMPREHENSIVE METABOLIC PANEL
ALT: 25 U/L (ref 0–44)
AST: 39 U/L (ref 15–41)
Albumin: 3.9 g/dL (ref 3.5–5.0)
Alkaline Phosphatase: 69 U/L (ref 38–126)
Anion gap: 15 (ref 5–15)
BUN: 18 mg/dL (ref 8–23)
CO2: 18 mmol/L — ABNORMAL LOW (ref 22–32)
Calcium: 9.3 mg/dL (ref 8.9–10.3)
Chloride: 107 mmol/L (ref 98–111)
Creatinine, Ser: 0.88 mg/dL (ref 0.44–1.00)
GFR calc Af Amer: >60 mL/min (ref >60)
GFR calc non Af Amer: 59 mL/min — ABNORMAL LOW (ref >60)
Glucose, Bld: 183 mg/dL — ABNORMAL HIGH (ref 70–99)
Potassium: 3.8 mmol/L (ref 3.5–5.1)
Sodium: 140 mmol/L (ref 135–145)
Total Bilirubin: 0.7 mg/dL (ref 0.3–1.2)
Total Protein: 6.6 g/dL (ref 6.5–8.1)

## 2019-03-28 LAB — CBC
HCT: 40.7 % (ref 36.0–46.0)
Hemoglobin: 13.2 g/dL (ref 12.0–15.0)
MCH: 33.6 pg (ref 26.0–34.0)
MCHC: 32.4 g/dL (ref 30.0–36.0)
MCV: 103.6 fL — ABNORMAL HIGH (ref 80.0–100.0)
Platelets: 190 10*3/uL (ref 150–400)
RBC: 3.93 MIL/uL (ref 3.87–5.11)
RDW: 12.2 % (ref 11.5–15.5)
WBC: 27.6 10*3/uL — ABNORMAL HIGH (ref 4.0–10.5)
nRBC: 0 % (ref 0.0–0.2)

## 2019-03-28 LAB — POC SARS CORONAVIRUS 2 AG -  ED: SARS Coronavirus 2 Ag: NEGATIVE

## 2019-03-28 LAB — LACTIC ACID, PLASMA: Lactic Acid, Venous: 5.7 mmol/L (ref 0.5–1.9)

## 2019-03-28 LAB — URINALYSIS, ROUTINE W REFLEX MICROSCOPIC
Bilirubin Urine: NEGATIVE
Glucose, UA: NEGATIVE mg/dL
Hgb urine dipstick: NEGATIVE
Ketones, ur: NEGATIVE mg/dL
Leukocytes,Ua: NEGATIVE
Nitrite: NEGATIVE
Protein, ur: NEGATIVE mg/dL
Specific Gravity, Urine: 1.015 (ref 1.005–1.030)
pH: 5 (ref 5.0–8.0)

## 2019-03-28 LAB — CK: Total CK: 220 U/L (ref 38–234)

## 2019-03-28 LAB — I-STAT CHEM 8, ED
BUN: 21 mg/dL (ref 8–23)
Calcium, Ion: 1.16 mmol/L (ref 1.15–1.40)
Chloride: 106 mmol/L (ref 98–111)
Creatinine, Ser: 0.8 mg/dL (ref 0.44–1.00)
Glucose, Bld: 180 mg/dL — ABNORMAL HIGH (ref 70–99)
HCT: 41 % (ref 36.0–46.0)
Hemoglobin: 13.9 g/dL (ref 12.0–15.0)
Potassium: 3.7 mmol/L (ref 3.5–5.1)
Sodium: 141 mmol/L (ref 135–145)
TCO2: 23 mmol/L (ref 22–32)

## 2019-03-28 LAB — SAMPLE TO BLOOD BANK

## 2019-03-28 LAB — TROPONIN I (HIGH SENSITIVITY)
Troponin I (High Sensitivity): 10 ng/L (ref <18)
Troponin I (High Sensitivity): 18 ng/L — ABNORMAL HIGH (ref <18)

## 2019-03-28 LAB — TSH: TSH: 6.059 u[IU]/mL — ABNORMAL HIGH (ref 0.350–4.500)

## 2019-03-28 LAB — T4, FREE: Free T4: 1.79 ng/dL — ABNORMAL HIGH (ref 0.61–1.12)

## 2019-03-28 LAB — ETHANOL: Alcohol, Ethyl (B): <10 mg/dL (ref <10)

## 2019-03-28 MED ORDER — MANNITOL 25 % IV SOLN: 0.2500 g/kg | Freq: Once | INTRAVENOUS | Status: DC

## 2019-03-28 MED ORDER — HYDRALAZINE HCL 20 MG/ML IJ SOLN: 10.0000 mg | INTRAMUSCULAR | Status: DC | PRN

## 2019-03-28 MED ORDER — ONDANSETRON HCL 4 MG/2ML IJ SOLN: 4.0000 mg | Freq: Four times a day (QID) | INTRAMUSCULAR | Status: DC | PRN

## 2019-03-28 MED ORDER — NICARDIPINE HCL IN NACL 20-0.86 MG/200ML-% IV SOLN
3.0000 mg/h | INTRAVENOUS | Status: DC
  Administered 2019-03-28: 5 mg/h via INTRAVENOUS
  Administered 2019-03-28: 8 mg/h via INTRAVENOUS
  Administered 2019-03-29: 15 mg/h via INTRAVENOUS
  Administered 2019-03-29: 11 mg/h via INTRAVENOUS
  Administered 2019-03-29 (×2): 10 mg/h via INTRAVENOUS
  Administered 2019-03-29: 15 mg/h via INTRAVENOUS
  Filled 2019-03-28 (×7): qty 200

## 2019-03-28 MED ORDER — MORPHINE SULFATE (PF) 2 MG/ML IV SOLN
1.0000 mg | INTRAVENOUS | Status: DC | PRN
  Administered 2019-03-29 (×3): 1 mg via INTRAVENOUS
  Filled 2019-03-28 (×3): qty 1

## 2019-03-28 MED ORDER — LEVETIRACETAM IN NACL 1500 MG/100ML IV SOLN
1500.0000 mg | Freq: Once | INTRAVENOUS | Status: AC
  Administered 2019-03-28: 1500 mg via INTRAVENOUS
  Filled 2019-03-28: qty 100

## 2019-03-28 MED ORDER — CHLORHEXIDINE GLUCONATE CLOTH 2 % EX PADS: 6.0000 | MEDICATED_PAD | Freq: Every day | CUTANEOUS | Status: DC

## 2019-03-28 MED ORDER — ONDANSETRON 4 MG PO TBDP: 4.0000 mg | ORAL_TABLET | Freq: Four times a day (QID) | ORAL | Status: DC | PRN

## 2019-03-28 MED ORDER — ONDANSETRON HCL 4 MG/2ML IJ SOLN
4.0000 mg | Freq: Once | INTRAMUSCULAR | Status: AC
  Administered 2019-03-28: 4 mg via INTRAVENOUS
  Filled 2019-03-28: qty 2

## 2019-03-28 MED ORDER — IOHEXOL 350 MG/ML SOLN
75.0000 mL | Freq: Once | INTRAVENOUS | Status: AC | PRN
  Administered 2019-03-28: 75 mL via INTRAVENOUS

## 2019-03-28 MED ORDER — METOPROLOL TARTRATE 5 MG/5ML IV SOLN: 5.0000 mg | Freq: Four times a day (QID) | INTRAVENOUS | Status: DC | PRN

## 2019-03-28 MED ORDER — LACTATED RINGERS IV BOLUS
1000.0000 mL | Freq: Once | INTRAVENOUS | Status: AC
  Administered 2019-03-28: 1000 mL via INTRAVENOUS

## 2019-03-28 MED ORDER — SODIUM CHLORIDE 0.9 % IV SOLN
1.0000 g | Freq: Once | INTRAVENOUS | Status: AC
  Administered 2019-03-28: 1 g via INTRAVENOUS
  Filled 2019-03-28: qty 10

## 2019-03-28 MED ORDER — DEXTROSE-NACL 5-0.9 % IV SOLN
INTRAVENOUS | Status: DC
  Administered 2019-03-28 – 2019-03-29 (×2): via INTRAVENOUS

## 2019-03-28 MED ORDER — VANCOMYCIN HCL 10 G IV SOLR
1750.0000 mg | Freq: Once | INTRAVENOUS | Status: AC
  Administered 2019-03-28: 1750 mg via INTRAVENOUS
  Filled 2019-03-28: qty 1750

## 2019-03-28 MED ORDER — MORPHINE SULFATE (PF) 4 MG/ML IV SOLN
4.0000 mg | Freq: Once | INTRAVENOUS | Status: AC
  Administered 2019-03-28: 4 mg via INTRAVENOUS
  Filled 2019-03-28: qty 1

## 2019-03-28 NOTE — ED Triage Notes (Signed)
Pt from home with ems as LEVEL 2 trauma, pt found on the ground in her garage, the last time anyone had talked to the pt was 10am this morning. Pts son tried calling around 3pm and got no answer. Neighbor found pt in garage floor in pool of blood. Bilateral eye bruising. c collar in place, emesis x1 just PTA, ems gave 4mg  zofran IM. Pt arrives alert

## 2019-03-28 NOTE — ED Notes (Signed)
Returned from [place] with this RN

## 2019-03-28 NOTE — Progress Notes (Signed)
Orthopedic Tech Progress Note Patient Details:  [name] Aug 27, 1929 [id number]  Patient ID: [name], female   DOB: [birth year], 85 y.o.   MRN: [id number]   [name] 2 Trauma 04/06/2019, 6:36 PM

## 2019-03-28 NOTE — ED Notes (Signed)
Main lab to add on Ck abd TSH

## 2019-03-28 NOTE — ED Notes (Signed)
bair hugger placed on pt. 

## 2019-03-28 NOTE — ED Provider Notes (Signed)
[facility] Provider Note   CSN: [id number] Arrival date & time: 04/11/2019  1749     History   Chief Complaint Chief Complaint  Patient presents with  . Fall  . Trauma    HPI [name] is a 85 y.o. female.  Fall found down in garage, cold cement, possibly 6 hours lying there Family last spoke to her around 10 am EMS reports she was lying in 1/2 L of IVF on the floor Temp on scene 70F orally BP and HR stable per EMS, she is hard of hearing, but GCS > 8 per EMS  No reported blood thinner use Patient otherwise cannot provide much history     HPI  History reviewed. No pertinent past medical history.  Patient Active Problem List   Diagnosis Date Noted  . TBI (traumatic brain injury) ([place]) 03/28/2019    History reviewed. No pertinent surgical history.   OB History   No obstetric history on file.      Home Medications    Prior to Admission medications   Not on File    Family History No family history on file.  Social History Social History   Tobacco Use  . Smoking status: Not on file  Substance Use Topics  . Alcohol use: Not on file  . Drug use: Not on file     Allergies   Patient has no allergy information on record.   Review of Systems Review of Systems  Unable to perform ROS: Acuity of condition     Physical Exam Updated Vital Signs BP (!) 129/50   Pulse 86   Temp 98 F (36.7 C) (Axillary)   Resp (!) 25   Ht 5\' 4"  (1.626 m) Comment: per pt  Wt 68.5 kg   SpO2 95%   BMI 25.92 kg/m   Physical Exam Vitals signs and nursing note reviewed.  Constitutional:      Appearance: She is well-developed.  HENT:     Head: Normocephalic.     Comments: Diffuse contusions and swelling of the face Periorbital ecchymoses Dried blood in bilateral nares +hemotympanum No evidence large scalp laceration Neck:     Comments: C spine collar in place Cardiovascular:     Rate and Rhythm: Normal rate and  regular rhythm.     Pulses: Normal pulses.  Pulmonary:     Effort: Pulmonary effort is normal. No respiratory distress.     Breath sounds: Normal breath sounds.  Abdominal:     Palpations: Abdomen is soft.     Tenderness: There is no abdominal tenderness.  Skin:    General: Skin is warm and dry.  Neurological:     Mental Status: She is alert.     GCS: GCS eye subscore is 3. GCS verbal subscore is 4. GCS motor subscore is 6.      ED Treatments / Results  Labs (all labs ordered are listed, but only abnormal results are displayed) Labs Reviewed  COMPREHENSIVE METABOLIC PANEL - Abnormal; Notable for the following components:      Result Value   CO2 18 (*)    Glucose, Bld 183 (*)    GFR calc non Af Amer 59 (*)    All other components within normal limits  CBC - Abnormal; Notable for the following components:   WBC 27.6 (*)    MCV 103.6 (*)    All other components within normal limits  LACTIC ACID, PLASMA - Abnormal; Notable for the following components:  Lactic Acid, Venous 5.7 (*)    All other components within normal limits  TSH - Abnormal; Notable for the following components:   TSH 6.059 (*)    All other components within normal limits  CBC WITH DIFFERENTIAL/PLATELET - Abnormal; Notable for the following components:   WBC 27.6 (*)    MCV 103.5 (*)    Neutro Abs 22.4 (*)    Monocytes Absolute 1.4 (*)    All other components within normal limits  T4, FREE - Abnormal; Notable for the following components:   Free T4 1.79 (*)    All other components within normal limits  I-STAT CHEM 8, ED - Abnormal; Notable for the following components:   Glucose, Bld 180 (*)    All other components within normal limits  TROPONIN I (HIGH SENSITIVITY) - Abnormal; Notable for the following components:   Troponin I (High Sensitivity) 18 (*)    All other components within normal limits  URINE CULTURE  CDS SEROLOGY  ETHANOL  URINALYSIS, ROUTINE W REFLEX MICROSCOPIC  PROTIME-INR  CK   POC SARS CORONAVIRUS 2 AG -  ED  SAMPLE TO BLOOD BANK  TROPONIN I (HIGH SENSITIVITY)    EKG None  Radiology Ct Angio Head W Or Wo Contrast  Result Date: 04/08/2019 CLINICAL DATA:  Fall with posttraumatic headache EXAM: CT ANGIOGRAPHY HEAD AND NECK TECHNIQUE: Multidetector CT imaging of the head and neck was performed using the standard protocol during bolus administration of intravenous contrast. Multiplanar CT image reconstructions and MIPs were obtained to evaluate the vascular anatomy. Carotid stenosis measurements (when applicable) are obtained utilizing NASCET criteria, using the distal internal carotid diameter as the denominator. CONTRAST:  15mL OMNIPAQUE IOHEXOL 350 MG/ML SOLN COMPARISON:  None. Head CT same day FINDINGS: CTA NECK FINDINGS SKELETON: Multiple facial fractures are detailed on the earlier maxillofacial CT. OTHER NECK: Normal pharynx, larynx and major salivary glands. No cervical lymphadenopathy. Unremarkable thyroid gland. UPPER CHEST: No pneumothorax or pleural effusion. No nodules or masses. AORTIC ARCH: There is moderate calcific atherosclerosis of the aortic arch. There is no aneurysm, dissection or hemodynamically significant stenosis of the visualized portion of the aorta. Conventional 3 vessel aortic branching pattern. The visualized proximal subclavian arteries are widely patent. RIGHT CAROTID SYSTEM: Normal without aneurysm, dissection or stenosis. LEFT CAROTID SYSTEM: Normal without aneurysm, dissection or stenosis. VERTEBRAL ARTERIES: Right dominant configuration. Both origins are clearly patent. There is no dissection, occlusion or flow-limiting stenosis to the skull base (V1-V3 segments). CTA HEAD FINDINGS POSTERIOR CIRCULATION: --Vertebral arteries: Normal V4 segments. --Posterior inferior cerebellar arteries (PICA): Patent origins from the vertebral arteries. --Anterior inferior cerebellar arteries (AICA): Patent origins from the basilar artery. --Basilar artery:  Normal. --Superior cerebellar arteries: Normal. --Posterior cerebral arteries: Normal. The right PCA is predominantly supplied by the posterior communicating artery. ANTERIOR CIRCULATION: --Intracranial internal carotid arteries: No dissection or occlusion. There is a short segment of dehiscence of the anteromedial right carotid canal at the level of the sphenoid sinus (series 7, image 127). There is mild calcific atherosclerosis without high-grade stenosis. --Anterior cerebral arteries (ACA): Normal. Both A1 segments are present. Patent anterior communicating artery (a-comm). --Middle cerebral arteries (MCA): Normal. VENOUS SINUSES: As permitted by contrast timing, patent. ANATOMIC VARIANTS: Fetal origin of the right posterior cerebral artery. Review of the MIP images confirms the above findings. IMPRESSION: 1. No intracranial arterial occlusion or high-grade stenosis. 2. No acute blunt cerebrovascular injury at the skull base. 3. Redemonstration of facial fractures and intracranial hemorrhage as described on earlier head  CT. 4. Aortic Atherosclerosis (ICD10-I70.0). Electronically Signed   By: Ulyses Jarred M.D.   On: 04/02/2019 21:50   Ct Head Wo Contrast  Result Date: 04/09/2019 CLINICAL DATA:  Found on ground in garage in pool of blood, last known normal 10 a.m. EXAM: CT HEAD WITHOUT CONTRAST CT MAXILLOFACIAL WITHOUT CONTRAST CT CERVICAL SPINE WITHOUT CONTRAST TECHNIQUE: Multidetector CT imaging of the head, cervical spine, and maxillofacial structures were performed using the standard protocol without intravenous contrast. Multiplanar CT image reconstructions of the cervical spine and maxillofacial structures were also generated. COMPARISON:  CT head 02/08/2019, MR 02/09/2019 FINDINGS: CT HEAD FINDINGS Brain: Parenchymal hemorrhage seen in the mesial right temporal lobe. Hemorrhagic parenchymal contusive changes are noted along the right frontal convexity and right temporal lobe. Additional multifocal sites  of subarachnoid and subdural hemorrhage are noted along the falx and tentorium as well as layering within both sylvian fissures with some intraventricular hemorrhage noted in the left lateral ventricle. Additional hemorrhage seen in the quadrigeminal cistern. Scattered pneumocephalus is seen throughout both hemispheres and within the posterior fossa. No midline shift is seen. Findings on a background of chronic microvascular angiopathy and parenchymal volume loss. Vascular: Atherosclerotic calcification of the carotid siphons and intradural vertebral arteries. No hyperdense vessel. Several foci of gas are seen within the cavernous sinuses. Skull: Extensive left frontotemporal scalp swelling and contusion with large crescentic hematoma measuring up to 9 mm in maximal thickness. Additional swelling extends into the frontal scalp and supraorbital tissues. Other: Patient motion artifact may limit detection of subtle, nondisplaced skull base and calvarial fractures. CT MAXILLOFACIAL FINDINGS Osseous: There is a comminuted fracture of the left pterygoid plates. Complex facial bone fractures include a left LeFort type 1 pattern with fractures through the medial and lateral walls maxillary sinus extending into the alveolar ridge likely involving the nasal septum. There is additional left LeFort type 3 injury with involving fractures through the medial and lateral walls of the left orbit, left zygomatic arch and left nasofrontal suture. Additional fractures are seen through the posterior nasal septum as well as the through the lateral, posterior and superior walls of the bilateral sphenoid sinuses and sphenoid sinus septum. Fractures extend in close proximity to both the optic and carotid canals likely providing the site of ingress for the intracranial gas detailed above. Additional fracture through the right lateral orbit into the zygomatic process of the right maxilla. Patient is largely edentulous. The mandible appears to  be intact temporomandibular joints are normally aligned. Orbits: There is extraconal retroseptal gas and hemorrhage seen in the left orbit. Some thickening of the lateral rectus is noted as it comes in close proximity to the lateral orbital fracture. Small amount of hemorrhage is seen in the lateral floor of the right orbit as well without retro septal gas. The globes appear normal and symmetric. Symmetric appearance of the optic nerve sheath complexes. Normal caliber of the superior ophthalmic veins. Sinuses: There is extensive hyperdense hemosinus with pneumatized secretions in air-fluid levels throughout the left maxillary and sphenoid sinuses as well as near complete opacification of the ethmoids, left greater than right. Mastoid air cells remain well aerated. Middle ear cavities are clear. There is nonspecific soft tissue occlusion of the left external auditory canal. Ossicular chains appear normally configured. Soft tissues: There is extensive left frontal and periorbital soft tissue swelling extending across the eyelids to the medial nasal bridge and into the may large soft tissues inferiorly. Posteriorly this extends in continuity with the large left frontal scalp  contusion and probable laceration with few punctate foci of gas. There is milder right periorbital soft tissue swelling and hematoma. Focal left pre mandibular soft tissue swelling and stranding is noted as well. CT CERVICAL SPINE FINDINGS Alignment: Reversal of the normal cervical lordosis likely on a degenerative basis. No traumatic listhesis or abnormal facet widening is seen. Craniocervical atlantoaxial articulation is maintained. Skull base and vertebrae: Motion artifact may limit detection of subtle, nondisplaced fractures. No definite acute fracture or suspicious osseous lesion of the cervical spine is seen. Soft tissues and spinal canal: No pre or paravertebral fluid or swelling. No visible canal hematoma. Disc levels: Multilevel diffuse  intervertebral disc height loss is noted of the cervical spine most pronounced from C5-C7. No severe canal stenosis or foraminal narrowing. Upper chest: Emphysematous changes noted in the lung apices. Other: Cervical carotid atherosclerosis. Diminutive appearance of the thyroid. IMPRESSION: 1. Hemorrhagic parenchymal contusive changes along the right frontal convexity and right temporal lobe. Additional multifocal sites of subarachnoid and subdural hemorrhage are seen along the falx, tentorium, and within the sylvian fissures. Hemorrhage layering within the left lateral ventricle. Additional hemorrhage seen in the quadrigeminal cistern. Convincing features of active or pending herniation at this time. 2. Scattered pneumocephalus is seen throughout both hemispheres and within the posterior fossa. 3. Complex facial bone fractures as detailed above, including a left LeFort type 1 and type 3 fracture patterns. A right lateral orbital fracture extending into the cycle medic process of the maxilla. 4. Additional fractures of the posterior nasal septum and comminuted fractures of the sphenoid sinuses fractures extend in close proximity to both the optic and carotid canals likely providing the site of ingress for the intracranial gas detailed above. Consider further evaluation with CT angiography of the head and neck to assess for potential vascular injury. 5. Extensive associated hemosinus. 6. Extensive left frontal scalp, left periorbital and malar soft tissue swelling and contusion with large crescentic hematoma measuring up to 9 mm in maximal thickness. Left pre mandibular soft tissue swelling is noted as well. 7. Second separate site of contusion and hematoma in the right periorbital soft tissues as well. 8. No evidence of acute traumatic injury to the cervical spine though evaluation is somewhat limited by motion artifact. 9. Multilevel degenerative changes of the cervical spine are detailed above. 10. Nonspecific  soft tissue occlusion of the left external auditory canal. Correlation with direct visualization is recommended. These results were called by telephone at the time of interpretation on 03/27/2019 at 7:57 pm to provider Dr. Langston Masker, who verbally acknowledged these results. Electronically Signed   By: Lovena Le M.D.   On: 03/25/2019 19:59   Ct Angio Neck W And/or Wo Contrast  Result Date: 04/07/2019 CLINICAL DATA:  Fall with posttraumatic headache EXAM: CT ANGIOGRAPHY HEAD AND NECK TECHNIQUE: Multidetector CT imaging of the head and neck was performed using the standard protocol during bolus administration of intravenous contrast. Multiplanar CT image reconstructions and MIPs were obtained to evaluate the vascular anatomy. Carotid stenosis measurements (when applicable) are obtained utilizing NASCET criteria, using the distal internal carotid diameter as the denominator. CONTRAST:  59mL OMNIPAQUE IOHEXOL 350 MG/ML SOLN COMPARISON:  None. Head CT same day FINDINGS: CTA NECK FINDINGS SKELETON: Multiple facial fractures are detailed on the earlier maxillofacial CT. OTHER NECK: Normal pharynx, larynx and major salivary glands. No cervical lymphadenopathy. Unremarkable thyroid gland. UPPER CHEST: No pneumothorax or pleural effusion. No nodules or masses. AORTIC ARCH: There is moderate calcific atherosclerosis of the aortic arch. There  is no aneurysm, dissection or hemodynamically significant stenosis of the visualized portion of the aorta. Conventional 3 vessel aortic branching pattern. The visualized proximal subclavian arteries are widely patent. RIGHT CAROTID SYSTEM: Normal without aneurysm, dissection or stenosis. LEFT CAROTID SYSTEM: Normal without aneurysm, dissection or stenosis. VERTEBRAL ARTERIES: Right dominant configuration. Both origins are clearly patent. There is no dissection, occlusion or flow-limiting stenosis to the skull base (V1-V3 segments). CTA HEAD FINDINGS POSTERIOR CIRCULATION: --Vertebral  arteries: Normal V4 segments. --Posterior inferior cerebellar arteries (PICA): Patent origins from the vertebral arteries. --Anterior inferior cerebellar arteries (AICA): Patent origins from the basilar artery. --Basilar artery: Normal. --Superior cerebellar arteries: Normal. --Posterior cerebral arteries: Normal. The right PCA is predominantly supplied by the posterior communicating artery. ANTERIOR CIRCULATION: --Intracranial internal carotid arteries: No dissection or occlusion. There is a short segment of dehiscence of the anteromedial right carotid canal at the level of the sphenoid sinus (series 7, image 127). There is mild calcific atherosclerosis without high-grade stenosis. --Anterior cerebral arteries (ACA): Normal. Both A1 segments are present. Patent anterior communicating artery (a-comm). --Middle cerebral arteries (MCA): Normal. VENOUS SINUSES: As permitted by contrast timing, patent. ANATOMIC VARIANTS: Fetal origin of the right posterior cerebral artery. Review of the MIP images confirms the above findings. IMPRESSION: 1. No intracranial arterial occlusion or high-grade stenosis. 2. No acute blunt cerebrovascular injury at the skull base. 3. Redemonstration of facial fractures and intracranial hemorrhage as described on earlier head CT. 4. Aortic Atherosclerosis (ICD10-I70.0). Electronically Signed   By: Ulyses Jarred M.D.   On: 04/17/2019 21:50   Ct Cervical Spine Wo Contrast  Result Date: 03/22/2019 CLINICAL DATA:  Found on ground in garage in pool of blood, last known normal 10 a.m. EXAM: CT HEAD WITHOUT CONTRAST CT MAXILLOFACIAL WITHOUT CONTRAST CT CERVICAL SPINE WITHOUT CONTRAST TECHNIQUE: Multidetector CT imaging of the head, cervical spine, and maxillofacial structures were performed using the standard protocol without intravenous contrast. Multiplanar CT image reconstructions of the cervical spine and maxillofacial structures were also generated. COMPARISON:  CT head 02/08/2019, MR  02/09/2019 FINDINGS: CT HEAD FINDINGS Brain: Parenchymal hemorrhage seen in the mesial right temporal lobe. Hemorrhagic parenchymal contusive changes are noted along the right frontal convexity and right temporal lobe. Additional multifocal sites of subarachnoid and subdural hemorrhage are noted along the falx and tentorium as well as layering within both sylvian fissures with some intraventricular hemorrhage noted in the left lateral ventricle. Additional hemorrhage seen in the quadrigeminal cistern. Scattered pneumocephalus is seen throughout both hemispheres and within the posterior fossa. No midline shift is seen. Findings on a background of chronic microvascular angiopathy and parenchymal volume loss. Vascular: Atherosclerotic calcification of the carotid siphons and intradural vertebral arteries. No hyperdense vessel. Several foci of gas are seen within the cavernous sinuses. Skull: Extensive left frontotemporal scalp swelling and contusion with large crescentic hematoma measuring up to 9 mm in maximal thickness. Additional swelling extends into the frontal scalp and supraorbital tissues. Other: Patient motion artifact may limit detection of subtle, nondisplaced skull base and calvarial fractures. CT MAXILLOFACIAL FINDINGS Osseous: There is a comminuted fracture of the left pterygoid plates. Complex facial bone fractures include a left LeFort type 1 pattern with fractures through the medial and lateral walls maxillary sinus extending into the alveolar ridge likely involving the nasal septum. There is additional left LeFort type 3 injury with involving fractures through the medial and lateral walls of the left orbit, left zygomatic arch and left nasofrontal suture. Additional fractures are seen through the posterior nasal septum  as well as the through the lateral, posterior and superior walls of the bilateral sphenoid sinuses and sphenoid sinus septum. Fractures extend in close proximity to both the optic and  carotid canals likely providing the site of ingress for the intracranial gas detailed above. Additional fracture through the right lateral orbit into the zygomatic process of the right maxilla. Patient is largely edentulous. The mandible appears to be intact temporomandibular joints are normally aligned. Orbits: There is extraconal retroseptal gas and hemorrhage seen in the left orbit. Some thickening of the lateral rectus is noted as it comes in close proximity to the lateral orbital fracture. Small amount of hemorrhage is seen in the lateral floor of the right orbit as well without retro septal gas. The globes appear normal and symmetric. Symmetric appearance of the optic nerve sheath complexes. Normal caliber of the superior ophthalmic veins. Sinuses: There is extensive hyperdense hemosinus with pneumatized secretions in air-fluid levels throughout the left maxillary and sphenoid sinuses as well as near complete opacification of the ethmoids, left greater than right. Mastoid air cells remain well aerated. Middle ear cavities are clear. There is nonspecific soft tissue occlusion of the left external auditory canal. Ossicular chains appear normally configured. Soft tissues: There is extensive left frontal and periorbital soft tissue swelling extending across the eyelids to the medial nasal bridge and into the may large soft tissues inferiorly. Posteriorly this extends in continuity with the large left frontal scalp contusion and probable laceration with few punctate foci of gas. There is milder right periorbital soft tissue swelling and hematoma. Focal left pre mandibular soft tissue swelling and stranding is noted as well. CT CERVICAL SPINE FINDINGS Alignment: Reversal of the normal cervical lordosis likely on a degenerative basis. No traumatic listhesis or abnormal facet widening is seen. Craniocervical atlantoaxial articulation is maintained. Skull base and vertebrae: Motion artifact may limit detection of  subtle, nondisplaced fractures. No definite acute fracture or suspicious osseous lesion of the cervical spine is seen. Soft tissues and spinal canal: No pre or paravertebral fluid or swelling. No visible canal hematoma. Disc levels: Multilevel diffuse intervertebral disc height loss is noted of the cervical spine most pronounced from C5-C7. No severe canal stenosis or foraminal narrowing. Upper chest: Emphysematous changes noted in the lung apices. Other: Cervical carotid atherosclerosis. Diminutive appearance of the thyroid. IMPRESSION: 1. Hemorrhagic parenchymal contusive changes along the right frontal convexity and right temporal lobe. Additional multifocal sites of subarachnoid and subdural hemorrhage are seen along the falx, tentorium, and within the sylvian fissures. Hemorrhage layering within the left lateral ventricle. Additional hemorrhage seen in the quadrigeminal cistern. Convincing features of active or pending herniation at this time. 2. Scattered pneumocephalus is seen throughout both hemispheres and within the posterior fossa. 3. Complex facial bone fractures as detailed above, including a left LeFort type 1 and type 3 fracture patterns. A right lateral orbital fracture extending into the cycle medic process of the maxilla. 4. Additional fractures of the posterior nasal septum and comminuted fractures of the sphenoid sinuses fractures extend in close proximity to both the optic and carotid canals likely providing the site of ingress for the intracranial gas detailed above. Consider further evaluation with CT angiography of the head and neck to assess for potential vascular injury. 5. Extensive associated hemosinus. 6. Extensive left frontal scalp, left periorbital and malar soft tissue swelling and contusion with large crescentic hematoma measuring up to 9 mm in maximal thickness. Left pre mandibular soft tissue swelling is noted as well. 7. Second  separate site of contusion and hematoma in the right  periorbital soft tissues as well. 8. No evidence of acute traumatic injury to the cervical spine though evaluation is somewhat limited by motion artifact. 9. Multilevel degenerative changes of the cervical spine are detailed above. 10. Nonspecific soft tissue occlusion of the left external auditory canal. Correlation with direct visualization is recommended. These results were called by telephone at the time of interpretation on 03/24/2019 at 7:57 pm to provider Dr. Langston Masker, who verbally acknowledged these results. Electronically Signed   By: Lovena Le M.D.   On: 04/04/2019 19:59   Dg Pelvis Portable  Result Date: 04/02/2019 CLINICAL DATA:  Fall, level 2 trauma EXAM: PORTABLE PELVIS 1-2 VIEWS COMPARISON:  None. FINDINGS: The osseous structures appear diffusely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

## 2019-03-28 NOTE — ED Notes (Signed)
Suzi Roots (daughter) [phone]

## 2019-03-28 NOTE — ED Notes (Signed)
Aspen collar applied for comfort

## 2019-03-28 NOTE — ED Notes (Signed)
BP goal for Cardene drip is a SBP between 100-140

## 2019-03-28 NOTE — H&P (Signed)
[name] is an 85 y.o. female.   Chief Complaint: Fall in garage HPI: Patient brought in as a level 2 trauma after being found down in her garage.  She had fallen but this was unwitnessed.  She was found down by her daughter.  She had significant facial bruising.  She was evaluated by the EDP and found to have pneumocephalus, small mount of subarachnoid blood, a LeFort I facial fracture.  EDP consulted neurosurgery and ENT.  Trauma was asked to admit patient.  Daughter is unaware of her medical problems.  By reviewing her chart it looks like she has a history of hypertension, mild dementia and cardiovascular disease.  She is hard of hearing and has difficulty seeing.  She is awake and can answer questions.  Daughter at bedside.  Most of history provided by daughter.  History reviewed. No pertinent past medical history.  History reviewed. No pertinent surgical history.  No family history on file. Social History:  has no history on file for tobacco, alcohol, and drug.  Allergies: Not on File  (Not in a hospital admission)   Results for orders placed or performed during the hospital encounter of 03/22/2019 (from the past 48 hour(s))  Ethanol     Status: None   Collection Time: 04/15/2019  5:57 PM  Result Value Ref Range   Alcohol, Ethyl (B) <10 <10 mg/dL    Comment: (NOTE) Lowest detectable limit for serum alcohol is 10 mg/dL. For medical purposes only. Performed at [facility], [place] [address] [place], [place] [address]   Lactic acid, plasma     Status: Abnormal   Collection Time: 04/12/2019  5:57 PM  Result Value Ref Range   Lactic Acid, Venous 5.7 (HH) 0.5 - 1.9 mmol/L    Comment: CRITICAL RESULT CALLED TO, READ BACK BY AND VERIFIED WITH: C.MARSHALL,RN [id number] I.MANNING Performed at [facility], [place] [address] [place], [place] [address]   Sample to Blood Bank     Status: None   Collection Time: 03/21/2019  5:57 PM  Result Value Ref Range   Blood Bank Specimen  SAMPLE AVAILABLE FOR TESTING    Sample Expiration      03/30/2019,2359 Performed at [facility], [place] [address], [place], [place] [address]   CDS serology     Status: None   Collection Time: 04/05/2019  5:58 PM  Result Value Ref Range   CDS serology specimen      SPECIMEN WILL BE HELD FOR 14 DAYS IF TESTING IS REQUIRED    Comment: Performed at [facility], [place] [address] [place], [place] [address]  Comprehensive metabolic panel     Status: Abnormal   Collection Time: 04/15/2019  5:58 PM  Result Value Ref Range   Sodium 140 135 - 145 mmol/L   Potassium 3.8 3.5 - 5.1 mmol/L   Chloride 107 98 - 111 mmol/L   CO2 18 (L) 22 - 32 mmol/L   Glucose, Bld 183 (H) 70 - 99 mg/dL   BUN 18 8 - 23 mg/dL   Creatinine, Ser 0.88 0.44 - 1.00 mg/dL   Calcium 9.3 8.9 - 10.3 mg/dL   Total Protein 6.6 6.5 - 8.1 g/dL   Albumin 3.9 3.5 - 5.0 g/dL   AST 39 15 - 41 U/L   ALT 25 0 - 44 U/L   Alkaline Phosphatase 69 38 - 126 U/L   Total Bilirubin 0.7 0.3 - 1.2 mg/dL   GFR calc  non Af Amer 59 (L) >60 mL/min   GFR calc Af Amer >60 >60 mL/min   Anion gap 15 5 - 15    Comment: Performed at [facility] [address], [place], [place] [address]  CBC     Status: Abnormal   Collection Time: 04/10/2019  5:58 PM  Result Value Ref Range   WBC 27.6 (H) 4.0 - 10.5 K/uL   RBC 3.93 3.87 - 5.11 MIL/uL   Hemoglobin 13.2 12.0 - 15.0 g/dL   HCT 40.7 36.0 - 46.0 %   MCV 103.6 (H) 80.0 - 100.0 fL   MCH 33.6 26.0 - 34.0 pg   MCHC 32.4 30.0 - 36.0 g/dL   RDW 12.2 11.5 - 15.5 %   Platelets 190 150 - 400 K/uL   nRBC 0.0 0.0 - 0.2 %    Comment: Performed at [facility], [place] [address] [place], [place] [address]  Protime-INR     Status: None   Collection Time: 03/24/2019  5:58 PM  Result Value Ref Range   Prothrombin Time 14.6 11.4 - 15.2 seconds   INR 1.2 0.8 - 1.2    Comment: (NOTE) INR goal varies based on device and disease states. Performed at [facility], [place] [address],  [place], [place] [address]   Troponin I (High Sensitivity)     Status: None   Collection Time: 04/02/2019  5:58 PM  Result Value Ref Range   Troponin I (High Sensitivity) 10 <18 ng/L    Comment: (NOTE) Elevated high sensitivity troponin I (hsTnI) values and significant  changes across serial measurements may suggest ACS but many other  chronic and acute conditions are known to elevate hsTnI results.  Refer to the "Links" section for chest pain algorithms and additional  guidance. Performed at [facility], [place] [address], [place], [place] [address]   CK     Status: None   Collection Time: 03/26/2019  5:58 PM  Result Value Ref Range   Total CK 220 38 - 234 U/L    Comment: Performed at [facility], [place] [address], [place], [place] [address]  CBC with Differential/Platelet     Status: Abnormal   Collection Time: 03/26/2019  5:58 PM  Result Value Ref Range   WBC 27.6 (H) 4.0 - 10.5 K/uL   RBC 4.02 3.87 - 5.11 MIL/uL   Hemoglobin 13.1 12.0 - 15.0 g/dL   HCT 41.6 36.0 - 46.0 %   MCV 103.5 (H) 80.0 - 100.0 fL   MCH 32.6 26.0 - 34.0 pg   MCHC 31.5 30.0 - 36.0 g/dL   RDW 12.3 11.5 - 15.5 %   Platelets 200 150 - 400 K/uL   nRBC 0.0 0.0 - 0.2 %   Neutrophils Relative % 81 %   Neutro Abs 22.4 (H) 1.7 - 7.7 K/uL   Lymphocytes Relative 14 %   Lymphs Abs 3.9 0.7 - 4.0 K/uL   Monocytes Relative 5 %   Monocytes Absolute 1.4 (H) 0.1 - 1.0 K/uL   Eosinophils Relative 0 %   Eosinophils Absolute 0.0 0.0 - 0.5 K/uL   Basophils Relative 0 %   Basophils Absolute 0.0 0.0 - 0.1 K/uL   nRBC 0 0 /100 WBC   Abs Immature Granulocytes 0.00 0.00 - 0.07 K/uL    Comment: Performed at [facility], [place] [address] [place], [place] [address]  I-stat chem 8, ED     Status: Abnormal  Collection Time: 03/20/2019  6:06 PM  Result Value Ref Range   Sodium 141 135 - 145 mmol/L   Potassium 3.7 3.5 - 5.1 mmol/L   Chloride 106 98 - 111 mmol/L   BUN 21 8 - 23 mg/dL   Creatinine, Ser 0.80 0.44 - 1.00  mg/dL   Glucose, Bld 180 (H) 70 - 99 mg/dL   Calcium, Ion 1.16 1.15 - 1.40 mmol/L   TCO2 23 22 - 32 mmol/L   Hemoglobin 13.9 12.0 - 15.0 g/dL   HCT 41.0 36.0 - 46.0 %  TSH     Status: Abnormal   Collection Time: 03/27/2019  6:48 PM  Result Value Ref Range   TSH 6.059 (H) 0.350 - 4.500 uIU/mL    Comment: Performed by a 3rd Generation assay with a functional sensitivity of <=0.01 uIU/mL. Performed at [facility], [place] [address], [place], [place] [address]   POC SARS Coronavirus 2 Ag-ED - Nasal Swab (BD Veritor Kit)     Status: None   Collection Time: 03/27/2019  7:19 PM  Result Value Ref Range   SARS Coronavirus 2 Ag NEGATIVE NEGATIVE    Comment: (NOTE) SARS-CoV-2 antigen NOT DETECTED.  Negative results are presumptive.  Negative results do not preclude SARS-CoV-2 infection and should not be used as the sole basis for treatment or other patient management decisions, including infection  control decisions, particularly in the presence of clinical signs and  symptoms consistent with COVID-19, or in those who have been in contact with the virus.  Negative results must be combined with clinical observations, patient history, and epidemiological information. The expected result is Negative. Fact Sheet for Patients: PodPark.tn Fact Sheet for Healthcare Providers: GiftContent.is This test is not yet approved or cleared by the [place] FDA and  has been authorized for detection and/or diagnosis of SARS-CoV-2 by FDA under an Emergency Use Authorization (EUA).  This EUA will remain in effect (meaning this test can be used) for the duration of  the COVID-19 de claration under Section 564(b)(1) of the Act, 21 U.S.C. section 360bbb-3(b)(1), unless the authorization is terminated or revoked sooner.   Urinalysis, Routine w reflex microscopic     Status: None   Collection Time: 04/13/2019  7:35 PM  Result Value Ref Range   Color,  Urine YELLOW YELLOW   APPearance CLEAR CLEAR   Specific Gravity, Urine 1.015 1.005 - 1.030   pH 5.0 5.0 - 8.0   Glucose, UA NEGATIVE NEGATIVE mg/dL   Hgb urine dipstick NEGATIVE NEGATIVE   Bilirubin Urine NEGATIVE NEGATIVE   Ketones, ur NEGATIVE NEGATIVE mg/dL   Protein, ur NEGATIVE NEGATIVE mg/dL   Nitrite NEGATIVE NEGATIVE   Leukocytes,Ua NEGATIVE NEGATIVE    Comment: Performed at [facility] [address], [place], [place] [address]   Dg Pelvis Portable  Result Date: 04/09/2019 CLINICAL DATA:  Fall, level 2 trauma EXAM: PORTABLE PELVIS 1-2 VIEWS COMPARISON:  None. FINDINGS: The osseous structures appear diffusely demineralized which may limit detection of small or nondisplaced fractures. Bones of the pelvis appear intact and congruent. Femoral heads remain normally located. Proximal femora are intact. Extensive degenerative changes noted in the lower lumbar spine and lumbosacral junction. Moderate degenerative changes in the hips. Normal nonobstructive bowel gas pattern. Atherosclerotic calcifications of vascular stenting noted in the low abdomen. Remaining soft tissues are unremarkable. IMPRESSION: 1. No acute fracture or dislocation identified. 2. Diffuse osseous demineralization which may limit detection of small or nondisplaced fractures. 3. Degenerative changes in  the lower lumbar spine and lumbosacral junction as well as both hips. Electronically Signed   By: Lovena Le M.D.   On: 04/18/2019 18:33   Dg Chest Port 1 View  Result Date: 03/20/2019 CLINICAL DATA:  Fall, level 2 trauma EXAM: PORTABLE CHEST 1 VIEW COMPARISON:  Radiograph 01/19/2019, CTA chest 04/09/2018 FINDINGS: No consolidation, features of edema, pneumothorax, or effusion. The aorta is calcified. The remaining cardiomediastinal contours are unremarkable. Abundant pericardial fat result in increased attenuation in the left lung base corresponding well to CT findings from 2019. No acute osseous or soft tissue  abnormality. Dextrocurvature of the upper thoracic spine is noted. Degenerative changes are present in the imaged spine and shoulders. Remote left lateral third rib fracture. IMPRESSION: 1. No acute cardiopulmonary or traumatic finding in the chest. 2. Abundant pericardial fat resulting in increased attenuation in the left lung base, corresponding to CT findings from 2019. 3.  Aortic Atherosclerosis (ICD10-I70.0). Electronically Signed   By: Lovena Le M.D.   On: 04/17/2019 18:24    Review of Systems  Unable to perform ROS: Age    Blood pressure (!) 132/58, pulse 78, temperature (!) 94.9 F (34.9 C), temperature source Rectal, resp. rate 20, height _0  (1.702 m), weight 79.4 kg, SpO2 91 %. Physical Exam  Constitutional: She appears well-developed.  HENT:  Head:    Eyes: Right conjunctiva has a hemorrhage.  Neck: Normal range of motion.  In c-collar.  Cardiovascular: Normal rate and regular rhythm.  Respiratory: Effort normal and breath sounds normal. She exhibits no tenderness.  GI: Soft. She exhibits no distension. There is no abdominal tenderness. There is no rebound.  Musculoskeletal: Normal range of motion.  Neurological: She is alert. She has normal strength. No sensory deficit. GCS eye subscore is 4. GCS verbal subscore is 5. GCS motor subscore is 6.  Skin: Skin is warm and dry.  Psychiatric: She has a normal mood and affect. Her behavior is normal.     Assessment/Plan Fall  Pneumocephalus with subarachnoid blood-neurosurgery has  been consulted  Facial fractures-ENT consulted  History of hypertension-placed on hydralazine now and metoprolol IV until medication list can be obtained from primary care provider in [place] to ICU for neuro checks and close follow-up  Turner Daniels, MD 04/10/2019, 8:53 PM

## 2019-03-28 NOTE — ED Notes (Signed)
Date and time results received: 04/01/2019 1835 (use smartphrase ".now" to insert current time)  Test: lactic acid Critical Value: 5.7  Name of Provider Notified: Trifan MD  Orders Received? Or Actions Taken?: Orders Received - See Orders for details

## 2019-03-28 NOTE — ED Notes (Signed)
Pt had one episode of emesis just prior to CT scan

## 2019-03-28 NOTE — ED Notes (Signed)
Patient transported to CT 

## 2019-03-28 NOTE — Progress Notes (Signed)
Patient ID: [name], female   DOB: [birth year], 85 y.o.   MRN: [id number] CT reviewed, full consult to follow.  SAH, small contusion, extensive pneumocephalus secondary to facial and skull fractures, no mass effect in elderly patient with extensive cortical atrophy.  No intervention needed at this time.   Repeat CT in am, 3 doses of broad spectrum antibiotics for pneumocephalus, and CTA head and neck due to close proximity of fractures to carotid canal.

## 2019-03-28 NOTE — Consult Note (Signed)
Visited briefly and prayed with pt and her daughter together just after daughter entered Trauma B to see pt. Family is [place]. Let daughter know she could ask a nurse to page chaplain at any time. Standing by.  Rev. Eloise Levels Chaplain

## 2019-03-28 NOTE — ED Notes (Signed)
Main lab to add on T4

## 2019-03-28 NOTE — Progress Notes (Signed)
Orthopedic Tech Progress Note Patient Details:  [name] 01/16/1930 [id number]  Patient ID: [name], female   DOB: 07-20-1929, 85 y.o.   MRN: [id number]   [place] Pink 04/14/2019, 6:38 PMLevel 2 Trauma

## 2019-03-28 NOTE — Consult Note (Signed)
Responded to page, pt unavailable, no family present, staff will page again if further need of chaplain services.  Rev. Addysyn Fern Chaplain 

## 2019-03-28 NOTE — Consult Note (Signed)
Reason for Consult: Facial trauma, s/p fall Referring Physician: Octaviano Glow, MD  HPI:  [name] is an 85 y.o. female who was found down in her garage. She was previously seen 6 hours earlier. The likely fall was unwitnessed. She was found to have significant facial injury and bruising. Her CT scan shows complex facial bone fractures, including a left LeFort type 1 and type 3 fracture patterns. A right lateral orbital fracture extending into the maxilla. Additional fractures include the posterior nasal septum and comminuted fractures of the sphenoid sinuses fractures.  History reviewed. No pertinent past medical history.  History reviewed. No pertinent surgical history.  No family history on file.  Social History:  has no history on file for tobacco, alcohol, and drug.  Allergies: Not on File  Prior to Admission medications   Not on File    Medications:  I have reviewed the patient's current medications. Scheduled:  Continuous: . dextrose 5 % and 0.9% NaCl    . levETIRAcetam    . niCARDipine 7.5 mg/hr (03/22/2019 2133)  . vancomycin 1,750 mg (04/16/2019 2131)    Results for orders placed or performed during the hospital encounter of 04/01/2019 (from the past 48 hour(s))  Ethanol     Status: None   Collection Time: 04/16/2019  5:57 PM  Result Value Ref Range   Alcohol, Ethyl (B) <10 <10 mg/dL    Comment: (NOTE) Lowest detectable limit for serum alcohol is 10 mg/dL. For medical purposes only. Performed at [facility], [place] [address], [place], [place] [address]   Lactic acid, plasma     Status: Abnormal   Collection Time: 03/30/2019  5:57 PM  Result Value Ref Range   Lactic Acid, Venous 5.7 (HH) 0.5 - 1.9 mmol/L    Comment: CRITICAL RESULT CALLED TO, READ BACK BY AND VERIFIED WITH: C.MARSHALL,RN [id number] I.MANNING Performed at [facility], [place] [address], [place], [place] [address]   Sample to Blood Bank     Status: None   Collection Time: 04/19/2019   5:57 PM  Result Value Ref Range   Blood Bank Specimen SAMPLE AVAILABLE FOR TESTING    Sample Expiration      2019/03/31,2359 Performed at [facility], [place] [address] [place], [place] [address]   CDS serology     Status: None   Collection Time: 04/11/2019  5:58 PM  Result Value Ref Range   CDS serology specimen      SPECIMEN WILL BE HELD FOR 14 DAYS IF TESTING IS REQUIRED    Comment: Performed at [facility], [place] [address], [place], [place] [address]  Comprehensive metabolic panel     Status: Abnormal   Collection Time: 03/24/2019  5:58 PM  Result Value Ref Range   Sodium 140 135 - 145 mmol/L   Potassium 3.8 3.5 - 5.1 mmol/L   Chloride 107 98 - 111 mmol/L   CO2 18 (L) 22 - 32 mmol/L   Glucose, Bld 183 (H) 70 - 99 mg/dL   BUN 18 8 - 23 mg/dL   Creatinine, Ser 0.88 0.44 - 1.00 mg/dL   Calcium 9.3 8.9 - 10.3 mg/dL   Total Protein 6.6 6.5 - 8.1 g/dL   Albumin 3.9 3.5 - 5.0 g/dL   AST 39 15 - 41 U/L   ALT 25 0 - 44 U/L   Alkaline Phosphatase 69 38 - 126 U/L   Total Bilirubin 0.7 0.3 - 1.2 mg/dL   GFR calc  non Af Amer 59 (L) >60 mL/min   GFR calc Af Amer >60 >60 mL/min   Anion gap 15 5 - 15    Comment: Performed at [facility] [address], [place], [place] [address]  CBC     Status: Abnormal   Collection Time: 04/05/2019  5:58 PM  Result Value Ref Range   WBC 27.6 (H) 4.0 - 10.5 K/uL   RBC 3.93 3.87 - 5.11 MIL/uL   Hemoglobin 13.2 12.0 - 15.0 g/dL   HCT 40.7 36.0 - 46.0 %   MCV 103.6 (H) 80.0 - 100.0 fL   MCH 33.6 26.0 - 34.0 pg   MCHC 32.4 30.0 - 36.0 g/dL   RDW 12.2 11.5 - 15.5 %   Platelets 190 150 - 400 K/uL   nRBC 0.0 0.0 - 0.2 %    Comment: Performed at [facility], [place] [address] [place], [place] [address]  Protime-INR     Status: None   Collection Time: 04/14/2019  5:58 PM  Result Value Ref Range   Prothrombin Time 14.6 11.4 - 15.2 seconds   INR 1.2 0.8 - 1.2    Comment: (NOTE) INR goal varies based on device and disease  states. Performed at [facility], [place] [address] [place], [place] [address]   Troponin I (High Sensitivity)     Status: None   Collection Time: 03/27/2019  5:58 PM  Result Value Ref Range   Troponin I (High Sensitivity) 10 <18 ng/L    Comment: (NOTE) Elevated high sensitivity troponin I (hsTnI) values and significant  changes across serial measurements may suggest ACS but many other  chronic and acute conditions are known to elevate hsTnI results.  Refer to the "Links" section for chest pain algorithms and additional  guidance. Performed at [facility], [place] [address] [place], [place] [address]   CK     Status: None   Collection Time: 04/18/2019  5:58 PM  Result Value Ref Range   Total CK 220 38 - 234 U/L    Comment: Performed at [facility], [place] [address], [place], [place] [address]  CBC with Differential/Platelet     Status: Abnormal   Collection Time: 04/14/2019  5:58 PM  Result Value Ref Range   WBC 27.6 (H) 4.0 - 10.5 K/uL   RBC 4.02 3.87 - 5.11 MIL/uL   Hemoglobin 13.1 12.0 - 15.0 g/dL   HCT 41.6 36.0 - 46.0 %   MCV 103.5 (H) 80.0 - 100.0 fL   MCH 32.6 26.0 - 34.0 pg   MCHC 31.5 30.0 - 36.0 g/dL   RDW 12.3 11.5 - 15.5 %   Platelets 200 150 - 400 K/uL   nRBC 0.0 0.0 - 0.2 %   Neutrophils Relative % 81 %   Neutro Abs 22.4 (H) 1.7 - 7.7 K/uL   Lymphocytes Relative 14 %   Lymphs Abs 3.9 0.7 - 4.0 K/uL   Monocytes Relative 5 %   Monocytes Absolute 1.4 (H) 0.1 - 1.0 K/uL   Eosinophils Relative 0 %   Eosinophils Absolute 0.0 0.0 - 0.5 K/uL   Basophils Relative 0 %   Basophils Absolute 0.0 0.0 - 0.1 K/uL   nRBC 0 0 /100 WBC   Abs Immature Granulocytes 0.00 0.00 - 0.07 K/uL    Comment: Performed at [facility], [place] [address], [place], [place] [address]  I-stat chem 8, ED     Status: Abnormal  Collection Time: 04/10/2019  6:06 PM  Result Value Ref Range   Sodium 141 135 - 145 mmol/L   Potassium 3.7 3.5 - 5.1 mmol/L   Chloride 106 98 - 111  mmol/L   BUN 21 8 - 23 mg/dL   Creatinine, Ser 0.80 0.44 - 1.00 mg/dL   Glucose, Bld 180 (H) 70 - 99 mg/dL   Calcium, Ion 1.16 1.15 - 1.40 mmol/L   TCO2 23 22 - 32 mmol/L   Hemoglobin 13.9 12.0 - 15.0 g/dL   HCT 41.0 36.0 - 46.0 %  TSH     Status: Abnormal   Collection Time: 03/22/2019  6:48 PM  Result Value Ref Range   TSH 6.059 (H) 0.350 - 4.500 uIU/mL    Comment: Performed by a 3rd Generation assay with a functional sensitivity of <=0.01 uIU/mL. Performed at [facility], [place] [address], [place], [place] [address]   POC SARS Coronavirus 2 Ag-ED - Nasal Swab (BD Veritor Kit)     Status: None   Collection Time: 04/12/2019  7:19 PM  Result Value Ref Range   SARS Coronavirus 2 Ag NEGATIVE NEGATIVE    Comment: (NOTE) SARS-CoV-2 antigen NOT DETECTED.  Negative results are presumptive.  Negative results do not preclude SARS-CoV-2 infection and should not be used as the sole basis for treatment or other patient management decisions, including infection  control decisions, particularly in the presence of clinical signs and  symptoms consistent with COVID-19, or in those who have been in contact with the virus.  Negative results must be combined with clinical observations, patient history, and epidemiological information. The expected result is Negative. Fact Sheet for Patients: PodPark.tn Fact Sheet for Healthcare Providers: GiftContent.is This test is not yet approved or cleared by the [place] FDA and  has been authorized for detection and/or diagnosis of SARS-CoV-2 by FDA under an Emergency Use Authorization (EUA).  This EUA will remain in effect (meaning this test can be used) for the duration of  the COVID-19 de claration under Section 564(b)(1) of the Act, 21 U.S.C. section 360bbb-3(b)(1), unless the authorization is terminated or revoked sooner.   Urinalysis, Routine w reflex microscopic     Status: None    Collection Time: 03/20/2019  7:35 PM  Result Value Ref Range   Color, Urine YELLOW YELLOW   APPearance CLEAR CLEAR   Specific Gravity, Urine 1.015 1.005 - 1.030   pH 5.0 5.0 - 8.0   Glucose, UA NEGATIVE NEGATIVE mg/dL   Hgb urine dipstick NEGATIVE NEGATIVE   Bilirubin Urine NEGATIVE NEGATIVE   Ketones, ur NEGATIVE NEGATIVE mg/dL   Protein, ur NEGATIVE NEGATIVE mg/dL   Nitrite NEGATIVE NEGATIVE   Leukocytes,Ua NEGATIVE NEGATIVE    Comment: Performed at [facility] [address], [place], [place] [address]  Troponin I (High Sensitivity)     Status: Abnormal   Collection Time: 04/16/2019  8:01 PM  Result Value Ref Range   Troponin I (High Sensitivity) 18 (H) <18 ng/L    Comment: (NOTE) Elevated high sensitivity troponin I (hsTnI) values and significant  changes across serial measurements may suggest ACS but many other  chronic and acute conditions are known to elevate hsTnI results.  Refer to the "Links" section for chest pain algorithms and additional  guidance. Performed at [facility], [place] [address], [place], [place] [address]     Ct Head Wo Contrast  Result Date: 04/11/2019 CLINICAL DATA:  Found on ground in garage in pool of  blood, last known normal 10 a.m. EXAM: CT HEAD WITHOUT CONTRAST CT MAXILLOFACIAL WITHOUT CONTRAST CT CERVICAL SPINE WITHOUT CONTRAST TECHNIQUE: Multidetector CT imaging of the head, cervical spine, and maxillofacial structures were performed using the standard protocol without intravenous contrast. Multiplanar CT image reconstructions of the cervical spine and maxillofacial structures were also generated. COMPARISON:  CT head 02/08/2019, MR 02/09/2019 FINDINGS: CT HEAD FINDINGS Brain: Parenchymal hemorrhage seen in the mesial right temporal lobe. Hemorrhagic parenchymal contusive changes are noted along the right frontal convexity and right temporal lobe. Additional multifocal sites of subarachnoid and subdural hemorrhage are noted along the falx and  tentorium as well as layering within both sylvian fissures with some intraventricular hemorrhage noted in the left lateral ventricle. Additional hemorrhage seen in the quadrigeminal cistern. Scattered pneumocephalus is seen throughout both hemispheres and within the posterior fossa. No midline shift is seen. Findings on a background of chronic microvascular angiopathy and parenchymal volume loss. Vascular: Atherosclerotic calcification of the carotid siphons and intradural vertebral arteries. No hyperdense vessel. Several foci of gas are seen within the cavernous sinuses. Skull: Extensive left frontotemporal scalp swelling and contusion with large crescentic hematoma measuring up to 9 mm in maximal thickness. Additional swelling extends into the frontal scalp and supraorbital tissues. Other: Patient motion artifact may limit detection of subtle, nondisplaced skull base and calvarial fractures. CT MAXILLOFACIAL FINDINGS Osseous: There is a comminuted fracture of the left pterygoid plates. Complex facial bone fractures include a left LeFort type 1 pattern with fractures through the medial and lateral walls maxillary sinus extending into the alveolar ridge likely involving the nasal septum. There is additional left LeFort type 3 injury with involving fractures through the medial and lateral walls of the left orbit, left zygomatic arch and left nasofrontal suture. Additional fractures are seen through the posterior nasal septum as well as the through the lateral, posterior and superior walls of the bilateral sphenoid sinuses and sphenoid sinus septum. Fractures extend in close proximity to both the optic and carotid canals likely providing the site of ingress for the intracranial gas detailed above. Additional fracture through the right lateral orbit into the zygomatic process of the right maxilla. Patient is largely edentulous. The mandible appears to be intact temporomandibular joints are normally aligned. Orbits:  There is extraconal retroseptal gas and hemorrhage seen in the left orbit. Some thickening of the lateral rectus is noted as it comes in close proximity to the lateral orbital fracture. Small amount of hemorrhage is seen in the lateral floor of the right orbit as well without retro septal gas. The globes appear normal and symmetric. Symmetric appearance of the optic nerve sheath complexes. Normal caliber of the superior ophthalmic veins. Sinuses: There is extensive hyperdense hemosinus with pneumatized secretions in air-fluid levels throughout the left maxillary and sphenoid sinuses as well as near complete opacification of the ethmoids, left greater than right. Mastoid air cells remain well aerated. Middle ear cavities are clear. There is nonspecific soft tissue occlusion of the left external auditory canal. Ossicular chains appear normally configured. Soft tissues: There is extensive left frontal and periorbital soft tissue swelling extending across the eyelids to the medial nasal bridge and into the may large soft tissues inferiorly. Posteriorly this extends in continuity with the large left frontal scalp contusion and probable laceration with few punctate foci of gas. There is milder right periorbital soft tissue swelling and hematoma. Focal left pre mandibular soft tissue swelling and stranding is noted as well. CT CERVICAL SPINE FINDINGS Alignment: Reversal of  the normal cervical lordosis likely on a degenerative basis. No traumatic listhesis or abnormal facet widening is seen. Craniocervical atlantoaxial articulation is maintained. Skull base and vertebrae: Motion artifact may limit detection of subtle, nondisplaced fractures. No definite acute fracture or suspicious osseous lesion of the cervical spine is seen. Soft tissues and spinal canal: No pre or paravertebral fluid or swelling. No visible canal hematoma. Disc levels: Multilevel diffuse intervertebral disc height loss is noted of the cervical spine most  pronounced from C5-C7. No severe canal stenosis or foraminal narrowing. Upper chest: Emphysematous changes noted in the lung apices. Other: Cervical carotid atherosclerosis. Diminutive appearance of the thyroid. IMPRESSION: 1. Hemorrhagic parenchymal contusive changes along the right frontal convexity and right temporal lobe. Additional multifocal sites of subarachnoid and subdural hemorrhage are seen along the falx, tentorium, and within the sylvian fissures. Hemorrhage layering within the left lateral ventricle. Additional hemorrhage seen in the quadrigeminal cistern. Convincing features of active or pending herniation at this time. 2. Scattered pneumocephalus is seen throughout both hemispheres and within the posterior fossa. 3. Complex facial bone fractures as detailed above, including a left LeFort type 1 and type 3 fracture patterns. A right lateral orbital fracture extending into the cycle medic process of the maxilla. 4. Additional fractures of the posterior nasal septum and comminuted fractures of the sphenoid sinuses fractures extend in close proximity to both the optic and carotid canals likely providing the site of ingress for the intracranial gas detailed above. Consider further evaluation with CT angiography of the head and neck to assess for potential vascular injury. 5. Extensive associated hemosinus. 6. Extensive left frontal scalp, left periorbital and malar soft tissue swelling and contusion with large crescentic hematoma measuring up to 9 mm in maximal thickness. Left pre mandibular soft tissue swelling is noted as well. 7. Second separate site of contusion and hematoma in the right periorbital soft tissues as well. 8. No evidence of acute traumatic injury to the cervical spine though evaluation is somewhat limited by motion artifact. 9. Multilevel degenerative changes of the cervical spine are detailed above. 10. Nonspecific soft tissue occlusion of the left external auditory canal. Correlation  with direct visualization is recommended. These results were called by telephone at the time of interpretation on 04/08/2019 at 7:57 pm to provider Dr. Langston Masker, who verbally acknowledged these results. Electronically Signed   By: Lovena Le M.D.   On: 04/03/2019 19:59   Ct Cervical Spine Wo Contrast  Result Date: 03/25/2019 CLINICAL DATA:  Found on ground in garage in pool of blood, last known normal 10 a.m. EXAM: CT HEAD WITHOUT CONTRAST CT MAXILLOFACIAL WITHOUT CONTRAST CT CERVICAL SPINE WITHOUT CONTRAST TECHNIQUE: Multidetector CT imaging of the head, cervical spine, and maxillofacial structures were performed using the standard protocol without intravenous contrast. Multiplanar CT image reconstructions of the cervical spine and maxillofacial structures were also generated. COMPARISON:  CT head 02/08/2019, MR 02/09/2019 FINDINGS: CT HEAD FINDINGS Brain: Parenchymal hemorrhage seen in the mesial right temporal lobe. Hemorrhagic parenchymal contusive changes are noted along the right frontal convexity and right temporal lobe. Additional multifocal sites of subarachnoid and subdural hemorrhage are noted along the falx and tentorium as well as layering within both sylvian fissures with some intraventricular hemorrhage noted in the left lateral ventricle. Additional hemorrhage seen in the quadrigeminal cistern. Scattered pneumocephalus is seen throughout both hemispheres and within the posterior fossa. No midline shift is seen. Findings on a background of chronic microvascular angiopathy and parenchymal volume loss. Vascular: Atherosclerotic calcification of the  carotid siphons and intradural vertebral arteries. No hyperdense vessel. Several foci of gas are seen within the cavernous sinuses. Skull: Extensive left frontotemporal scalp swelling and contusion with large crescentic hematoma measuring up to 9 mm in maximal thickness. Additional swelling extends into the frontal scalp and supraorbital tissues. Other:  Patient motion artifact may limit detection of subtle, nondisplaced skull base and calvarial fractures. CT MAXILLOFACIAL FINDINGS Osseous: There is a comminuted fracture of the left pterygoid plates. Complex facial bone fractures include a left LeFort type 1 pattern with fractures through the medial and lateral walls maxillary sinus extending into the alveolar ridge likely involving the nasal septum. There is additional left LeFort type 3 injury with involving fractures through the medial and lateral walls of the left orbit, left zygomatic arch and left nasofrontal suture. Additional fractures are seen through the posterior nasal septum as well as the through the lateral, posterior and superior walls of the bilateral sphenoid sinuses and sphenoid sinus septum. Fractures extend in close proximity to both the optic and carotid canals likely providing the site of ingress for the intracranial gas detailed above. Additional fracture through the right lateral orbit into the zygomatic process of the right maxilla. Patient is largely edentulous. The mandible appears to be intact temporomandibular joints are normally aligned. Orbits: There is extraconal retroseptal gas and hemorrhage seen in the left orbit. Some thickening of the lateral rectus is noted as it comes in close proximity to the lateral orbital fracture. Small amount of hemorrhage is seen in the lateral floor of the right orbit as well without retro septal gas. The globes appear normal and symmetric. Symmetric appearance of the optic nerve sheath complexes. Normal caliber of the superior ophthalmic veins. Sinuses: There is extensive hyperdense hemosinus with pneumatized secretions in air-fluid levels throughout the left maxillary and sphenoid sinuses as well as near complete opacification of the ethmoids, left greater than right. Mastoid air cells remain well aerated. Middle ear cavities are clear. There is nonspecific soft tissue occlusion of the left external  auditory canal. Ossicular chains appear normally configured. Soft tissues: There is extensive left frontal and periorbital soft tissue swelling extending across the eyelids to the medial nasal bridge and into the may large soft tissues inferiorly. Posteriorly this extends in continuity with the large left frontal scalp contusion and probable laceration with few punctate foci of gas. There is milder right periorbital soft tissue swelling and hematoma. Focal left pre mandibular soft tissue swelling and stranding is noted as well. CT CERVICAL SPINE FINDINGS Alignment: Reversal of the normal cervical lordosis likely on a degenerative basis. No traumatic listhesis or abnormal facet widening is seen. Craniocervical atlantoaxial articulation is maintained. Skull base and vertebrae: Motion artifact may limit detection of subtle, nondisplaced fractures. No definite acute fracture or suspicious osseous lesion of the cervical spine is seen. Soft tissues and spinal canal: No pre or paravertebral fluid or swelling. No visible canal hematoma. Disc levels: Multilevel diffuse intervertebral disc height loss is noted of the cervical spine most pronounced from C5-C7. No severe canal stenosis or foraminal narrowing. Upper chest: Emphysematous changes noted in the lung apices. Other: Cervical carotid atherosclerosis. Diminutive appearance of the thyroid. IMPRESSION: 1. Hemorrhagic parenchymal contusive changes along the right frontal convexity and right temporal lobe. Additional multifocal sites of subarachnoid and subdural hemorrhage are seen along the falx, tentorium, and within the sylvian fissures. Hemorrhage layering within the left lateral ventricle. Additional hemorrhage seen in the quadrigeminal cistern. Convincing features of active or pending herniation at  this time. 2. Scattered pneumocephalus is seen throughout both hemispheres and within the posterior fossa. 3. Complex facial bone fractures as detailed above, including a  left LeFort type 1 and type 3 fracture patterns. A right lateral orbital fracture extending into the cycle medic process of the maxilla. 4. Additional fractures of the posterior nasal septum and comminuted fractures of the sphenoid sinuses fractures extend in close proximity to both the optic and carotid canals likely providing the site of ingress for the intracranial gas detailed above. Consider further evaluation with CT angiography of the head and neck to assess for potential vascular injury. 5. Extensive associated hemosinus. 6. Extensive left frontal scalp, left periorbital and malar soft tissue swelling and contusion with large crescentic hematoma measuring up to 9 mm in maximal thickness. Left pre mandibular soft tissue swelling is noted as well. 7. Second separate site of contusion and hematoma in the right periorbital soft tissues as well. 8. No evidence of acute traumatic injury to the cervical spine though evaluation is somewhat limited by motion artifact. 9. Multilevel degenerative changes of the cervical spine are detailed above. 10. Nonspecific soft tissue occlusion of the left external auditory canal. Correlation with direct visualization is recommended. These results were called by telephone at the time of interpretation on 04/02/2019 at 7:57 pm to provider Dr. Langston Masker, who verbally acknowledged these results. Electronically Signed   By: Lovena Le M.D.   On: 03/30/2019 19:59   Dg Pelvis Portable  Result Date: 03/27/2019 CLINICAL DATA:  Fall, level 2 trauma EXAM: PORTABLE PELVIS 1-2 VIEWS COMPARISON:  None. FINDINGS: The osseous structures appear diffusely demineralized which may limit detection of small or nondisplaced fractures. Bones of the pelvis appear intact and congruent. Femoral heads remain normally located. Proximal femora are intact. Extensive degenerative changes noted in the lower lumbar spine and lumbosacral junction. Moderate degenerative changes in the hips. Normal nonobstructive  bowel gas pattern. Atherosclerotic calcifications of vascular stenting noted in the low abdomen. Remaining soft tissues are unremarkable. IMPRESSION: 1. No acute fracture or dislocation identified. 2. Diffuse osseous demineralization which may limit detection of small or nondisplaced fractures. 3. Degenerative changes in the lower lumbar spine and lumbosacral junction as well as both hips. Electronically Signed   By: Lovena Le M.D.   On: 04/18/2019 18:33   Dg Chest Port 1 View  Result Date: 04/09/2019 CLINICAL DATA:  Fall, level 2 trauma EXAM: PORTABLE CHEST 1 VIEW COMPARISON:  Radiograph 01/19/2019, CTA chest 04/09/2018 FINDINGS: No consolidation, features of edema, pneumothorax, or effusion. The aorta is calcified. The remaining cardiomediastinal contours are unremarkable. Abundant pericardial fat result in increased attenuation in the left lung base corresponding well to CT findings from 2019. No acute osseous or soft tissue abnormality. Dextrocurvature of the upper thoracic spine is noted. Degenerative changes are present in the imaged spine and shoulders. Remote left lateral third rib fracture. IMPRESSION: 1. No acute cardiopulmonary or traumatic finding in the chest. 2. Abundant pericardial fat resulting in increased attenuation in the left lung base, corresponding to CT findings from 2019. 3.  Aortic Atherosclerosis (ICD10-I70.0). Electronically Signed   By: Lovena Le M.D.   On: 04/06/2019 18:24   Ct Maxillofacial Wo Contrast  Result Date: 03/26/2019 CLINICAL DATA:  Found on ground in garage in pool of blood, last known normal 10 a.m. EXAM: CT HEAD WITHOUT CONTRAST CT MAXILLOFACIAL WITHOUT CONTRAST CT CERVICAL SPINE WITHOUT CONTRAST TECHNIQUE: Multidetector CT imaging of the head, cervical spine, and maxillofacial structures were performed using the standard  protocol without intravenous contrast. Multiplanar CT image reconstructions of the cervical spine and maxillofacial structures were also  generated. COMPARISON:  CT head 02/08/2019, MR 02/09/2019 FINDINGS: CT HEAD FINDINGS Brain: Parenchymal hemorrhage seen in the mesial right temporal lobe. Hemorrhagic parenchymal contusive changes are noted along the right frontal convexity and right temporal lobe. Additional multifocal sites of subarachnoid and subdural hemorrhage are noted along the falx and tentorium as well as layering within both sylvian fissures with some intraventricular hemorrhage noted in the left lateral ventricle. Additional hemorrhage seen in the quadrigeminal cistern. Scattered pneumocephalus is seen throughout both hemispheres and within the posterior fossa. No midline shift is seen. Findings on a background of chronic microvascular angiopathy and parenchymal volume loss. Vascular: Atherosclerotic calcification of the carotid siphons and intradural vertebral arteries. No hyperdense vessel. Several foci of gas are seen within the cavernous sinuses. Skull: Extensive left frontotemporal scalp swelling and contusion with large crescentic hematoma measuring up to 9 mm in maximal thickness. Additional swelling extends into the frontal scalp and supraorbital tissues. Other: Patient motion artifact may limit detection of subtle, nondisplaced skull base and calvarial fractures. CT MAXILLOFACIAL FINDINGS Osseous: There is a comminuted fracture of the left pterygoid plates. Complex facial bone fractures include a left LeFort type 1 pattern with fractures through the medial and lateral walls maxillary sinus extending into the alveolar ridge likely involving the nasal septum. There is additional left LeFort type 3 injury with involving fractures through the medial and lateral walls of the left orbit, left zygomatic arch and left nasofrontal suture. Additional fractures are seen through the posterior nasal septum as well as the through the lateral, posterior and superior walls of the bilateral sphenoid sinuses and sphenoid sinus septum. Fractures  extend in close proximity to both the optic and carotid canals likely providing the site of ingress for the intracranial gas detailed above. Additional fracture through the right lateral orbit into the zygomatic process of the right maxilla. Patient is largely edentulous. The mandible appears to be intact temporomandibular joints are normally aligned. Orbits: There is extraconal retroseptal gas and hemorrhage seen in the left orbit. Some thickening of the lateral rectus is noted as it comes in close proximity to the lateral orbital fracture. Small amount of hemorrhage is seen in the lateral floor of the right orbit as well without retro septal gas. The globes appear normal and symmetric. Symmetric appearance of the optic nerve sheath complexes. Normal caliber of the superior ophthalmic veins. Sinuses: There is extensive hyperdense hemosinus with pneumatized secretions in air-fluid levels throughout the left maxillary and sphenoid sinuses as well as near complete opacification of the ethmoids, left greater than right. Mastoid air cells remain well aerated. Middle ear cavities are clear. There is nonspecific soft tissue occlusion of the left external auditory canal. Ossicular chains appear normally configured. Soft tissues: There is extensive left frontal and periorbital soft tissue swelling extending across the eyelids to the medial nasal bridge and into the may large soft tissues inferiorly. Posteriorly this extends in continuity with the large left frontal scalp contusion and probable laceration with few punctate foci of gas. There is milder right periorbital soft tissue swelling and hematoma. Focal left pre mandibular soft tissue swelling and stranding is noted as well. CT CERVICAL SPINE FINDINGS Alignment: Reversal of the normal cervical lordosis likely on a degenerative basis. No traumatic listhesis or abnormal facet widening is seen. Craniocervical atlantoaxial articulation is maintained. Skull base and  vertebrae: Motion artifact may limit detection of subtle, nondisplaced fractures.  No definite acute fracture or suspicious osseous lesion of the cervical spine is seen. Soft tissues and spinal canal: No pre or paravertebral fluid or swelling. No visible canal hematoma. Disc levels: Multilevel diffuse intervertebral disc height loss is noted of the cervical spine most pronounced from C5-C7. No severe canal stenosis or foraminal narrowing. Upper chest: Emphysematous changes noted in the lung apices. Other: Cervical carotid atherosclerosis. Diminutive appearance of the thyroid. IMPRESSION: 1. Hemorrhagic parenchymal contusive changes along the right frontal convexity and right temporal lobe. Additional multifocal sites of subarachnoid and subdural hemorrhage are seen along the falx, tentorium, and within the sylvian fissures. Hemorrhage layering within the left lateral ventricle. Additional hemorrhage seen in the quadrigeminal cistern. Convincing features of active or pending herniation at this time. 2. Scattered pneumocephalus is seen throughout both hemispheres and within the posterior fossa. 3. Complex facial bone fractures as detailed above, including a left LeFort type 1 and type 3 fracture patterns. A right lateral orbital fracture extending into the cycle medic process of the maxilla. 4. Additional fractures of the posterior nasal septum and comminuted fractures of the sphenoid sinuses fractures extend in close proximity to both the optic and carotid canals likely providing the site of ingress for the intracranial gas detailed above. Consider further evaluation with CT angiography of the head and neck to assess for potential vascular injury. 5. Extensive associated hemosinus. 6. Extensive left frontal scalp, left periorbital and malar soft tissue swelling and contusion with large crescentic hematoma measuring up to 9 mm in maximal thickness. Left pre mandibular soft tissue swelling is noted as well. 7. Second  separate site of contusion and hematoma in the right periorbital soft tissues as well. 8. No evidence of acute traumatic injury to the cervical spine though evaluation is somewhat limited by motion artifact. 9. Multilevel degenerative changes of the cervical spine are detailed above. 10. Nonspecific soft tissue occlusion of the left external auditory canal. Correlation with direct visualization is recommended. These results were called by telephone at the time of interpretation on 04/10/2019 at 7:57 pm to provider Dr. Langston Masker, who verbally acknowledged these results. Electronically Signed   By: Lovena Le M.D.   On: 04/01/2019 19:59   Review of Systems: Unable to perform due to her severe hearing loss.  Blood pressure (!) 131/56, pulse 79, temperature 97.6 F (36.4 C), temperature source Axillary, resp. rate 20, height _0  (1.702 m), weight 79.4 kg, SpO2 94 %. General appearance: mild distress and pale Head: Facial contusion, bilateral periorbital ecchymosis and edema. Eyes: Her pupils are equal, round, reactive to light. Extraocular motion is sluggish but grossly intact.  Ears: Examination of the ears shows normal auricles bilaterally. Blood in the left ear canal. Right EAC is normal. Nose: Nasal dorsum is edematous. Nasal mucosa also severely edematous. Face: Facial examination shows edema and ecchymosis. No asymmetry. Palpation of the face elicit tenderness. No significant stepoff. Mouth: Oral cavity examination shows no mucosal lacerations. No significant trismus is noted.  Neck:  Cervical collar in place. Neuro: Awake and cooperative. Severely hard of hearing.  Assessment/Plan: Complex facial fractures, including a left LeFort type 1 and type 3 fracture patterns and a right lateral orbital fracture. However, none of the fractures is significantly displaced. - In light of her advanced age, will proceed with non-operative treatment. - Will re-evaluate once her facial swelling subsides. -  Will follow.  Siria Calandro W Carleton [place] 03/24/2019, 9:27 PM

## 2019-03-28 NOTE — ED Notes (Signed)
Pt c.o being hot, bair hugger removed

## 2019-03-29 ENCOUNTER — Inpatient Hospital Stay (HOSPITAL_COMMUNITY): Payer: Medicare HMO

## 2019-03-29 DIAGNOSIS — S02413A LeFort III fracture, initial encounter for closed fracture: Secondary | ICD-10-CM

## 2019-03-29 DIAGNOSIS — S02411A LeFort I fracture, initial encounter for closed fracture: Secondary | ICD-10-CM

## 2019-03-29 LAB — URINE CULTURE: Culture: NO GROWTH

## 2019-03-29 LAB — POCT I-STAT 7, (LYTES, BLD GAS, ICA,H+H)
Acid-base deficit: 4 mmol/L — ABNORMAL HIGH (ref 0.0–2.0)
Bicarbonate: 22.4 mmol/L (ref 20.0–28.0)
Calcium, Ion: 1.21 mmol/L (ref 1.15–1.40)
HCT: 32 % — ABNORMAL LOW (ref 36.0–46.0)
Hemoglobin: 10.9 g/dL — ABNORMAL LOW (ref 12.0–15.0)
O2 Saturation: 89 %
Patient temperature: 98.6
Potassium: 3.6 mmol/L (ref 3.5–5.1)
Sodium: 142 mmol/L (ref 135–145)
TCO2: 24 mmol/L (ref 22–32)
pCO2 arterial: 48.2 mmHg — ABNORMAL HIGH (ref 32.0–48.0)
pH, Arterial: 7.276 — ABNORMAL LOW (ref 7.350–7.450)
pO2, Arterial: 64 mmHg — ABNORMAL LOW (ref 83.0–108.0)

## 2019-03-29 MED ORDER — MORPHINE BOLUS VIA INFUSION
5.0000 mg | INTRAVENOUS | Status: DC | PRN
  Filled 2019-03-29: qty 5

## 2019-03-29 MED ORDER — GLYCOPYRROLATE 0.2 MG/ML IJ SOLN
0.2000 mg | INTRAMUSCULAR | Status: DC | PRN
  Administered 2019-03-29: 0.2 mg via INTRAVENOUS
  Filled 2019-03-29: qty 1

## 2019-03-29 MED ORDER — NALOXONE HCL 0.4 MG/ML IJ SOLN
INTRAMUSCULAR | Status: AC
  Administered 2019-03-29: 03:00:00
  Filled 2019-03-29: qty 1

## 2019-03-29 MED ORDER — LORAZEPAM 2 MG/ML IJ SOLN: 1.0000 mg | Freq: Once | INTRAMUSCULAR | Status: AC

## 2019-03-29 MED ORDER — ACETAMINOPHEN 650 MG RE SUPP: 650.0000 mg | Freq: Four times a day (QID) | RECTAL | Status: DC | PRN

## 2019-03-29 MED ORDER — LORAZEPAM 2 MG/ML IJ SOLN: 2.0000 mg | INTRAMUSCULAR | Status: DC | PRN

## 2019-03-29 MED ORDER — MORPHINE SULFATE (PF) 2 MG/ML IV SOLN: 2.0000 mg | INTRAVENOUS | Status: DC | PRN

## 2019-03-29 MED ORDER — POLYVINYL ALCOHOL 1.4 % OP SOLN
1.0000 [drp] | Freq: Four times a day (QID) | OPHTHALMIC | Status: DC | PRN
  Filled 2019-03-29: qty 15

## 2019-03-29 MED ORDER — MORPHINE 100MG IN NS 100ML (1MG/ML) PREMIX INFUSION
0.0000 mg/h | INTRAVENOUS | Status: DC
  Administered 2019-03-29: 5 mg/h via INTRAVENOUS
  Filled 2019-03-29: qty 100

## 2019-03-29 MED ORDER — LORAZEPAM 2 MG/ML IJ SOLN
INTRAMUSCULAR | Status: AC
  Administered 2019-03-29: 1 mg via INTRAVENOUS
  Filled 2019-03-29: qty 1

## 2019-03-29 MED ORDER — SODIUM CHLORIDE 0.9% FLUSH: 10.0000 mL | INTRAVENOUS | Status: DC | PRN

## 2019-03-29 MED ORDER — GLYCOPYRROLATE 0.2 MG/ML IJ SOLN: 0.2000 mg | INTRAMUSCULAR | Status: DC | PRN

## 2019-03-29 MED ORDER — GLYCOPYRROLATE 1 MG PO TABS: 1.0000 mg | ORAL_TABLET | ORAL | Status: DC | PRN

## 2019-03-29 MED ORDER — ACETAMINOPHEN 325 MG PO TABS: 650.0000 mg | ORAL_TABLET | Freq: Four times a day (QID) | ORAL | Status: DC | PRN

## 2019-03-29 MED ORDER — DIPHENHYDRAMINE HCL 50 MG/ML IJ SOLN: 25.0000 mg | INTRAMUSCULAR | Status: DC | PRN

## 2019-03-30 ENCOUNTER — Encounter (HOSPITAL_COMMUNITY): Payer: Self-pay | Admitting: Internal Medicine

## 2019-03-30 ENCOUNTER — Other Ambulatory Visit: Payer: Self-pay | Admitting: Oncology

## 2019-04-20 NOTE — Progress Notes (Signed)
Patient was agitated asking for her xanax around 130am during system downtime. A verbal order for 1mg  of ativan from Dr. Brantley Stage were given at 141am. At 145 patient began to [place], patient went from [facility] to at 0230 ultimately requiring a NRB with breathing more labored. Decreased LOC also during this time. Cornett MD was paged during this time. A verbal order for narcan was given. Narcan was given at 0251. Blood gas was retrieved by RT, MD made aware. Patient LOC is improving. Still on NRB, No new orders at this time. Will continue to monitor patient closely.

## 2019-04-20 NOTE — Progress Notes (Signed)
Patient ID: [name], female   DOB: 1930/03/21, 86 y.o.   MRN: [id number] I met with her daughter, [name], who is one of her [facility] POA. We discussed [name]'s current condition and spoke about goals of care. [name] states [name] would not want to be intubated and would want to be a DNR in this situation. She gave the RN her papers to be scanned. For now, we will support her and see how her neurologic status progresses. Prognosis seems poor.  Georganna Skeans, MD, MPH, FACS Please use [email or web address] to contact on call provider

## 2019-04-20 NOTE — Progress Notes (Signed)
Patient ID: [name], female   DOB: 21-Dec-1929, 86 y.o.   MRN: [id number] Follow up - Trauma Critical Care  Patient Details:    [name] is an 86 y.o. female.  Lines/tubes : External Urinary Catheter (Active)  Collection Container Dedicated Suction Canister 2019-04-03 0800  Securement Method Securing device (Describe) 04/15/2019 2212  Site Assessment Clean;Intact;Dry April 03, 2019 0800  Output (mL) 100 mL 04/03/19 0600    Microbiology/Sepsis markers: No results found for this or any previous visit.  Anti-infectives:  Anti-infectives (From admission, onward)   Start     Dose/Rate Route Frequency Ordered Stop   04/03/2019 2045  vancomycin (VANCOCIN) 1,750 mg in sodium chloride 0.9 % 500 mL IVPB     1,750 mg 250 mL/hr over 120 Minutes Intravenous  Once 03/27/2019 2033 04/17/2019 2338   04/02/2019 1945  cefTRIAXone (ROCEPHIN) 1 g in sodium chloride 0.9 % 100 mL IVPB     1 g 200 mL/hr over 30 Minutes Intravenous  Once 04/19/2019 1943 04/03/2019 2107      Best Practice/Protocols:  VTE Prophylaxis: Mechanical .  Consults: Treatment Team:  Leta Baptist, MD Kary Kos, MD    Studies:    Events:  Subjective:    Overnight Issues:   Objective:  Vital signs for last 24 hours: Temp:  [94.9 F (34.9 C)-100.1 F (37.8 C)] 100.1 F (37.8 C) (12/10 0729) Pulse Rate:  [58-97] 97 (12/10 0900) Resp:  [14-31] 25 (12/10 0900) BP: (101-180)/(43-100) 153/60 (12/10 0900) SpO2:  [83 %-100 %] 97 % (12/10 0900) Weight:  [68.5 kg-79.4 kg] 68.5 kg (12/09 2212)  Hemodynamic parameters for last 24 hours:    Intake/Output from previous day: 12/09 0701 - 12/10 0700 In: 4050.2 [I.V.:1350.2; IV Piggyback:2700] Out: 575 [Urine:575]  Intake/Output this shift: Total I/O In: 363.8 [I.V.:363.8] Out: -   Vent settings for last 24 hours:    Physical Exam:  Facial edema and red drainage from nose Lungs rhonchi CV RRR Abd soft, NT Neuro WD to pain, eyes swollen shut, not f/c Calves  soft  Results for orders placed or performed during the hospital encounter of 04/03/2019 (from the past 24 hour(s))  Ethanol     Status: None   Collection Time: 04/05/2019  5:57 PM  Result Value Ref Range   Alcohol, Ethyl (B) <10 <10 mg/dL  Lactic acid, plasma     Status: Abnormal   Collection Time: 03/21/2019  5:57 PM  Result Value Ref Range   Lactic Acid, Venous 5.7 (HH) 0.5 - 1.9 mmol/L  Sample to Blood Bank     Status: None   Collection Time: 03/21/2019  5:57 PM  Result Value Ref Range   Blood Bank Specimen SAMPLE AVAILABLE FOR TESTING    Sample Expiration      03-Apr-2019,2359 Performed at [facility], [address], [place], [place] [address]   CDS serology     Status: None   Collection Time: 03/20/2019  5:58 PM  Result Value Ref Range   CDS serology specimen      SPECIMEN WILL BE HELD FOR 14 DAYS IF TESTING IS REQUIRED  Comprehensive metabolic panel     Status: Abnormal   Collection Time: 03/24/2019  5:58 PM  Result Value Ref Range   Sodium 140 135 - 145 mmol/L   Potassium 3.8 3.5 - 5.1 mmol/L   Chloride 107 98 - 111 mmol/L   CO2 18 (L) 22 - 32 mmol/L   Glucose, Bld 183 (H) 70 - 99 mg/dL  BUN 18 8 - 23 mg/dL   Creatinine, Ser 0.88 0.44 - 1.00 mg/dL   Calcium 9.3 8.9 - 10.3 mg/dL   Total Protein 6.6 6.5 - 8.1 g/dL   Albumin 3.9 3.5 - 5.0 g/dL   AST 39 15 - 41 U/L   ALT 25 0 - 44 U/L   Alkaline Phosphatase 69 38 - 126 U/L   Total Bilirubin 0.7 0.3 - 1.2 mg/dL   GFR calc non Af Amer 59 (L) >60 mL/min   GFR calc Af Amer >60 >60 mL/min   Anion gap 15 5 - 15  CBC     Status: Abnormal   Collection Time: 03/24/2019  5:58 PM  Result Value Ref Range   WBC 27.6 (H) 4.0 - 10.5 K/uL   RBC 3.93 3.87 - 5.11 MIL/uL   Hemoglobin 13.2 12.0 - 15.0 g/dL   HCT 40.7 36.0 - 46.0 %   MCV 103.6 (H) 80.0 - 100.0 fL   MCH 33.6 26.0 - 34.0 pg   MCHC 32.4 30.0 - 36.0 g/dL   RDW 12.2 11.5 - 15.5 %   Platelets 190 150 - 400 K/uL   nRBC 0.0 0.0 - 0.2 %  Protime-INR     Status: None    Collection Time: 04/19/2019  5:58 PM  Result Value Ref Range   Prothrombin Time 14.6 11.4 - 15.2 seconds   INR 1.2 0.8 - 1.2  Troponin I (High Sensitivity)     Status: None   Collection Time: 04/09/2019  5:58 PM  Result Value Ref Range   Troponin I (High Sensitivity) 10 <18 ng/L  CK     Status: None   Collection Time: 03/30/2019  5:58 PM  Result Value Ref Range   Total CK 220 38 - 234 U/L  CBC with Differential/Platelet     Status: Abnormal   Collection Time: 04/08/2019  5:58 PM  Result Value Ref Range   WBC 27.6 (H) 4.0 - 10.5 K/uL   RBC 4.02 3.87 - 5.11 MIL/uL   Hemoglobin 13.1 12.0 - 15.0 g/dL   HCT 41.6 36.0 - 46.0 %   MCV 103.5 (H) 80.0 - 100.0 fL   MCH 32.6 26.0 - 34.0 pg   MCHC 31.5 30.0 - 36.0 g/dL   RDW 12.3 11.5 - 15.5 %   Platelets 200 150 - 400 K/uL   nRBC 0.0 0.0 - 0.2 %   Neutrophils Relative % 81 %   Neutro Abs 22.4 (H) 1.7 - 7.7 K/uL   Lymphocytes Relative 14 %   Lymphs Abs 3.9 0.7 - 4.0 K/uL   Monocytes Relative 5 %   Monocytes Absolute 1.4 (H) 0.1 - 1.0 K/uL   Eosinophils Relative 0 %   Eosinophils Absolute 0.0 0.0 - 0.5 K/uL   Basophils Relative 0 %   Basophils Absolute 0.0 0.0 - 0.1 K/uL   nRBC 0 0 /100 WBC   Abs Immature Granulocytes 0.00 0.00 - 0.07 K/uL  I-stat chem 8, ED     Status: Abnormal   Collection Time: 04/08/2019  6:06 PM  Result Value Ref Range   Sodium 141 135 - 145 mmol/L   Potassium 3.7 3.5 - 5.1 mmol/L   Chloride 106 98 - 111 mmol/L   BUN 21 8 - 23 mg/dL   Creatinine, Ser 0.80 0.44 - 1.00 mg/dL   Glucose, Bld 180 (H) 70 - 99 mg/dL   Calcium, Ion 1.16 1.15 - 1.40 mmol/L   TCO2 23 22 - 32 mmol/L  Hemoglobin 13.9 12.0 - 15.0 g/dL   HCT 41.0 36.0 - 46.0 %  TSH     Status: Abnormal   Collection Time: 03/31/2019  6:48 PM  Result Value Ref Range   TSH 6.059 (H) 0.350 - 4.500 uIU/mL  T4, free     Status: Abnormal   Collection Time: 04/19/2019  7:13 PM  Result Value Ref Range   Free T4 1.79 (H) 0.61 - 1.12 ng/dL  POC SARS Coronavirus 2 Ag-ED -  Nasal Swab (BD Veritor Kit)     Status: None   Collection Time: 03/21/2019  7:19 PM  Result Value Ref Range   SARS Coronavirus 2 Ag NEGATIVE NEGATIVE  Urinalysis, Routine w reflex microscopic     Status: None   Collection Time: 04/14/2019  7:35 PM  Result Value Ref Range   Color, Urine YELLOW YELLOW   APPearance CLEAR CLEAR   Specific Gravity, Urine 1.015 1.005 - 1.030   pH 5.0 5.0 - 8.0   Glucose, UA NEGATIVE NEGATIVE mg/dL   Hgb urine dipstick NEGATIVE NEGATIVE   Bilirubin Urine NEGATIVE NEGATIVE   Ketones, ur NEGATIVE NEGATIVE mg/dL   Protein, ur NEGATIVE NEGATIVE mg/dL   Nitrite NEGATIVE NEGATIVE   Leukocytes,Ua NEGATIVE NEGATIVE  Troponin I (High Sensitivity)     Status: Abnormal   Collection Time: 03/26/2019  8:01 PM  Result Value Ref Range   Troponin I (High Sensitivity) 18 (H) <18 ng/L  I-STAT 7, (LYTES, BLD GAS, ICA, H+H)     Status: Abnormal   Collection Time: 2019/04/11  3:08 AM  Result Value Ref Range   pH, Arterial 7.276 (L) 7.350 - 7.450   pCO2 arterial 48.2 (H) 32.0 - 48.0 mmHg   pO2, Arterial 64.0 (L) 83.0 - 108.0 mmHg   Bicarbonate 22.4 20.0 - 28.0 mmol/L   TCO2 24 22 - 32 mmol/L   O2 Saturation 89.0 %   Acid-base deficit 4.0 (H) 0.0 - 2.0 mmol/L   Sodium 142 135 - 145 mmol/L   Potassium 3.6 3.5 - 5.1 mmol/L   Calcium, Ion 1.21 1.15 - 1.40 mmol/L   HCT 32.0 (L) 36.0 - 46.0 %   Hemoglobin 10.9 (L) 12.0 - 15.0 g/dL   Patient temperature 98.6 F    Collection site RADIAL, ALLEN'S TEST ACCEPTABLE    Drawn by Operator    Sample type ARTERIAL     Assessment & Plan: Present on Admission: . TBI (traumatic brain injury) ([place])    LOS: 1 day   Additional comments:I reviewed the patient's new clinical lab test results. and CT head Fall in garage  TBI with ICC R BG, SAH, small SDH, pneumocephalus - per Dr. Saintclair Halsted. Stable on F/U CT this AM. Not responsive since some ativan overnight. F/U exam, TBI team therapies as able Acute hypoxic respiratory failure - on NRB, I will  discuss with family their wishes regarding intubation Complex facial FXs including L LeForte type 1 and 3 - non-op for now per Dr. Benjamine Mola and I D/W him at the bedside HX HTN FEN - NPO due to neuro status, labs in AM, Na 141 Dispo - ICU, TBI team therapies if able. Plan goals of care discussion with family.  Critical Care Total Time*: 35 Minutes  Georganna Skeans, MD, MPH, FACS Trauma & General Surgery Use [email or web address] to contact on call provider  2019-04-11  *Care during the described time interval was provided by me. I have reviewed this patient's available data, including medical history, events of note, physical examination and  test results as part of my evaluation.

## 2019-04-20 NOTE — Progress Notes (Signed)
Subjective: Pt is lethargic. Not responsive to voice.  Objective: Vital signs in last 24 hours: Temp:  [94.9 F (34.9 C)-100.1 F (37.8 C)] 100.1 F (37.8 C) (12/10 0729) Pulse Rate:  [58-97] 97 (12/10 0900) Resp:  [14-31] 25 (12/10 0900) BP: (101-180)/(43-100) 153/60 (12/10 0900) SpO2:  [83 %-100 %] 97 % (12/10 0900) Weight:  [68.5 kg-79.4 kg] 68.5 kg (12/09 2212)  Physical Exam: General appearance: Moderate respiratory distress and pale. On O2 face mask. Head: Facial contusion, bilateral periorbital ecchymosis and edema. Eyes:[name] Extraocular motion cannot be tested. Ears: Examination of the ears shows normal auricles bilaterally. Blood in the left ear canal. Right EAC is normal. Nose: Nasal dorsum is edematous. Nasal mucosa also severely edematous with blood clots. Face: Facial examination shows edema and ecchymosis. No asymmetry. No significant stepoff. Mouth: Oral cavity examination shows no mucosal lacerations.  Neck:  Cervical collar in place. Neuro: Confused. Severely hard of hearing.  Recent Labs    04/08/2019 1758 03/20/2019 1806 04/02/19 0308  WBC 27.6*  27.6*  --   --   HGB 13.1  13.2 13.9 10.9*  HCT 41.6  40.7 41.0 32.0*  PLT 200  190  --   --    Recent Labs    04/13/2019 1758 04/05/2019 1806 04/02/19 0308  NA 140 141 142  K 3.8 3.7 3.6  CL 107 106  --   CO2 18*  --   --   GLUCOSE 183* 180*  --   BUN 18 21  --   CREATININE 0.88 0.80  --   CALCIUM 9.3  --   --     Medications:  I have reviewed the patient's current medications. Scheduled: . Chlorhexidine Gluconate Cloth  6 each Topical Daily   Continuous: . dextrose 5 % and 0.9% NaCl 75 mL/hr at 04/02/2019 0800  . niCARDipine 11 mg/hr (04/02/2019 0800)    Assessment/Plan: Complex facial fractures, including a left LeFort type 1 and type 3 fracture patterns and a right lateral orbital fracture. However, none of the fractures is significantly displaced. - In light of her advanced age and frail health,  will proceed with non-operative management. - Will re-evaluate once her facial swelling subsides. - Moderate respiratory distress. May need intubation. - Will follow.   LOS: 1 day   Jolene Guyett W Keilani Terrance 04/02/19, 10:03 AM

## 2019-04-20 NOTE — Consult Note (Signed)
Left prayer shawl with prayer for healing prayed for pt for pt and daughter with pt. Nurse will let daughter know.  Rev. Eloise Levels Chaplain

## 2019-04-20 NOTE — Consult Note (Signed)
Reason for Consult: Closed head injury Referring Physician: Trauma  [name] is an 86 y.o. female.  HPI: 86 year old female had a ground-level fall in the garage sustaining extensive head trauma with facial fractures.  Currently patient obtunded so unable to get history patient also sedated following Ativan last night and currently on a nonrebreather  History reviewed. No pertinent past medical history.  History reviewed. No pertinent surgical history.  No family history on file.  Social History:  has no history on file for tobacco, alcohol, and drug.  Allergies: Not on File  Medications: I have reviewed the patient's current medications.  Results for orders placed or performed during the hospital encounter of 04/13/2019 (from the past 48 hour(s))  Ethanol     Status: None   Collection Time: 03/23/2019  5:57 PM  Result Value Ref Range   Alcohol, Ethyl (B) <10 <10 mg/dL    Comment: (NOTE) Lowest detectable limit for serum alcohol is 10 mg/dL. For medical purposes only. Performed at [facility], [place] [address], [place], [place] [address]   Lactic acid, plasma     Status: Abnormal   Collection Time: 03/30/2019  5:57 PM  Result Value Ref Range   Lactic Acid, Venous 5.7 (HH) 0.5 - 1.9 mmol/L    Comment: CRITICAL RESULT CALLED TO, READ BACK BY AND VERIFIED WITH: C.MARSHALL,RN [phone] [id number] I.MANNING Performed at [facility], [place] [address], [place], [place] [address]   Sample to Blood Bank     Status: None   Collection Time: 04/11/2019  5:57 PM  Result Value Ref Range   Blood Bank Specimen SAMPLE AVAILABLE FOR TESTING    Sample Expiration      2019/04/06,2359 Performed at [facility], [place] [address], [place], [place] [address]   CDS serology     Status: None   Collection Time: 04/05/2019  5:58 PM  Result Value Ref Range   CDS serology specimen      SPECIMEN WILL BE HELD FOR 14 DAYS IF TESTING IS REQUIRED    Comment: Performed at [facility],  [place] [address] [place], [place] [address]  Comprehensive metabolic panel     Status: Abnormal   Collection Time: 04/02/2019  5:58 PM  Result Value Ref Range   Sodium 140 135 - 145 mmol/L   Potassium 3.8 3.5 - 5.1 mmol/L   Chloride 107 98 - 111 mmol/L   CO2 18 (L) 22 - 32 mmol/L   Glucose, Bld 183 (H) 70 - 99 mg/dL   BUN 18 8 - 23 mg/dL   Creatinine, Ser 0.88 0.44 - 1.00 mg/dL   Calcium 9.3 8.9 - 10.3 mg/dL   Total Protein 6.6 6.5 - 8.1 g/dL   Albumin 3.9 3.5 - 5.0 g/dL   AST 39 15 - 41 U/L   ALT 25 0 - 44 U/L   Alkaline Phosphatase 69 38 - 126 U/L   Total Bilirubin 0.7 0.3 - 1.2 mg/dL   GFR calc non Af Amer 59 (L) >60 mL/min   GFR calc Af Amer >60 >60 mL/min   Anion gap 15 5 - 15    Comment: Performed at [place] [address] [place] [address]  CBC     Status: Abnormal   Collection Time: 04/12/2019  5:58 PM  Result Value Ref Range   WBC 27.6 (H) 4.0 - 10.5 K/uL   RBC 3.93 3.87 - 5.11 MIL/uL   Hemoglobin 13.2 12.0 -  15.0 g/dL   HCT 40.7 36.0 - 46.0 %   MCV 103.6 (H) 80.0 - 100.0 fL   MCH 33.6 26.0 - 34.0 pg   MCHC 32.4 30.0 - 36.0 g/dL   RDW 12.2 11.5 - 15.5 %   Platelets 190 150 - 400 K/uL   nRBC 0.0 0.0 - 0.2 %    Comment: Performed at [facility], [place] [address] [place], [place] [address]  Protime-INR     Status: None   Collection Time: 04/07/2019  5:58 PM  Result Value Ref Range   Prothrombin Time 14.6 11.4 - 15.2 seconds   INR 1.2 0.8 - 1.2    Comment: (NOTE) INR goal varies based on device and disease states. Performed at [facility], [place] [address], [place], [place] [address]   Troponin I (High Sensitivity)     Status: None   Collection Time: 04/04/2019  5:58 PM  Result Value Ref Range   Troponin I (High Sensitivity) 10 <18 ng/L    Comment: (NOTE) Elevated high sensitivity troponin I (hsTnI) values and significant  changes across serial measurements may suggest ACS but many other  chronic and acute conditions are known to elevate  hsTnI results.  Refer to the "Links" section for chest pain algorithms and additional  guidance. Performed at [facility], [place] [address], [place], [place] [address]   CK     Status: None   Collection Time: 04/08/2019  5:58 PM  Result Value Ref Range   Total CK 220 38 - 234 U/L    Comment: Performed at [facility], [place] [address] [place], [place] [address]  CBC with Differential/Platelet     Status: Abnormal   Collection Time: 03/30/2019  5:58 PM  Result Value Ref Range   WBC 27.6 (H) 4.0 - 10.5 K/uL   RBC 4.02 3.87 - 5.11 MIL/uL   Hemoglobin 13.1 12.0 - 15.0 g/dL   HCT 41.6 36.0 - 46.0 %   MCV 103.5 (H) 80.0 - 100.0 fL   MCH 32.6 26.0 - 34.0 pg   MCHC 31.5 30.0 - 36.0 g/dL   RDW 12.3 11.5 - 15.5 %   Platelets 200 150 - 400 K/uL   nRBC 0.0 0.0 - 0.2 %   Neutrophils Relative % 81 %   Neutro Abs 22.4 (H) 1.7 - 7.7 K/uL   Lymphocytes Relative 14 %   Lymphs Abs 3.9 0.7 - 4.0 K/uL   Monocytes Relative 5 %   Monocytes Absolute 1.4 (H) 0.1 - 1.0 K/uL   Eosinophils Relative 0 %   Eosinophils Absolute 0.0 0.0 - 0.5 K/uL   Basophils Relative 0 %   Basophils Absolute 0.0 0.0 - 0.1 K/uL   nRBC 0 0 /100 WBC   Abs Immature Granulocytes 0.00 0.00 - 0.07 K/uL    Comment: Performed at [facility], [place] [address], [place], [place] [address]  I-stat chem 8, ED     Status: Abnormal   Collection Time: 04/16/2019  6:06 PM  Result Value Ref Range   Sodium 141 135 - 145 mmol/L   Potassium 3.7 3.5 - 5.1 mmol/L   Chloride 106 98 - 111 mmol/L   BUN 21 8 - 23 mg/dL   Creatinine, Ser 0.80 0.44 - 1.00 mg/dL   Glucose, Bld 180 (H) 70 - 99 mg/dL   Calcium, Ion 1.16 1.15 - 1.40 mmol/L   TCO2 23 22 - 32 mmol/L   Hemoglobin 13.9 12.0 -  15.0 g/dL   HCT 41.0 36.0 - 46.0 %  TSH     Status: Abnormal   Collection Time: 04/15/2019  6:48 PM  Result Value Ref Range   TSH 6.059 (H) 0.350 - 4.500 uIU/mL    Comment: Performed by a 3rd Generation assay with a functional sensitivity of <=0.01  uIU/mL. Performed at [facility], [place] [address] [place], [place] [address]   T4, free     Status: Abnormal   Collection Time: 04/11/2019  7:13 PM  Result Value Ref Range   Free T4 1.79 (H) 0.61 - 1.12 ng/dL    Comment: (NOTE) Biotin ingestion may interfere with free T4 tests. If the results are inconsistent with the TSH level, previous test results, or the clinical presentation, then consider biotin interference. If needed, order repeat testing after stopping biotin. Performed at [facility], [place] [address], [place], [place] [address]   POC SARS Coronavirus 2 Ag-ED - Nasal Swab (BD Veritor Kit)     Status: None   Collection Time: 03/20/2019  7:19 PM  Result Value Ref Range   SARS Coronavirus 2 Ag NEGATIVE NEGATIVE    Comment: (NOTE) SARS-CoV-2 antigen NOT DETECTED.  Negative results are presumptive.  Negative results do not preclude SARS-CoV-2 infection and should not be used as the sole basis for treatment or other patient management decisions, including infection  control decisions, particularly in the presence of clinical signs and  symptoms consistent with COVID-19, or in those who have been in contact with the virus.  Negative results must be combined with clinical observations, patient history, and epidemiological information. The expected result is Negative. Fact Sheet for Patients: PodPark.tn Fact Sheet for Healthcare Providers: GiftContent.is This test is not yet approved or cleared by the [place] FDA and  has been authorized for detection and/or diagnosis of SARS-CoV-2 by FDA under an Emergency Use Authorization (EUA).  This EUA will remain in effect (meaning this test can be used) for the duration of  the COVID-19 de claration under Section 564(b)(1) of the Act, 21 U.S.C. section 360bbb-3(b)(1), unless the authorization is terminated or revoked sooner.   Urinalysis, Routine w reflex  microscopic     Status: None   Collection Time: 04/11/2019  7:35 PM  Result Value Ref Range   Color, Urine YELLOW YELLOW   APPearance CLEAR CLEAR   Specific Gravity, Urine 1.015 1.005 - 1.030   pH 5.0 5.0 - 8.0   Glucose, UA NEGATIVE NEGATIVE mg/dL   Hgb urine dipstick NEGATIVE NEGATIVE   Bilirubin Urine NEGATIVE NEGATIVE   Ketones, ur NEGATIVE NEGATIVE mg/dL   Protein, ur NEGATIVE NEGATIVE mg/dL   Nitrite NEGATIVE NEGATIVE   Leukocytes,Ua NEGATIVE NEGATIVE    Comment: Performed at [facility] [address] [place], [place] [address]  Troponin I (High Sensitivity)     Status: Abnormal   Collection Time: 04/08/2019  8:01 PM  Result Value Ref Range   Troponin I (High Sensitivity) 18 (H) <18 ng/L    Comment: (NOTE) Elevated high sensitivity troponin I (hsTnI) values and significant  changes across serial measurements may suggest ACS but many other  chronic and acute conditions are known to elevate hsTnI results.  Refer to the "Links" section for chest pain algorithms and additional  guidance. Performed at [facility], [place] [address], [place], [place] [address]   I-STAT 7, (LYTES, BLD GAS, ICA, H+H)     Status: Abnormal   Collection Time: 04-13-2019  3:08 AM  Result Value Ref Range   pH, Arterial 7.276 (L) 7.350 - 7.450   pCO2 arterial 48.2 (H) 32.0 - 48.0 mmHg   pO2, Arterial 64.0 (L) 83.0 - 108.0 mmHg   Bicarbonate 22.4 20.0 - 28.0 mmol/L   TCO2 24 22 - 32 mmol/L   O2 Saturation 89.0 %   Acid-base deficit 4.0 (H) 0.0 - 2.0 mmol/L   Sodium 142 135 - 145 mmol/L   Potassium 3.6 3.5 - 5.1 mmol/L   Calcium, Ion 1.21 1.15 - 1.40 mmol/L   HCT 32.0 (L) 36.0 - 46.0 %   Hemoglobin 10.9 (L) 12.0 - 15.0 g/dL   Patient temperature 98.6 F    Collection site RADIAL, ALLEN'S TEST ACCEPTABLE    Drawn by Operator    Sample type ARTERIAL     CT Angio Head W or Wo Contrast  Result Date: 04/16/2019 CLINICAL DATA:  Fall with posttraumatic headache EXAM: CT ANGIOGRAPHY HEAD AND  NECK TECHNIQUE: Multidetector CT imaging of the head and neck was performed using the standard protocol during bolus administration of intravenous contrast. Multiplanar CT image reconstructions and MIPs were obtained to evaluate the vascular anatomy. Carotid stenosis measurements (when applicable) are obtained utilizing NASCET criteria, using the distal internal carotid diameter as the denominator. CONTRAST:  64m OMNIPAQUE IOHEXOL 350 MG/ML SOLN COMPARISON:  None. Head CT same day FINDINGS: CTA NECK FINDINGS SKELETON: Multiple facial fractures are detailed on the earlier maxillofacial CT. OTHER NECK: Normal pharynx, larynx and major salivary glands. No cervical lymphadenopathy. Unremarkable thyroid gland. UPPER CHEST: No pneumothorax or pleural effusion. No nodules or masses. AORTIC ARCH: There is moderate calcific atherosclerosis of the aortic arch. There is no aneurysm, dissection or hemodynamically significant stenosis of the visualized portion of the aorta. Conventional 3 vessel aortic branching pattern. The visualized proximal subclavian arteries are widely patent. RIGHT CAROTID SYSTEM: Normal without aneurysm, dissection or stenosis. LEFT CAROTID SYSTEM: Normal without aneurysm, dissection or stenosis. VERTEBRAL ARTERIES: Right dominant configuration. Both origins are clearly patent. There is no dissection, occlusion or flow-limiting stenosis to the skull base (V1-V3 segments). CTA HEAD FINDINGS POSTERIOR CIRCULATION: --Vertebral arteries: Normal V4 segments. --Posterior inferior cerebellar arteries (PICA): Patent origins from the vertebral arteries. --Anterior inferior cerebellar arteries (AICA): Patent origins from the basilar artery. --Basilar artery: Normal. --Superior cerebellar arteries: Normal. --Posterior cerebral arteries: Normal. The right PCA is predominantly supplied by the posterior communicating artery. ANTERIOR CIRCULATION: --Intracranial internal carotid arteries: No dissection or occlusion.  There is a short segment of dehiscence of the anteromedial right carotid canal at the level of the sphenoid sinus (series 7, image 127). There is mild calcific atherosclerosis without high-grade stenosis. --Anterior cerebral arteries (ACA): Normal. Both A1 segments are present. Patent anterior communicating artery (a-comm). --Middle cerebral arteries (MCA): Normal. VENOUS SINUSES: As permitted by contrast timing, patent. ANATOMIC VARIANTS: Fetal origin of the right posterior cerebral artery. Review of the MIP images confirms the above findings. IMPRESSION: 1. No intracranial arterial occlusion or high-grade stenosis. 2. No acute blunt cerebrovascular injury at the skull base. 3. Redemonstration of facial fractures and intracranial hemorrhage as described on earlier head CT. 4. Aortic Atherosclerosis (ICD10-I70.0). Electronically Signed   By: KUlyses JarredM.D.   On: 03/31/2019 21:50   CT Head Wo Contrast  Result Date: 03/22/2019 CLINICAL DATA:  Found on ground in garage in pool of blood, last known normal 10 a.m. EXAM: CT HEAD WITHOUT CONTRAST CT MAXILLOFACIAL WITHOUT CONTRAST CT CERVICAL SPINE WITHOUT CONTRAST TECHNIQUE: Multidetector CT imaging of  the head, cervical spine, and maxillofacial structures were performed using the standard protocol without intravenous contrast. Multiplanar CT image reconstructions of the cervical spine and maxillofacial structures were also generated. COMPARISON:  CT head 02/08/2019, MR 02/09/2019 FINDINGS: CT HEAD FINDINGS Brain: Parenchymal hemorrhage seen in the mesial right temporal lobe. Hemorrhagic parenchymal contusive changes are noted along the right frontal convexity and right temporal lobe. Additional multifocal sites of subarachnoid and subdural hemorrhage are noted along the falx and tentorium as well as layering within both sylvian fissures with some intraventricular hemorrhage noted in the left lateral ventricle. Additional hemorrhage seen in the quadrigeminal  cistern. Scattered pneumocephalus is seen throughout both hemispheres and within the posterior fossa. No midline shift is seen. Findings on a background of chronic microvascular angiopathy and parenchymal volume loss. Vascular: Atherosclerotic calcification of the carotid siphons and intradural vertebral arteries. No hyperdense vessel. Several foci of gas are seen within the cavernous sinuses. Skull: Extensive left frontotemporal scalp swelling and contusion with large crescentic hematoma measuring up to 9 mm in maximal thickness. Additional swelling extends into the frontal scalp and supraorbital tissues. Other: Patient motion artifact may limit detection of subtle, nondisplaced skull base and calvarial fractures. CT MAXILLOFACIAL FINDINGS Osseous: There is a comminuted fracture of the left pterygoid plates. Complex facial bone fractures include a left LeFort type 1 pattern with fractures through the medial and lateral walls maxillary sinus extending into the alveolar ridge likely involving the nasal septum. There is additional left LeFort type 3 injury with involving fractures through the medial and lateral walls of the left orbit, left zygomatic arch and left nasofrontal suture. Additional fractures are seen through the posterior nasal septum as well as the through the lateral, posterior and superior walls of the bilateral sphenoid sinuses and sphenoid sinus septum. Fractures extend in close proximity to both the optic and carotid canals likely providing the site of ingress for the intracranial gas detailed above. Additional fracture through the right lateral orbit into the zygomatic process of the right maxilla. Patient is largely edentulous. The mandible appears to be intact temporomandibular joints are normally aligned. Orbits: There is extraconal retroseptal gas and hemorrhage seen in the left orbit. Some thickening of the lateral rectus is noted as it comes in close proximity to the lateral orbital fracture.  Small amount of hemorrhage is seen in the lateral floor of the right orbit as well without retro septal gas. The globes appear normal and symmetric. Symmetric appearance of the optic nerve sheath complexes. Normal caliber of the superior ophthalmic veins. Sinuses: There is extensive hyperdense hemosinus with pneumatized secretions in air-fluid levels throughout the left maxillary and sphenoid sinuses as well as near complete opacification of the ethmoids, left greater than right. Mastoid air cells remain well aerated. Middle ear cavities are clear. There is nonspecific soft tissue occlusion of the left external auditory canal. Ossicular chains appear normally configured. Soft tissues: There is extensive left frontal and periorbital soft tissue swelling extending across the eyelids to the medial nasal bridge and into the may large soft tissues inferiorly. Posteriorly this extends in continuity with the large left frontal scalp contusion and probable laceration with few punctate foci of gas. There is milder right periorbital soft tissue swelling and hematoma. Focal left pre mandibular soft tissue swelling and stranding is noted as well. CT CERVICAL SPINE FINDINGS Alignment: Reversal of the normal cervical lordosis likely on a degenerative basis. No traumatic listhesis or abnormal facet widening is seen. Craniocervical atlantoaxial articulation is maintained. Skull base  and vertebrae: Motion artifact may limit detection of subtle, nondisplaced fractures. No definite acute fracture or suspicious osseous lesion of the cervical spine is seen. Soft tissues and spinal canal: No pre or paravertebral fluid or swelling. No visible canal hematoma. Disc levels: Multilevel diffuse intervertebral disc height loss is noted of the cervical spine most pronounced from C5-C7. No severe canal stenosis or foraminal narrowing. Upper chest: Emphysematous changes noted in the lung apices. Other: Cervical carotid atherosclerosis. Diminutive  appearance of the thyroid. IMPRESSION: 1. Hemorrhagic parenchymal contusive changes along the right frontal convexity and right temporal lobe. Additional multifocal sites of subarachnoid and subdural hemorrhage are seen along the falx, tentorium, and within the sylvian fissures. Hemorrhage layering within the left lateral ventricle. Additional hemorrhage seen in the quadrigeminal cistern. Convincing features of active or pending herniation at this time. 2. Scattered pneumocephalus is seen throughout both hemispheres and within the posterior fossa. 3. Complex facial bone fractures as detailed above, including a left LeFort type 1 and type 3 fracture patterns. A right lateral orbital fracture extending into the cycle medic process of the maxilla. 4. Additional fractures of the posterior nasal septum and comminuted fractures of the sphenoid sinuses fractures extend in close proximity to both the optic and carotid canals likely providing the site of ingress for the intracranial gas detailed above. Consider further evaluation with CT angiography of the head and neck to assess for potential vascular injury. 5. Extensive associated hemosinus. 6. Extensive left frontal scalp, left periorbital and malar soft tissue swelling and contusion with large crescentic hematoma measuring up to 9 mm in maximal thickness. Left pre mandibular soft tissue swelling is noted as well. 7. Second separate site of contusion and hematoma in the right periorbital soft tissues as well. 8. No evidence of acute traumatic injury to the cervical spine though evaluation is somewhat limited by motion artifact. 9. Multilevel degenerative changes of the cervical spine are detailed above. 10. Nonspecific soft tissue occlusion of the left external auditory canal. Correlation with direct visualization is recommended. These results were called by telephone at the time of interpretation on 04/03/2019 at 7:57 pm to provider Dr. Langston Masker, who verbally acknowledged  these results. Electronically Signed   By: Lovena Le M.D.   On: 03/23/2019 19:59   CT Angio Neck W and/or Wo Contrast  Result Date: 03/20/2019 CLINICAL DATA:  Fall with posttraumatic headache EXAM: CT ANGIOGRAPHY HEAD AND NECK TECHNIQUE: Multidetector CT imaging of the head and neck was performed using the standard protocol during bolus administration of intravenous contrast. Multiplanar CT image reconstructions and MIPs were obtained to evaluate the vascular anatomy. Carotid stenosis measurements (when applicable) are obtained utilizing NASCET criteria, using the distal internal carotid diameter as the denominator. CONTRAST:  37m OMNIPAQUE IOHEXOL 350 MG/ML SOLN COMPARISON:  None. Head CT same day FINDINGS: CTA NECK FINDINGS SKELETON: Multiple facial fractures are detailed on the earlier maxillofacial CT. OTHER NECK: Normal pharynx, larynx and major salivary glands. No cervical lymphadenopathy. Unremarkable thyroid gland. UPPER CHEST: No pneumothorax or pleural effusion. No nodules or masses. AORTIC ARCH: There is moderate calcific atherosclerosis of the aortic arch. There is no aneurysm, dissection or hemodynamically significant stenosis of the visualized portion of the aorta. Conventional 3 vessel aortic branching pattern. The visualized proximal subclavian arteries are widely patent. RIGHT CAROTID SYSTEM: Normal without aneurysm, dissection or stenosis. LEFT CAROTID SYSTEM: Normal without aneurysm, dissection or stenosis. VERTEBRAL ARTERIES: Right dominant configuration. Both origins are clearly patent. There is no dissection, occlusion or flow-limiting stenosis  to the skull base (V1-V3 segments). CTA HEAD FINDINGS POSTERIOR CIRCULATION: --Vertebral arteries: Normal V4 segments. --Posterior inferior cerebellar arteries (PICA): Patent origins from the vertebral arteries. --Anterior inferior cerebellar arteries (AICA): Patent origins from the basilar artery. --Basilar artery: Normal. --Superior cerebellar  arteries: Normal. --Posterior cerebral arteries: Normal. The right PCA is predominantly supplied by the posterior communicating artery. ANTERIOR CIRCULATION: --Intracranial internal carotid arteries: No dissection or occlusion. There is a short segment of dehiscence of the anteromedial right carotid canal at the level of the sphenoid sinus (series 7, image 127). There is mild calcific atherosclerosis without high-grade stenosis. --Anterior cerebral arteries (ACA): Normal. Both A1 segments are present. Patent anterior communicating artery (a-comm). --Middle cerebral arteries (MCA): Normal. VENOUS SINUSES: As permitted by contrast timing, patent. ANATOMIC VARIANTS: Fetal origin of the right posterior cerebral artery. Review of the MIP images confirms the above findings. IMPRESSION: 1. No intracranial arterial occlusion or high-grade stenosis. 2. No acute blunt cerebrovascular injury at the skull base. 3. Redemonstration of facial fractures and intracranial hemorrhage as described on earlier head CT. 4. Aortic Atherosclerosis (ICD10-I70.0). Electronically Signed   By: Ulyses Jarred M.D.   On: 03/24/2019 21:50   CT Cervical Spine Wo Contrast  Result Date: 03/25/2019 CLINICAL DATA:  Found on ground in garage in pool of blood, last known normal 10 a.m. EXAM: CT HEAD WITHOUT CONTRAST CT MAXILLOFACIAL WITHOUT CONTRAST CT CERVICAL SPINE WITHOUT CONTRAST TECHNIQUE: Multidetector CT imaging of the head, cervical spine, and maxillofacial structures were performed using the standard protocol without intravenous contrast. Multiplanar CT image reconstructions of the cervical spine and maxillofacial structures were also generated. COMPARISON:  CT head 02/08/2019, MR 02/09/2019 FINDINGS: CT HEAD FINDINGS Brain: Parenchymal hemorrhage seen in the mesial right temporal lobe. Hemorrhagic parenchymal contusive changes are noted along the right frontal convexity and right temporal lobe. Additional multifocal sites of subarachnoid and  subdural hemorrhage are noted along the falx and tentorium as well as layering within both sylvian fissures with some intraventricular hemorrhage noted in the left lateral ventricle. Additional hemorrhage seen in the quadrigeminal cistern. Scattered pneumocephalus is seen throughout both hemispheres and within the posterior fossa. No midline shift is seen. Findings on a background of chronic microvascular angiopathy and parenchymal volume loss. Vascular: Atherosclerotic calcification of the carotid siphons and intradural vertebral arteries. No hyperdense vessel. Several foci of gas are seen within the cavernous sinuses. Skull: Extensive left frontotemporal scalp swelling and contusion with large crescentic hematoma measuring up to 9 mm in maximal thickness. Additional swelling extends into the frontal scalp and supraorbital tissues. Other: Patient motion artifact may limit detection of subtle, nondisplaced skull base and calvarial fractures. CT MAXILLOFACIAL FINDINGS Osseous: There is a comminuted fracture of the left pterygoid plates. Complex facial bone fractures include a left LeFort type 1 pattern with fractures through the medial and lateral walls maxillary sinus extending into the alveolar ridge likely involving the nasal septum. There is additional left LeFort type 3 injury with involving fractures through the medial and lateral walls of the left orbit, left zygomatic arch and left nasofrontal suture. Additional fractures are seen through the posterior nasal septum as well as the through the lateral, posterior and superior walls of the bilateral sphenoid sinuses and sphenoid sinus septum. Fractures extend in close proximity to both the optic and carotid canals likely providing the site of ingress for the intracranial gas detailed above. Additional fracture through the right lateral orbit into the zygomatic process of the right maxilla. Patient is largely edentulous. The mandible  appears to be intact  temporomandibular joints are normally aligned. Orbits: There is extraconal retroseptal gas and hemorrhage seen in the left orbit. Some thickening of the lateral rectus is noted as it comes in close proximity to the lateral orbital fracture. Small amount of hemorrhage is seen in the lateral floor of the right orbit as well without retro septal gas. The globes appear normal and symmetric. Symmetric appearance of the optic nerve sheath complexes. Normal caliber of the superior ophthalmic veins. Sinuses: There is extensive hyperdense hemosinus with pneumatized secretions in air-fluid levels throughout the left maxillary and sphenoid sinuses as well as near complete opacification of the ethmoids, left greater than right. Mastoid air cells remain well aerated. Middle ear cavities are clear. There is nonspecific soft tissue occlusion of the left external auditory canal. Ossicular chains appear normally configured. Soft tissues: There is extensive left frontal and periorbital soft tissue swelling extending across the eyelids to the medial nasal bridge and into the may large soft tissues inferiorly. Posteriorly this extends in continuity with the large left frontal scalp contusion and probable laceration with few punctate foci of gas. There is milder right periorbital soft tissue swelling and hematoma. Focal left pre mandibular soft tissue swelling and stranding is noted as well. CT CERVICAL SPINE FINDINGS Alignment: Reversal of the normal cervical lordosis likely on a degenerative basis. No traumatic listhesis or abnormal facet widening is seen. Craniocervical atlantoaxial articulation is maintained. Skull base and vertebrae: Motion artifact may limit detection of subtle, nondisplaced fractures. No definite acute fracture or suspicious osseous lesion of the cervical spine is seen. Soft tissues and spinal canal: No pre or paravertebral fluid or swelling. No visible canal hematoma. Disc levels: Multilevel diffuse  intervertebral disc height loss is noted of the cervical spine most pronounced from C5-C7. No severe canal stenosis or foraminal narrowing. Upper chest: Emphysematous changes noted in the lung apices. Other: Cervical carotid atherosclerosis. Diminutive appearance of the thyroid. IMPRESSION: 1. Hemorrhagic parenchymal contusive changes along the right frontal convexity and right temporal lobe. Additional multifocal sites of subarachnoid and subdural hemorrhage are seen along the falx, tentorium, and within the sylvian fissures. Hemorrhage layering within the left lateral ventricle. Additional hemorrhage seen in the quadrigeminal cistern. Convincing features of active or pending herniation at this time. 2. Scattered pneumocephalus is seen throughout both hemispheres and within the posterior fossa. 3. Complex facial bone fractures as detailed above, including a left LeFort type 1 and type 3 fracture patterns. A right lateral orbital fracture extending into the cycle medic process of the maxilla. 4. Additional fractures of the posterior nasal septum and comminuted fractures of the sphenoid sinuses fractures extend in close proximity to both the optic and carotid canals likely providing the site of ingress for the intracranial gas detailed above. Consider further evaluation with CT angiography of the head and neck to assess for potential vascular injury. 5. Extensive associated hemosinus. 6. Extensive left frontal scalp, left periorbital and malar soft tissue swelling and contusion with large crescentic hematoma measuring up to 9 mm in maximal thickness. Left pre mandibular soft tissue swelling is noted as well. 7. Second separate site of contusion and hematoma in the right periorbital soft tissues as well. 8. No evidence of acute traumatic injury to the cervical spine though evaluation is somewhat limited by motion artifact. 9. Multilevel degenerative changes of the cervical spine are detailed above. 10. Nonspecific  soft tissue occlusion of the left external auditory canal. Correlation with direct visualization is recommended. These results were  called by telephone at the time of interpretation on 04/09/2019 at 7:57 pm to provider Dr. Langston Masker, who verbally acknowledged these results. Electronically Signed   By: Lovena Le M.D.   On: 04/15/2019 19:59   DG Pelvis Portable  Result Date: 04/15/2019 CLINICAL DATA:  Fall, level 2 trauma EXAM: PORTABLE PELVIS 1-2 VIEWS COMPARISON:  None. FINDINGS: The osseous structures appear diffusely demineralized which may limit detection of small or nondisplaced fractures. Bones of the pelvis appear intact and congruent. Femoral heads remain normally located. Proximal femora are intact. Extensive degenerative changes noted in the lower lumbar spine and lumbosacral junction. Moderate degenerative changes in the hips. Normal nonobstructive bowel gas pattern. Atherosclerotic calcifications of vascular stenting noted in the low abdomen. Remaining soft tissues are unremarkable. IMPRESSION: 1. No acute fracture or dislocation identified. 2. Diffuse osseous demineralization which may limit detection of small or nondisplaced fractures. 3. Degenerative changes in the lower lumbar spine and lumbosacral junction as well as both hips. Electronically Signed   By: Lovena Le M.D.   On: 03/21/2019 18:33   DG Chest Port 1 View  Result Date: 03/31/2019 CLINICAL DATA:  Fall, level 2 trauma EXAM: PORTABLE CHEST 1 VIEW COMPARISON:  Radiograph 01/19/2019, CTA chest 04/09/2018 FINDINGS: No consolidation, features of edema, pneumothorax, or effusion. The aorta is calcified. The remaining cardiomediastinal contours are unremarkable. Abundant pericardial fat result in increased attenuation in the left lung base corresponding well to CT findings from 2019. No acute osseous or soft tissue abnormality. Dextrocurvature of the upper thoracic spine is noted. Degenerative changes are present in the imaged spine and  shoulders. Remote left lateral third rib fracture. IMPRESSION: 1. No acute cardiopulmonary or traumatic finding in the chest. 2. Abundant pericardial fat resulting in increased attenuation in the left lung base, corresponding to CT findings from 2019. 3.  Aortic Atherosclerosis (ICD10-I70.0). Electronically Signed   By: Lovena Le M.D.   On: 03/21/2019 18:24   CT Maxillofacial Wo Contrast  Result Date: 04/13/2019 CLINICAL DATA:  Found on ground in garage in pool of blood, last known normal 10 a.m. EXAM: CT HEAD WITHOUT CONTRAST CT MAXILLOFACIAL WITHOUT CONTRAST CT CERVICAL SPINE WITHOUT CONTRAST TECHNIQUE: Multidetector CT imaging of the head, cervical spine, and maxillofacial structures were performed using the standard protocol without intravenous contrast. Multiplanar CT image reconstructions of the cervical spine and maxillofacial structures were also generated. COMPARISON:  CT head 02/08/2019, MR 02/09/2019 FINDINGS: CT HEAD FINDINGS Brain: Parenchymal hemorrhage seen in the mesial right temporal lobe. Hemorrhagic parenchymal contusive changes are noted along the right frontal convexity and right temporal lobe. Additional multifocal sites of subarachnoid and subdural hemorrhage are noted along the falx and tentorium as well as layering within both sylvian fissures with some intraventricular hemorrhage noted in the left lateral ventricle. Additional hemorrhage seen in the quadrigeminal cistern. Scattered pneumocephalus is seen throughout both hemispheres and within the posterior fossa. No midline shift is seen. Findings on a background of chronic microvascular angiopathy and parenchymal volume loss. Vascular: Atherosclerotic calcification of the carotid siphons and intradural vertebral arteries. No hyperdense vessel. Several foci of gas are seen within the cavernous sinuses. Skull: Extensive left frontotemporal scalp swelling and contusion with large crescentic hematoma measuring up to 9 mm in maximal  thickness. Additional swelling extends into the frontal scalp and supraorbital tissues. Other: Patient motion artifact may limit detection of subtle, nondisplaced skull base and calvarial fractures. CT MAXILLOFACIAL FINDINGS Osseous: There is a comminuted fracture of the left pterygoid plates. Complex facial bone fractures  include a left LeFort type 1 pattern with fractures through the medial and lateral walls maxillary sinus extending into the alveolar ridge likely involving the nasal septum. There is additional left LeFort type 3 injury with involving fractures through the medial and lateral walls of the left orbit, left zygomatic arch and left nasofrontal suture. Additional fractures are seen through the posterior nasal septum as well as the through the lateral, posterior and superior walls of the bilateral sphenoid sinuses and sphenoid sinus septum. Fractures extend in close proximity to both the optic and carotid canals likely providing the site of ingress for the intracranial gas detailed above. Additional fracture through the right lateral orbit into the zygomatic process of the right maxilla. Patient is largely edentulous. The mandible appears to be intact temporomandibular joints are normally aligned. Orbits: There is extraconal retroseptal gas and hemorrhage seen in the left orbit. Some thickening of the lateral rectus is noted as it comes in close proximity to the lateral orbital fracture. Small amount of hemorrhage is seen in the lateral floor of the right orbit as well without retro septal gas. The globes appear normal and symmetric. Symmetric appearance of the optic nerve sheath complexes. Normal caliber of the superior ophthalmic veins. Sinuses: There is extensive hyperdense hemosinus with pneumatized secretions in air-fluid levels throughout the left maxillary and sphenoid sinuses as well as near complete opacification of the ethmoids, left greater than right. Mastoid air cells remain well aerated.  Middle ear cavities are clear. There is nonspecific soft tissue occlusion of the left external auditory canal. Ossicular chains appear normally configured. Soft tissues: There is extensive left frontal and periorbital soft tissue swelling extending across the eyelids to the medial nasal bridge and into the may large soft tissues inferiorly. Posteriorly this extends in continuity with the large left frontal scalp contusion and probable laceration with few punctate foci of gas. There is milder right periorbital soft tissue swelling and hematoma. Focal left pre mandibular soft tissue swelling and stranding is noted as well. CT CERVICAL SPINE FINDINGS Alignment: Reversal of the normal cervical lordosis likely on a degenerative basis. No traumatic listhesis or abnormal facet widening is seen. Craniocervical atlantoaxial articulation is maintained. Skull base and vertebrae: Motion artifact may limit detection of subtle, nondisplaced fractures. No definite acute fracture or suspicious osseous lesion of the cervical spine is seen. Soft tissues and spinal canal: No pre or paravertebral fluid or swelling. No visible canal hematoma. Disc levels: Multilevel diffuse intervertebral disc height loss is noted of the cervical spine most pronounced from C5-C7. No severe canal stenosis or foraminal narrowing. Upper chest: Emphysematous changes noted in the lung apices. Other: Cervical carotid atherosclerosis. Diminutive appearance of the thyroid. IMPRESSION: 1. Hemorrhagic parenchymal contusive changes along the right frontal convexity and right temporal lobe. Additional multifocal sites of subarachnoid and subdural hemorrhage are seen along the falx, tentorium, and within the sylvian fissures. Hemorrhage layering within the left lateral ventricle. Additional hemorrhage seen in the quadrigeminal cistern. Convincing features of active or pending herniation at this time. 2. Scattered pneumocephalus is seen throughout both hemispheres  and within the posterior fossa. 3. Complex facial bone fractures as detailed above, including a left LeFort type 1 and type 3 fracture patterns. A right lateral orbital fracture extending into the cycle medic process of the maxilla. 4. Additional fractures of the posterior nasal septum and comminuted fractures of the sphenoid sinuses fractures extend in close proximity to both the optic and carotid canals likely providing the site of ingress for  the intracranial gas detailed above. Consider further evaluation with CT angiography of the head and neck to assess for potential vascular injury. 5. Extensive associated hemosinus. 6. Extensive left frontal scalp, left periorbital and malar soft tissue swelling and contusion with large crescentic hematoma measuring up to 9 mm in maximal thickness. Left pre mandibular soft tissue swelling is noted as well. 7. Second separate site of contusion and hematoma in the right periorbital soft tissues as well. 8. No evidence of acute traumatic injury to the cervical spine though evaluation is somewhat limited by motion artifact. 9. Multilevel degenerative changes of the cervical spine are detailed above. 10. Nonspecific soft tissue occlusion of the left external auditory canal. Correlation with direct visualization is recommended. These results were called by telephone at the time of interpretation on 04/02/2019 at 7:57 pm to provider Dr. Langston Masker, who verbally acknowledged these results. Electronically Signed   By: Lovena Le M.D.   On: 03/24/2019 19:59    Review of Systems  Unable to perform ROS: Acuity of condition   Blood pressure 106/66, pulse 95, temperature 98.2 F (36.8 C), temperature source Oral, resp. rate (!) 25, height _0  (1.626 m), weight 68.5 kg, SpO2 97 %. Physical Exam  Constitutional: She appears lethargic.  Neurological: She appears lethargic. GCS eye subscore is 4. GCS verbal subscore is 5. GCS motor subscore is 6.  Pupils are equal and reactive  extensive periorbital edema and ecchymosis.  Conjunctival hemorrhage patient will open eyes to voice will stick out her tongue to command moves all extremities spontaneously and purposefully but not to command.    Assessment/Plan: Patient does awaken to stand but very lethargic pupils equal otherwise nonfocal extensive amount of facial trauma periorbital edema conjunctival hemorrhage awaiting follow-up CT scan of her head original injury showed extensive amount of subarachnoid hemorrhage and pneumocephalus but nothing with any significant mass-effect.  Ramon Zanders P 06-Apr-2019, 7:34 AM

## 2019-04-20 NOTE — Progress Notes (Signed)
Patients dentures sent with patient to morgue. Lianne Bushy RN BSN.

## 2019-04-20 NOTE — Progress Notes (Signed)
Patient daughter [name] called and updated on patient condition and made aware of the visitor policy with all questions answered.

## 2019-04-20 NOTE — Death Summary Note (Signed)
DEATH SUMMARY   Patient Details  Name: [name] MRN: [id number] DOB: 02-Jun-1929  Admission/Discharge Information   Admit Date:  2019/04/13  Date of Death: Date of Death: 04/14/2019  Time of Death: Time of Death: 26  Length of Stay: 1  Referring Physician: Chauncey Cruel, MD   Reason(s) for Hospitalization  fall  Diagnoses  Preliminary cause of death:  Secondary Diagnoses (including complications and co-morbidities):  Active Problems:   TBI (traumatic brain injury) [facility])   Brief Hospital Course (including significant findings, care, treatment, and services provided and events leading to death)  [name] is a 86 y.o. year old female who was brought in as a level 2 trauma after being found down in her garage.  She had fallen but this was unwitnessed.  She was found down by her daughter.  She had significant facial bruising.  She was evaluated by the EDP and found to have pneumocephalus, small mount of subarachnoid blood, a LeFort I facial fracture.  EDP consulted neurosurgery and ENT.  Trauma was asked to admit patient.    She was admitted to the ICU.  Consultants recommended observation.  She had a decline in mental status overnight.  Follow-up head CT was essentially stable, however, she was unresponsive.  Her daughter arrived and reported that she had a living well and would definitely not want to be intubated and would want to be DNR in this condition.  Family was allowed to visit and she continued to decline from a respiratory status as well.  They transitioned to comfort care and she passed quickly.    Pertinent Labs and Studies  Significant Diagnostic Studies CT Angio Head W or Wo Contrast  Result Date: 04/13/19 CLINICAL DATA:  Fall with posttraumatic headache EXAM: CT ANGIOGRAPHY HEAD AND NECK TECHNIQUE: Multidetector CT imaging of the head and neck was performed using the standard protocol during bolus administration of intravenous contrast. Multiplanar CT  image reconstructions and MIPs were obtained to evaluate the vascular anatomy. Carotid stenosis measurements (when applicable) are obtained utilizing NASCET criteria, using the distal internal carotid diameter as the denominator. CONTRAST:  25m OMNIPAQUE IOHEXOL 350 MG/ML SOLN COMPARISON:  None. Head CT same day FINDINGS: CTA NECK FINDINGS SKELETON: Multiple facial fractures are detailed on the earlier maxillofacial CT. OTHER NECK: Normal pharynx, larynx and major salivary glands. No cervical lymphadenopathy. Unremarkable thyroid gland. UPPER CHEST: No pneumothorax or pleural effusion. No nodules or masses. AORTIC ARCH: There is moderate calcific atherosclerosis of the aortic arch. There is no aneurysm, dissection or hemodynamically significant stenosis of the visualized portion of the aorta. Conventional 3 vessel aortic branching pattern. The visualized proximal subclavian arteries are widely patent. RIGHT CAROTID SYSTEM: Normal without aneurysm, dissection or stenosis. LEFT CAROTID SYSTEM: Normal without aneurysm, dissection or stenosis. VERTEBRAL ARTERIES: Right dominant configuration. Both origins are clearly patent. There is no dissection, occlusion or flow-limiting stenosis to the skull base (V1-V3 segments). CTA HEAD FINDINGS POSTERIOR CIRCULATION: --Vertebral arteries: Normal V4 segments. --Posterior inferior cerebellar arteries (PICA): Patent origins from the vertebral arteries. --Anterior inferior cerebellar arteries (AICA): Patent origins from the basilar artery. --Basilar artery: Normal. --Superior cerebellar arteries: Normal. --Posterior cerebral arteries: Normal. The right PCA is predominantly supplied by the posterior communicating artery. ANTERIOR CIRCULATION: --Intracranial internal carotid arteries: No dissection or occlusion. There is a short segment of dehiscence of the anteromedial right carotid canal at the level of the sphenoid sinus (series 7, image 127). There is mild calcific  atherosclerosis without high-grade stenosis. --Anterior  cerebral arteries (ACA): Normal. Both A1 segments are present. Patent anterior communicating artery (a-comm). --Middle cerebral arteries (MCA): Normal. VENOUS SINUSES: As permitted by contrast timing, patent. ANATOMIC VARIANTS: Fetal origin of the right posterior cerebral artery. Review of the MIP images confirms the above findings. IMPRESSION: 1. No intracranial arterial occlusion or high-grade stenosis. 2. No acute blunt cerebrovascular injury at the skull base. 3. Redemonstration of facial fractures and intracranial hemorrhage as described on earlier head CT. 4. Aortic Atherosclerosis (ICD10-I70.0). Electronically Signed   By: Ulyses Jarred M.D.   On: 04/11/2019 21:50   CT HEAD WO CONTRAST  Result Date: 04/28/2019 CLINICAL DATA:  Head trauma. Subarachnoid hemorrhage. EXAM: CT HEAD WITHOUT CONTRAST TECHNIQUE: Contiguous axial images were obtained from the base of the skull through the vertex without intravenous contrast. COMPARISON:  CT 03/30/2019 FINDINGS: Brain: Hemorrhagic contusion right inferior basal ganglia unchanged. Scattered subarachnoid hemorrhage bilaterally. Subarachnoid blood in the posterior sylvian fissure is more prominent likely due to redistribution of blood. Small amount of intraventricular hemorrhage in the occipital poles. Small interhemispheric subdural hematoma along the falx over the convexity. Pneumocephalus has improved in the interval. Generalized atrophy. No midline shift or hydrocephalus. Mild chronic white matter ischemia. Vascular: Negative for hyperdense vessel Skull: Multiple facial fractures. Left zygomatic arch fracture. Nondisplaced fracture left frontal bone extending down to the orbit. Sinuses/Orbits: Hyperdense blood in the sphenoid and left maxillary sinus. Fractures of the planum sphenoidale likely is the cause of pneumocephalus. This fracture is less apparent on today's study. Other: None IMPRESSION: 1.  Hemorrhagic contusion right inferior basal ganglia unchanged. Scattered subarachnoid and intraventricular hemorrhage unchanged. Small interhemispheric subdural hematoma unchanged. No definite new hemorrhage. Pneumocephalus has improved. 2. Fracture of the planum sphenoidale likely the cause of pneumocephalus Electronically Signed   By: Franchot Gallo M.D.   On: 04-28-19 09:26   CT Head Wo Contrast  Result Date: 03/25/2019 CLINICAL DATA:  Found on ground in garage in pool of blood, last known normal 10 a.m. EXAM: CT HEAD WITHOUT CONTRAST CT MAXILLOFACIAL WITHOUT CONTRAST CT CERVICAL SPINE WITHOUT CONTRAST TECHNIQUE: Multidetector CT imaging of the head, cervical spine, and maxillofacial structures were performed using the standard protocol without intravenous contrast. Multiplanar CT image reconstructions of the cervical spine and maxillofacial structures were also generated. COMPARISON:  CT head 02/08/2019, MR 02/09/2019 FINDINGS: CT HEAD FINDINGS Brain: Parenchymal hemorrhage seen in the mesial right temporal lobe. Hemorrhagic parenchymal contusive changes are noted along the right frontal convexity and right temporal lobe. Additional multifocal sites of subarachnoid and subdural hemorrhage are noted along the falx and tentorium as well as layering within both sylvian fissures with some intraventricular hemorrhage noted in the left lateral ventricle. Additional hemorrhage seen in the quadrigeminal cistern. Scattered pneumocephalus is seen throughout both hemispheres and within the posterior fossa. No midline shift is seen. Findings on a background of chronic microvascular angiopathy and parenchymal volume loss. Vascular: Atherosclerotic calcification of the carotid siphons and intradural vertebral arteries. No hyperdense vessel. Several foci of gas are seen within the cavernous sinuses. Skull: Extensive left frontotemporal scalp swelling and contusion with large crescentic hematoma measuring up to 9 mm in  maximal thickness. Additional swelling extends into the frontal scalp and supraorbital tissues. Other: Patient motion artifact may limit detection of subtle, nondisplaced skull base and calvarial fractures. CT MAXILLOFACIAL FINDINGS Osseous: There is a comminuted fracture of the left pterygoid plates. Complex facial bone fractures include a left LeFort type 1 pattern with fractures through the medial and lateral walls maxillary sinus  extending into the alveolar ridge likely involving the nasal septum. There is additional left LeFort type 3 injury with involving fractures through the medial and lateral walls of the left orbit, left zygomatic arch and left nasofrontal suture. Additional fractures are seen through the posterior nasal septum as well as the through the lateral, posterior and superior walls of the bilateral sphenoid sinuses and sphenoid sinus septum. Fractures extend in close proximity to both the optic and carotid canals likely providing the site of ingress for the intracranial gas detailed above. Additional fracture through the right lateral orbit into the zygomatic process of the right maxilla. Patient is largely edentulous. The mandible appears to be intact temporomandibular joints are normally aligned. Orbits: There is extraconal retroseptal gas and hemorrhage seen in the left orbit. Some thickening of the lateral rectus is noted as it comes in close proximity to the lateral orbital fracture. Small amount of hemorrhage is seen in the lateral floor of the right orbit as well without retro septal gas. The globes appear normal and symmetric. Symmetric appearance of the optic nerve sheath complexes. Normal caliber of the superior ophthalmic veins. Sinuses: There is extensive hyperdense hemosinus with pneumatized secretions in air-fluid levels throughout the left maxillary and sphenoid sinuses as well as near complete opacification of the ethmoids, left greater than right. Mastoid air cells remain well  aerated. Middle ear cavities are clear. There is nonspecific soft tissue occlusion of the left external auditory canal. Ossicular chains appear normally configured. Soft tissues: There is extensive left frontal and periorbital soft tissue swelling extending across the eyelids to the medial nasal bridge and into the may large soft tissues inferiorly. Posteriorly this extends in continuity with the large left frontal scalp contusion and probable laceration with few punctate foci of gas. There is milder right periorbital soft tissue swelling and hematoma. Focal left pre mandibular soft tissue swelling and stranding is noted as well. CT CERVICAL SPINE FINDINGS Alignment: Reversal of the normal cervical lordosis likely on a degenerative basis. No traumatic listhesis or abnormal facet widening is seen. Craniocervical atlantoaxial articulation is maintained. Skull base and vertebrae: Motion artifact may limit detection of subtle, nondisplaced fractures. No definite acute fracture or suspicious osseous lesion of the cervical spine is seen. Soft tissues and spinal canal: No pre or paravertebral fluid or swelling. No visible canal hematoma. Disc levels: Multilevel diffuse intervertebral disc height loss is noted of the cervical spine most pronounced from C5-C7. No severe canal stenosis or foraminal narrowing. Upper chest: Emphysematous changes noted in the lung apices. Other: Cervical carotid atherosclerosis. Diminutive appearance of the thyroid. IMPRESSION: 1. Hemorrhagic parenchymal contusive changes along the right frontal convexity and right temporal lobe. Additional multifocal sites of subarachnoid and subdural hemorrhage are seen along the falx, tentorium, and within the sylvian fissures. Hemorrhage layering within the left lateral ventricle. Additional hemorrhage seen in the quadrigeminal cistern. Convincing features of active or pending herniation at this time. 2. Scattered pneumocephalus is seen throughout both  hemispheres and within the posterior fossa. 3. Complex facial bone fractures as detailed above, including a left LeFort type 1 and type 3 fracture patterns. A right lateral orbital fracture extending into the cycle medic process of the maxilla. 4. Additional fractures of the posterior nasal septum and comminuted fractures of the sphenoid sinuses fractures extend in close proximity to both the optic and carotid canals likely providing the site of ingress for the intracranial gas detailed above. Consider further evaluation with CT angiography of the head and neck to  assess for potential vascular injury. 5. Extensive associated hemosinus. 6. Extensive left frontal scalp, left periorbital and malar soft tissue swelling and contusion with large crescentic hematoma measuring up to 9 mm in maximal thickness. Left pre mandibular soft tissue swelling is noted as well. 7. Second separate site of contusion and hematoma in the right periorbital soft tissues as well. 8. No evidence of acute traumatic injury to the cervical spine though evaluation is somewhat limited by motion artifact. 9. Multilevel degenerative changes of the cervical spine are detailed above. 10. Nonspecific soft tissue occlusion of the left external auditory canal. Correlation with direct visualization is recommended. These results were called by telephone at the time of interpretation on 04/04/2019 at 7:57 pm to provider Dr. Langston Masker, who verbally acknowledged these results. Electronically Signed   By: Lovena Le M.D.   On: 03/22/2019 19:59   CT Angio Neck W and/or Wo Contrast  Result Date: 04/04/2019 CLINICAL DATA:  Fall with posttraumatic headache EXAM: CT ANGIOGRAPHY HEAD AND NECK TECHNIQUE: Multidetector CT imaging of the head and neck was performed using the standard protocol during bolus administration of intravenous contrast. Multiplanar CT image reconstructions and MIPs were obtained to evaluate the vascular anatomy. Carotid stenosis measurements  (when applicable) are obtained utilizing NASCET criteria, using the distal internal carotid diameter as the denominator. CONTRAST:  75m OMNIPAQUE IOHEXOL 350 MG/ML SOLN COMPARISON:  None. Head CT same day FINDINGS: CTA NECK FINDINGS SKELETON: Multiple facial fractures are detailed on the earlier maxillofacial CT. OTHER NECK: Normal pharynx, larynx and major salivary glands. No cervical lymphadenopathy. Unremarkable thyroid gland. UPPER CHEST: No pneumothorax or pleural effusion. No nodules or masses. AORTIC ARCH: There is moderate calcific atherosclerosis of the aortic arch. There is no aneurysm, dissection or hemodynamically significant stenosis of the visualized portion of the aorta. Conventional 3 vessel aortic branching pattern. The visualized proximal subclavian arteries are widely patent. RIGHT CAROTID SYSTEM: Normal without aneurysm, dissection or stenosis. LEFT CAROTID SYSTEM: Normal without aneurysm, dissection or stenosis. VERTEBRAL ARTERIES: Right dominant configuration. Both origins are clearly patent. There is no dissection, occlusion or flow-limiting stenosis to the skull base (V1-V3 segments). CTA HEAD FINDINGS POSTERIOR CIRCULATION: --Vertebral arteries: Normal V4 segments. --Posterior inferior cerebellar arteries (PICA): Patent origins from the vertebral arteries. --Anterior inferior cerebellar arteries (AICA): Patent origins from the basilar artery. --Basilar artery: Normal. --Superior cerebellar arteries: Normal. --Posterior cerebral arteries: Normal. The right PCA is predominantly supplied by the posterior communicating artery. ANTERIOR CIRCULATION: --Intracranial internal carotid arteries: No dissection or occlusion. There is a short segment of dehiscence of the anteromedial right carotid canal at the level of the sphenoid sinus (series 7, image 127). There is mild calcific atherosclerosis without high-grade stenosis. --Anterior cerebral arteries (ACA): Normal. Both A1 segments are present.  Patent anterior communicating artery (a-comm). --Middle cerebral arteries (MCA): Normal. VENOUS SINUSES: As permitted by contrast timing, patent. ANATOMIC VARIANTS: Fetal origin of the right posterior cerebral artery. Review of the MIP images confirms the above findings. IMPRESSION: 1. No intracranial arterial occlusion or high-grade stenosis. 2. No acute blunt cerebrovascular injury at the skull base. 3. Redemonstration of facial fractures and intracranial hemorrhage as described on earlier head CT. 4. Aortic Atherosclerosis (ICD10-I70.0). Electronically Signed   By: KUlyses JarredM.D.   On: 04/18/2019 21:50   CT Cervical Spine Wo Contrast  Result Date: 04/11/2019 CLINICAL DATA:  Found on ground in garage in pool of blood, last known normal 10 a.m. EXAM: CT HEAD WITHOUT CONTRAST CT MAXILLOFACIAL WITHOUT CONTRAST CT CERVICAL  SPINE WITHOUT CONTRAST TECHNIQUE: Multidetector CT imaging of the head, cervical spine, and maxillofacial structures were performed using the standard protocol without intravenous contrast. Multiplanar CT image reconstructions of the cervical spine and maxillofacial structures were also generated. COMPARISON:  CT head 02/08/2019, MR 02/09/2019 FINDINGS: CT HEAD FINDINGS Brain: Parenchymal hemorrhage seen in the mesial right temporal lobe. Hemorrhagic parenchymal contusive changes are noted along the right frontal convexity and right temporal lobe. Additional multifocal sites of subarachnoid and subdural hemorrhage are noted along the falx and tentorium as well as layering within both sylvian fissures with some intraventricular hemorrhage noted in the left lateral ventricle. Additional hemorrhage seen in the quadrigeminal cistern. Scattered pneumocephalus is seen throughout both hemispheres and within the posterior fossa. No midline shift is seen. Findings on a background of chronic microvascular angiopathy and parenchymal volume loss. Vascular: Atherosclerotic calcification of the carotid  siphons and intradural vertebral arteries. No hyperdense vessel. Several foci of gas are seen within the cavernous sinuses. Skull: Extensive left frontotemporal scalp swelling and contusion with large crescentic hematoma measuring up to 9 mm in maximal thickness. Additional swelling extends into the frontal scalp and supraorbital tissues. Other: Patient motion artifact may limit detection of subtle, nondisplaced skull base and calvarial fractures. CT MAXILLOFACIAL FINDINGS Osseous: There is a comminuted fracture of the left pterygoid plates. Complex facial bone fractures include a left LeFort type 1 pattern with fractures through the medial and lateral walls maxillary sinus extending into the alveolar ridge likely involving the nasal septum. There is additional left LeFort type 3 injury with involving fractures through the medial and lateral walls of the left orbit, left zygomatic arch and left nasofrontal suture. Additional fractures are seen through the posterior nasal septum as well as the through the lateral, posterior and superior walls of the bilateral sphenoid sinuses and sphenoid sinus septum. Fractures extend in close proximity to both the optic and carotid canals likely providing the site of ingress for the intracranial gas detailed above. Additional fracture through the right lateral orbit into the zygomatic process of the right maxilla. Patient is largely edentulous. The mandible appears to be intact temporomandibular joints are normally aligned. Orbits: There is extraconal retroseptal gas and hemorrhage seen in the left orbit. Some thickening of the lateral rectus is noted as it comes in close proximity to the lateral orbital fracture. Small amount of hemorrhage is seen in the lateral floor of the right orbit as well without retro septal gas. The globes appear normal and symmetric. Symmetric appearance of the optic nerve sheath complexes. Normal caliber of the superior ophthalmic veins. Sinuses: There  is extensive hyperdense hemosinus with pneumatized secretions in air-fluid levels throughout the left maxillary and sphenoid sinuses as well as near complete opacification of the ethmoids, left greater than right. Mastoid air cells remain well aerated. Middle ear cavities are clear. There is nonspecific soft tissue occlusion of the left external auditory canal. Ossicular chains appear normally configured. Soft tissues: There is extensive left frontal and periorbital soft tissue swelling extending across the eyelids to the medial nasal bridge and into the may large soft tissues inferiorly. Posteriorly this extends in continuity with the large left frontal scalp contusion and probable laceration with few punctate foci of gas. There is milder right periorbital soft tissue swelling and hematoma. Focal left pre mandibular soft tissue swelling and stranding is noted as well. CT CERVICAL SPINE FINDINGS Alignment: Reversal of the normal cervical lordosis likely on a degenerative basis. No traumatic listhesis or abnormal facet widening is  seen. Craniocervical atlantoaxial articulation is maintained. Skull base and vertebrae: Motion artifact may limit detection of subtle, nondisplaced fractures. No definite acute fracture or suspicious osseous lesion of the cervical spine is seen. Soft tissues and spinal canal: No pre or paravertebral fluid or swelling. No visible canal hematoma. Disc levels: Multilevel diffuse intervertebral disc height loss is noted of the cervical spine most pronounced from C5-C7. No severe canal stenosis or foraminal narrowing. Upper chest: Emphysematous changes noted in the lung apices. Other: Cervical carotid atherosclerosis. Diminutive appearance of the thyroid. IMPRESSION: 1. Hemorrhagic parenchymal contusive changes along the right frontal convexity and right temporal lobe. Additional multifocal sites of subarachnoid and subdural hemorrhage are seen along the falx, tentorium, and within the sylvian  fissures. Hemorrhage layering within the left lateral ventricle. Additional hemorrhage seen in the quadrigeminal cistern. Convincing features of active or pending herniation at this time. 2. Scattered pneumocephalus is seen throughout both hemispheres and within the posterior fossa. 3. Complex facial bone fractures as detailed above, including a left LeFort type 1 and type 3 fracture patterns. A right lateral orbital fracture extending into the cycle medic process of the maxilla. 4. Additional fractures of the posterior nasal septum and comminuted fractures of the sphenoid sinuses fractures extend in close proximity to both the optic and carotid canals likely providing the site of ingress for the intracranial gas detailed above. Consider further evaluation with CT angiography of the head and neck to assess for potential vascular injury. 5. Extensive associated hemosinus. 6. Extensive left frontal scalp, left periorbital and malar soft tissue swelling and contusion with large crescentic hematoma measuring up to 9 mm in maximal thickness. Left pre mandibular soft tissue swelling is noted as well. 7. Second separate site of contusion and hematoma in the right periorbital soft tissues as well. 8. No evidence of acute traumatic injury to the cervical spine though evaluation is somewhat limited by motion artifact. 9. Multilevel degenerative changes of the cervical spine are detailed above. 10. Nonspecific soft tissue occlusion of the left external auditory canal. Correlation with direct visualization is recommended. These results were called by telephone at the time of interpretation on 04/07/2019 at 7:57 pm to provider Dr. Langston Masker, who verbally acknowledged these results. Electronically Signed   By: Lovena Le M.D.   On: 04/06/2019 19:59   DG Pelvis Portable  Result Date: 03/23/2019 CLINICAL DATA:  Fall, level 2 trauma EXAM: PORTABLE PELVIS 1-2 VIEWS COMPARISON:  None. FINDINGS: The osseous structures appear  diffusely demineralized which may limit detection of small or nondisplaced fractures. Bones of the pelvis appear intact and congruent. Femoral heads remain normally located. Proximal femora are intact. Extensive degenerative changes noted in the lower lumbar spine and lumbosacral junction. Moderate degenerative changes in the hips. Normal nonobstructive bowel gas pattern. Atherosclerotic calcifications of vascular stenting noted in the low abdomen. Remaining soft tissues are unremarkable. IMPRESSION: 1. No acute fracture or dislocation identified. 2. Diffuse osseous demineralization which may limit detection of small or nondisplaced fractures. 3. Degenerative changes in the lower lumbar spine and lumbosacral junction as well as both hips. Electronically Signed   By: Lovena Le M.D.   On: 04/12/2019 18:33   DG Chest Port 1 View  Result Date: 04/18/2019 CLINICAL DATA:  Fall, level 2 trauma EXAM: PORTABLE CHEST 1 VIEW COMPARISON:  Radiograph 01/19/2019, CTA chest 04/09/2018 FINDINGS: No consolidation, features of edema, pneumothorax, or effusion. The aorta is calcified. The remaining cardiomediastinal contours are unremarkable. Abundant pericardial fat result in increased attenuation in the  left lung base corresponding well to CT findings from 2019. No acute osseous or soft tissue abnormality. Dextrocurvature of the upper thoracic spine is noted. Degenerative changes are present in the imaged spine and shoulders. Remote left lateral third rib fracture. IMPRESSION: 1. No acute cardiopulmonary or traumatic finding in the chest. 2. Abundant pericardial fat resulting in increased attenuation in the left lung base, corresponding to CT findings from 2019. 3.  Aortic Atherosclerosis (ICD10-I70.0). Electronically Signed   By: Lovena Le M.D.   On: 04/19/2019 18:24   MM CLIP PLACEMENT LEFT  Result Date: 03/09/2019 CLINICAL DATA:  Confirmation of clip placement after ultrasound-guided core needle biopsy of 2  separate masses in the OUTER LEFT breast. EXAM: 2D AND TOMOSYNTHESIS DIAGNOSTIC LEFT MAMMOGRAM POST ULTRASOUND BIOPSY COMPARISON:  Previous exam(s). FINDINGS: Tomosynthesis and synthesized full field CC and mediolateral images were obtained following ultrasound guided biopsy of 2 separate masses involving the OUTER LEFT breast. The ribbon shaped tissue marking clip is appropriately position at the site of the biopsied mass in the OUTER LEFT breast (designated # 1 at the near 3 o'clock position approximately 5 cm from the nipple). This particular mass demonstrated skin involvement. The coil shaped tissue marker clip is a appropriately positioned at the site of the biopsied mass in the OUTER LEFT breast (designated # 2 at the near 3 o'clock position approximately 4 cm from the nipple). The clips are approximately 1.4 cm apart. Expected post biopsy changes are present at both sites without evidence of hematoma. IMPRESSION: 1. Appropriate positioning of the ribbon shaped tissue marking clip at the site of the biopsied mass in the OUTER LEFT breast at the near 3 o'clock position 5 cm from the nipple. 2. Appropriate positioning of the coil shaped tissue marking clip at the site of the biopsied mass in the OUTER LEFT breast at the near 3 o'clock position 4 cm from the nipple. 3. The clips are separated by approximately 1.4 cm. Final Assessment: Post Procedure Mammograms for Marker Placement Electronically Signed   By: Evangeline Dakin M.D.   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 This was found to be concordant by Dr. Peggye Fothergill. Pathology results were discussed with the patient's daughter, Suzi Roots by telephone, per request. The patient's  daughter reported her mother did well after the biopsies with tenderness at the sites. Post biopsy instructions and care were reviewed and questions were answered. The patient's daughter was encouraged to call [facility] for any additional concerns. The patient is scheduled to see Dr. Tressa Busman at [facility] on March 19, 2019. Pathology results were called to Isa Rankin at [facility] in [place], [place] on March 12, 2019. Pathology results reported by Terie Purser, RN on 03/12/2019. Electronically Signed   By: Evangeline Dakin M.D.   On: 03/12/2019 13:06   Result Date: 03/12/2019 CLINICAL DATA:  86 year old who underwent malignant lumpectomy of the LEFT breast in 2019. Patient did not wish to perform adjuvant radiation therapy. She discontinued hormonal chemoprevention after approximately 1 month due to side effects. She presented with a palpable lump in the OUTER LEFT breast and was shown on diagnostic workup at [facility] to have  multiple masses in the OUTER LEFT breast. The larger 2 masses are sampled. These masses are approximately 1 cm apart and span in total approximately 2 cm. There are at least 2 smaller masses in between these dominant masses. EXAM: ULTRASOUND GUIDED LEFT BREAST CORE NEEDLE BIOPSY x 2 COMPARISON:  Previous exam(s). FINDINGS: I met with the patient and her daughter and we discussed the procedure of ultrasound-guided biopsy, including benefits and alternatives. We discussed the high likelihood of a successful procedure. We discussed the risks of the procedure, including infection, bleeding, tissue injury, clip migration, and inadequate sampling. Informed written consent was given. The usual time-out protocol was performed immediately prior to the procedure. # 1) Near 3 o'clock location, 5 cm from nipple, lesion quadrant: Slight UPPER OUTER QUADRANT. Initially, using sterile technique with chlorhexidine as skin  antisepsis, 1% Lidocaine as local anesthetic, under direct ultrasound visualization, a 12 gauge Bard Marquee core needle device placed through an 11 gauge introducer needle was used to perform biopsy of the mass at the near 3 o'clock location approximately 5 cm from the nipple with skin involvement using a lateral approach. At the conclusion of the procedure a ribbon shaped tissue marker clip was deployed into the biopsy cavity. # 2) Near 3 o'clock location, 4 cm from nipple, lesion quadrant: Slight UPPER OUTER QUADRANT. Next, using sterile technique with chlorhexidine as skin antisepsis, 1% lidocaine as local anesthetic, under direct ultrasound visualization, a 12 gauge Bard Marquee core needle device placed through an 11 gauge introducer needle was used perform biopsy of the mass at the near 3 o'clock location approximately 4 cm nipple using a lateral approach. I was able to use the same dermatotomy site for both biopsies. At the conclusion of the procedure a coil shaped tissue marker clip was deployed into the biopsy cavity. Follow up 2 view mammogram was performed and dictated separately. IMPRESSION: Ultrasound guided biopsy of 2 separate masses involving the OUTER LEFT breast. No apparent complications. Electronically Signed: By: Evangeline Dakin M.D. On: 03/09/2019 16:47   [place] LT BREAST BX W LOC DEV EA ADD LESION IMG BX SPEC US GUIDE  Addendum Date: 03/12/2019   ADDENDUM REPORT: 03/12/2019 13:06 ADDENDUM: Pathology revealed GRADE II INVASIVE MAMMARY CARCINOMA of the Left breast, both locations, 3 o'clock, 5cm fn with skin involvement and 3 o'clock, 4cm fn. This was found to be concordant by Dr. Peggye Fothergill. Pathology results were discussed with the patient's daughter, Suzi Roots by telephone, per request. The patient's daughter reported her mother did well after the biopsies with tenderness at the sites. Post biopsy instructions and care were reviewed and questions were answered. The patient's  daughter was encouraged to call [facility] for any additional concerns. The patient is scheduled to see Dr. Tressa Busman at [facility] on March 19, 2019. Pathology results were called to Isa Rankin at [facility] in [place], [place] on March 12, 2019. Pathology results reported by Terie Purser, RN on 03/12/2019. Electronically Signed   By: Evangeline Dakin M.D.   On: 03/12/2019 13:06   Result Date: 03/12/2019 CLINICAL DATA:  86 year old who underwent malignant lumpectomy of the LEFT breast in 2019. Patient did not wish to perform adjuvant radiation therapy. She discontinued hormonal chemoprevention after approximately 1 month due to side effects. She presented with a palpable lump in the OUTER LEFT breast and was shown on diagnostic workup at [facility] to have multiple masses in the OUTER LEFT breast. The larger 2  masses are sampled. These masses are approximately 1 cm apart and span in total approximately 2 cm. There are at least 2 smaller masses in between these dominant masses. EXAM: ULTRASOUND GUIDED LEFT BREAST CORE NEEDLE BIOPSY x 2 COMPARISON:  Previous exam(s). FINDINGS: I met with the patient and her daughter and we discussed the procedure of ultrasound-guided biopsy, including benefits and alternatives. We discussed the high likelihood of a successful procedure. We discussed the risks of the procedure, including infection, bleeding, tissue injury, clip migration, and inadequate sampling. Informed written consent was given. The usual time-out protocol was performed immediately prior to the procedure. # 1) Near 3 o'clock location, 5 cm from nipple, lesion quadrant: Slight UPPER OUTER QUADRANT. Initially, using sterile technique with chlorhexidine as skin antisepsis, 1% Lidocaine as local anesthetic, under direct ultrasound visualization, a 12 gauge Bard Marquee core needle device placed through an 11 gauge introducer needle was used to  perform biopsy of the mass at the near 3 o'clock location approximately 5 cm from the nipple with skin involvement using a lateral approach. At the conclusion of the procedure a ribbon shaped tissue marker clip was deployed into the biopsy cavity. # 2) Near 3 o'clock location, 4 cm from nipple, lesion quadrant: Slight UPPER OUTER QUADRANT. Next, using sterile technique with chlorhexidine as skin antisepsis, 1% lidocaine as local anesthetic, under direct ultrasound visualization, a 12 gauge Bard Marquee core needle device placed through an 11 gauge introducer needle was used perform biopsy of the mass at the near 3 o'clock location approximately 4 cm nipple using a lateral approach. I was able to use the same dermatotomy site for both biopsies. At the conclusion of the procedure a coil shaped tissue marker clip was deployed into the biopsy cavity. Follow up 2 view mammogram was performed and dictated separately. IMPRESSION: Ultrasound guided biopsy of 2 separate masses involving the OUTER LEFT breast. No apparent complications. Electronically Signed: By: Evangeline Dakin M.D. On: 03/09/2019 16:47   CT Maxillofacial Wo Contrast  Result Date: 03/21/2019 CLINICAL DATA:  Found on ground in garage in pool of blood, last known normal 10 a.m. EXAM: CT HEAD WITHOUT CONTRAST CT MAXILLOFACIAL WITHOUT CONTRAST CT CERVICAL SPINE WITHOUT CONTRAST TECHNIQUE: Multidetector CT imaging of the head, cervical spine, and maxillofacial structures were performed using the standard protocol without intravenous contrast. Multiplanar CT image reconstructions of the cervical spine and maxillofacial structures were also generated. COMPARISON:  CT head 02/08/2019, MR 02/09/2019 FINDINGS: CT HEAD FINDINGS Brain: Parenchymal hemorrhage seen in the mesial right temporal lobe. Hemorrhagic parenchymal contusive changes are noted along the right frontal convexity and right temporal lobe. Additional multifocal sites of subarachnoid and subdural  hemorrhage are noted along the falx and tentorium as well as layering within both sylvian fissures with some intraventricular hemorrhage noted in the left lateral ventricle. Additional hemorrhage seen in the quadrigeminal cistern. Scattered pneumocephalus is seen throughout both hemispheres and within the posterior fossa. No midline shift is seen. Findings on a background of chronic microvascular angiopathy and parenchymal volume loss. Vascular: Atherosclerotic calcification of the carotid siphons and intradural vertebral arteries. No hyperdense vessel. Several foci of gas are seen within the cavernous sinuses. Skull: Extensive left frontotemporal scalp swelling and contusion with large crescentic hematoma measuring up to 9 mm in maximal thickness. Additional swelling extends into the frontal scalp and supraorbital tissues. Other: Patient motion artifact may limit detection of subtle, nondisplaced skull base and calvarial fractures. CT MAXILLOFACIAL FINDINGS Osseous: There is a comminuted fracture of the left  pterygoid plates. Complex facial bone fractures include a left LeFort type 1 pattern with fractures through the medial and lateral walls maxillary sinus extending into the alveolar ridge likely involving the nasal septum. There is additional left LeFort type 3 injury with involving fractures through the medial and lateral walls of the left orbit, left zygomatic arch and left nasofrontal suture. Additional fractures are seen through the posterior nasal septum as well as the through the lateral, posterior and superior walls of the bilateral sphenoid sinuses and sphenoid sinus septum. Fractures extend in close proximity to both the optic and carotid canals likely providing the site of ingress for the intracranial gas detailed above. Additional fracture through the right lateral orbit into the zygomatic process of the right maxilla. Patient is largely edentulous. The mandible appears to be intact temporomandibular  joints are normally aligned. Orbits: There is extraconal retroseptal gas and hemorrhage seen in the left orbit. Some thickening of the lateral rectus is noted as it comes in close proximity to the lateral orbital fracture. Small amount of hemorrhage is seen in the lateral floor of the right orbit as well without retro septal gas. The globes appear normal and symmetric. Symmetric appearance of the optic nerve sheath complexes. Normal caliber of the superior ophthalmic veins. Sinuses: There is extensive hyperdense hemosinus with pneumatized secretions in air-fluid levels throughout the left maxillary and sphenoid sinuses as well as near complete opacification of the ethmoids, left greater than right. Mastoid air cells remain well aerated. Middle ear cavities are clear. There is nonspecific soft tissue occlusion of the left external auditory canal. Ossicular chains appear normally configured. Soft tissues: There is extensive left frontal and periorbital soft tissue swelling extending across the eyelids to the medial nasal bridge and into the may large soft tissues inferiorly. Posteriorly this extends in continuity with the large left frontal scalp contusion and probable laceration with few punctate foci of gas. There is milder right periorbital soft tissue swelling and hematoma. Focal left pre mandibular soft tissue swelling and stranding is noted as well. CT CERVICAL SPINE FINDINGS Alignment: Reversal of the normal cervical lordosis likely on a degenerative basis. No traumatic listhesis or abnormal facet widening is seen. Craniocervical atlantoaxial articulation is maintained. Skull base and vertebrae: Motion artifact may limit detection of subtle, nondisplaced fractures. No definite acute fracture or suspicious osseous lesion of the cervical spine is seen. Soft tissues and spinal canal: No pre or paravertebral fluid or swelling. No visible canal hematoma. Disc levels: Multilevel diffuse intervertebral disc height  loss is noted of the cervical spine most pronounced from C5-C7. No severe canal stenosis or foraminal narrowing. Upper chest: Emphysematous changes noted in the lung apices. Other: Cervical carotid atherosclerosis. Diminutive appearance of the thyroid. IMPRESSION: 1. Hemorrhagic parenchymal contusive changes along the right frontal convexity and right temporal lobe. Additional multifocal sites of subarachnoid and subdural hemorrhage are seen along the falx, tentorium, and within the sylvian fissures. Hemorrhage layering within the left lateral ventricle. Additional hemorrhage seen in the quadrigeminal cistern. Convincing features of active or pending herniation at this time. 2. Scattered pneumocephalus is seen throughout both hemispheres and within the posterior fossa. 3. Complex facial bone fractures as detailed above, including a left LeFort type 1 and type 3 fracture patterns. A right lateral orbital fracture extending into the cycle medic process of the maxilla. 4. Additional fractures of the posterior nasal septum and comminuted fractures of the sphenoid sinuses fractures extend in close proximity to both the optic and carotid canals likely  providing the site of ingress for the intracranial gas detailed above. Consider further evaluation with CT angiography of the head and neck to assess for potential vascular injury. 5. Extensive associated hemosinus. 6. Extensive left frontal scalp, left periorbital and malar soft tissue swelling and contusion with large crescentic hematoma measuring up to 9 mm in maximal thickness. Left pre mandibular soft tissue swelling is noted as well. 7. Second separate site of contusion and hematoma in the right periorbital soft tissues as well. 8. No evidence of acute traumatic injury to the cervical spine though evaluation is somewhat limited by motion artifact. 9. Multilevel degenerative changes of the cervical spine are detailed above. 10. Nonspecific soft tissue occlusion of the  left external auditory canal. Correlation with direct visualization is recommended. These results were called by telephone at the time of interpretation on 04/14/2019 at 7:57 pm to provider Dr. Langston Masker, who verbally acknowledged these results. Electronically Signed   By: Lovena Le M.D.   On: 04/15/2019 19:59    Microbiology Recent Results (from the past 240 hour(s))  Urine culture     Status: None   Collection Time: 03/30/2019  7:35 PM   Specimen: Urine, Clean Catch  Result Value Ref Range Status   Specimen Description URINE, CLEAN CATCH  Final   Special Requests NONE  Final   Culture   Final    NO GROWTH Performed at [facility], [address] [place], [place] [address]    Report Status 2019-04-10 FINAL  Final    Lab Basic Metabolic Panel: Recent Labs  Lab 04/04/2019 1758 04/03/2019 1806 04/10/2019 0308  NA 140 141 142  K 3.8 3.7 3.6  CL 107 106  --   CO2 18*  --   --   GLUCOSE 183* 180*  --   BUN 18 21  --   CREATININE 0.88 0.80  --   CALCIUM 9.3  --   --    Liver Function Tests: Recent Labs  Lab 04/16/2019 1758  AST 39  ALT 25  ALKPHOS 69  BILITOT 0.7  PROT 6.6  ALBUMIN 3.9   No results for input(s): LIPASE, AMYLASE in the last 168 hours. No results for input(s): AMMONIA in the last 168 hours. CBC: Recent Labs  Lab 04/13/2019 1758 04/09/2019 1806 Apr 10, 2019 0308  WBC 27.6*  27.6*  --   --   NEUTROABS 22.4*  --   --   HGB 13.1  13.2 13.9 10.9*  HCT 41.6  40.7 41.0 32.0*  MCV 103.5*  103.6*  --   --   PLT 200  190  --   --    Cardiac Enzymes: Recent Labs  Lab 04/09/2019 1758  CKTOTAL 220   Sepsis Labs: Recent Labs  Lab 03/22/2019 1757 04/13/2019 1758  WBC  --  27.6*  27.6*  LATICACIDVEN 5.7*  --     Procedures/Operations  none   Zenovia Jarred 03/30/2019, 1:56 PM

## 2019-04-20 NOTE — Progress Notes (Signed)
PT Cancellation Note  Patient Details Name: EILENE MCCRUMMEN MRN: [id number] DOB: May 22, 1929   Cancelled Treatment:    Reason Eval/Treat Not Completed: Patient not medically ready - Pt in moderate respiratory distress per chart review, and per RN pt not appropriate to see from medical standpoint today. PT to check back.   [place] Pager [phone]  Office [phone]    Roxine Caddy D Elonda Husky 19-Apr-2019, 11:33 AM

## 2019-04-20 NOTE — Progress Notes (Signed)
95 cc of morphine wasted in sink with Amy S. RN. Lianne Bushy RN BSN.

## 2019-04-20 NOTE — Progress Notes (Signed)
Patient ID: [name], female   DOB: 01-Jun-1929, 86 y.o.   MRN: [id number] Sats upper 62s. Family wants to transition to comfort care. This is appropriate. Georganna Skeans, MD, MPH, FACS Please use [email or web address] to contact on call provider

## 2019-04-20 NOTE — Progress Notes (Signed)
Patient ID: [name], female   DOB: 06/30/1929, 86 y.o.   MRN: [id number] I met with her daughter and two sons. They are considering transition to comfort care. I will F/U with them this PM again. Georganna Skeans, MD, MPH, FACS Please use [email or web address] to contact on call provider

## 2019-04-20 DEATH — deceased

## 2019-08-02 ENCOUNTER — Ambulatory Visit: Payer: Medicare HMO | Admitting: Oncology

## 2019-08-02 ENCOUNTER — Other Ambulatory Visit: Payer: Medicare HMO

## 2019-08-30 ENCOUNTER — Other Ambulatory Visit: Payer: Medicare HMO

## 2019-08-30 ENCOUNTER — Ambulatory Visit: Payer: Medicare HMO | Admitting: Oncology

## 2021-09-11 IMAGING — MR MR HEAD W/O CM
11 of 13 series · 36 of 48 positions shown · non-contrast
Comparison: Which head CT from yesterday

CLINICAL DATA: Confusion with slurred speech

EXAM:
MRI HEAD WITHOUT CONTRAST
TECHNIQUE: Multiplanar, multiecho pulse sequences of the brain and surrounding
structures were obtained without intravenous contrast.

[Series 2: DWI · axial · 3.0mm · 0.86mm/px · z∈[-47,+100]mm · 5 of 100 slices shown (1 of 4)]
[im 1/100]
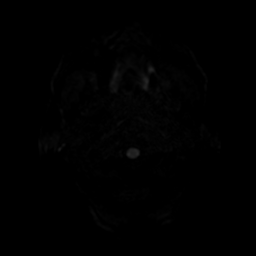
[im 25/100]
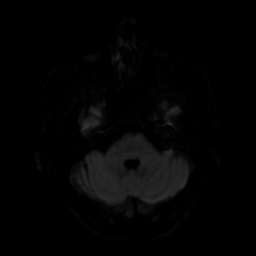
[im 50/100]
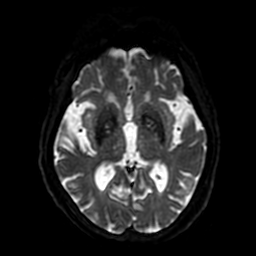
[im 75/100]
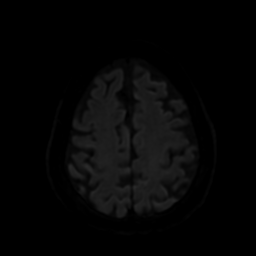
[im 100/100]
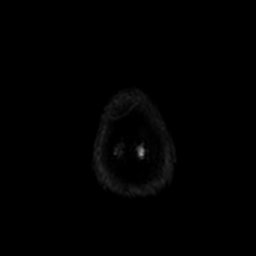

[Series 3: DWI · coronal · 4.0mm · 0.94mm/px · 4 of 72 slices shown (2 of 4)]
[im 1/72]
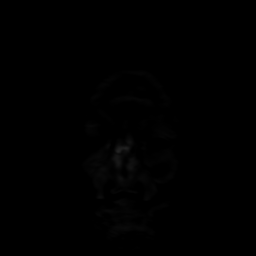
[im 24/72]
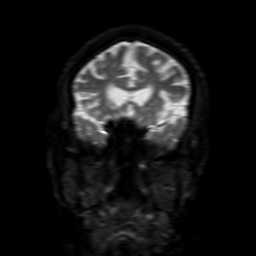
[im 48/72]
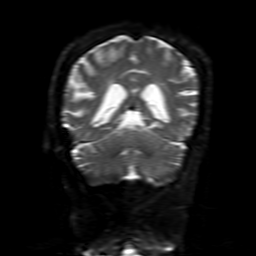
[im 72/72]
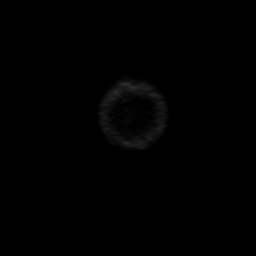

[Series 4: FLAIR · sagittal · 5.0mm · 0.47mm/px · 1 of 25 slices shown (1 of 2)]
[im 1/25]
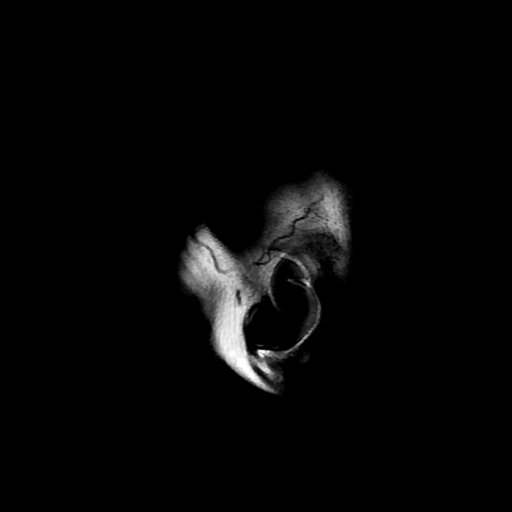

[Series 5: T2 · axial · 5.0mm · 0.43mm/px · z∈[-59,+109]mm · 2 of 29 slices shown (1 of 2)]
[im 1/29]
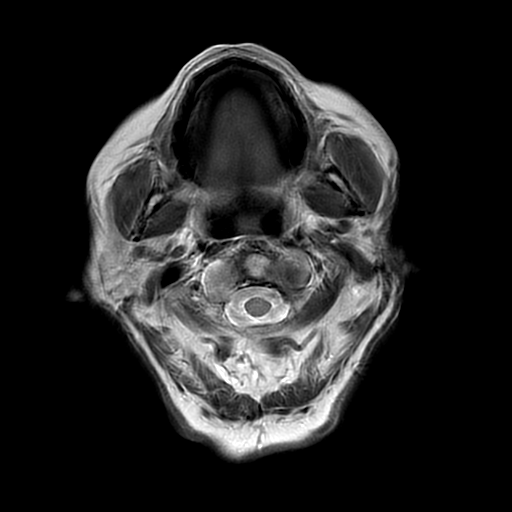
[im 29/29]
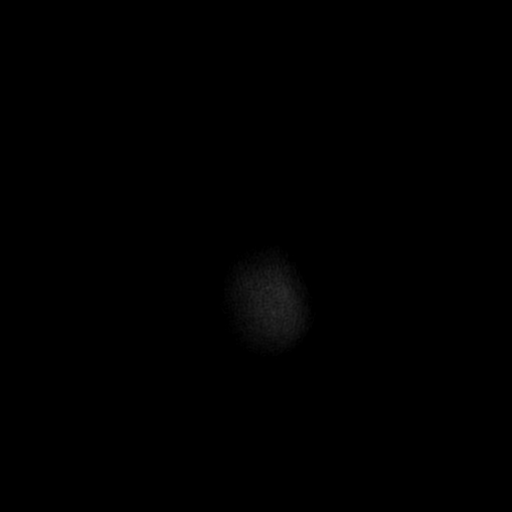

[Series 6: FLAIR · axial · 3.0mm · 0.47mm/px · z∈[-59,+109]mm · 2 of 29 slices shown (2 of 2)]
[im 1/29]
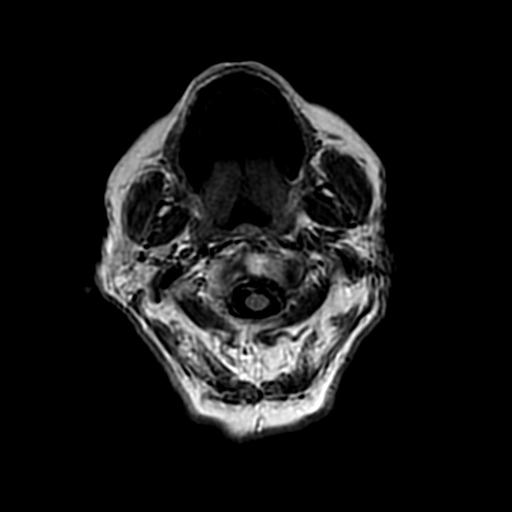
[im 29/29]
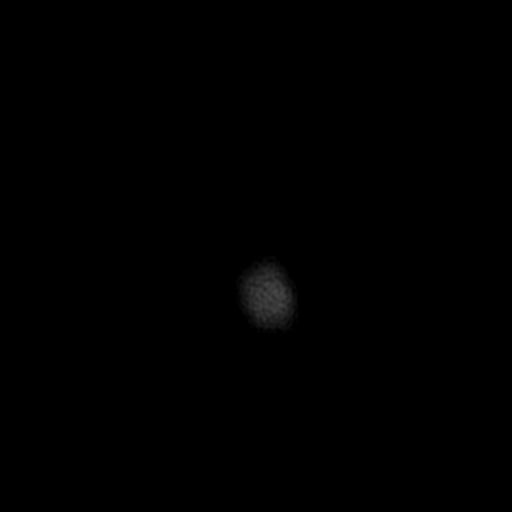

[Series 7: SWI · axial · 3.0mm · 0.47mm/px · z∈[-61,+53]mm · 5 of 116 slices shown]
[im 1/116]
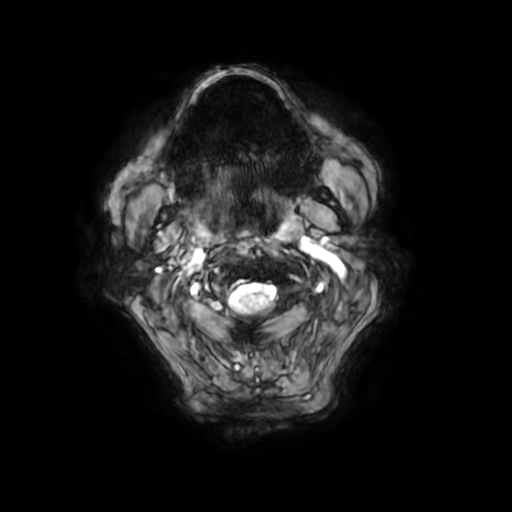
[im 20/116]
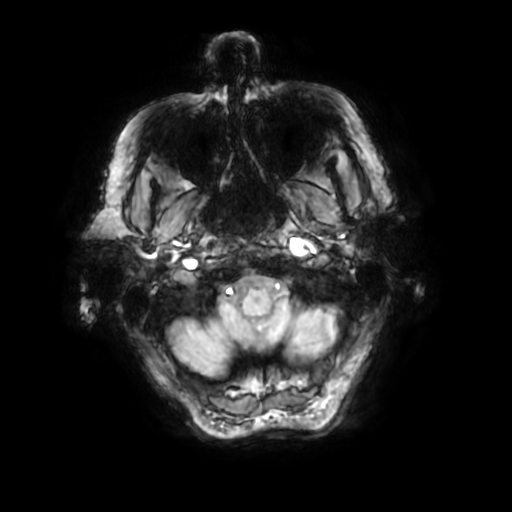
[im 39/116]
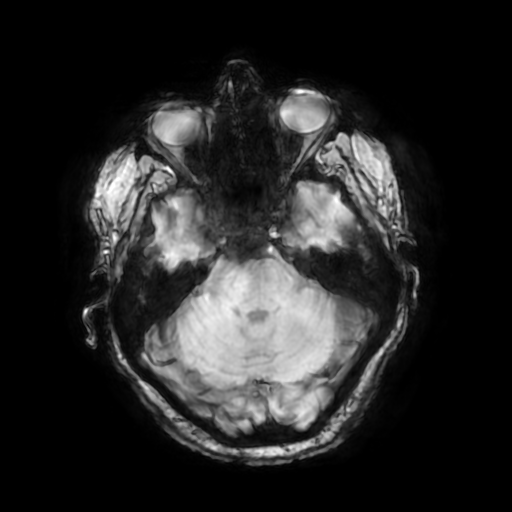
[im 58/116]
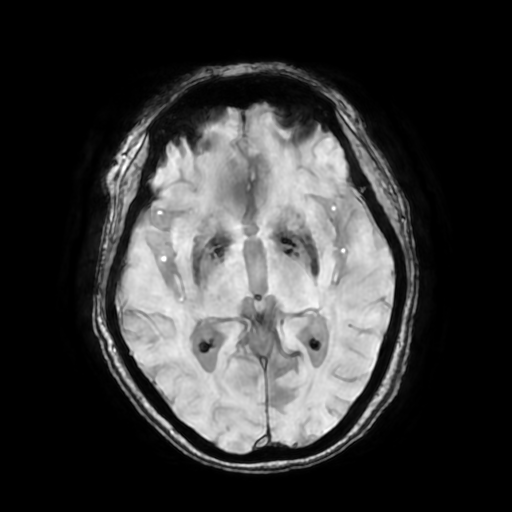
[im 77/116]
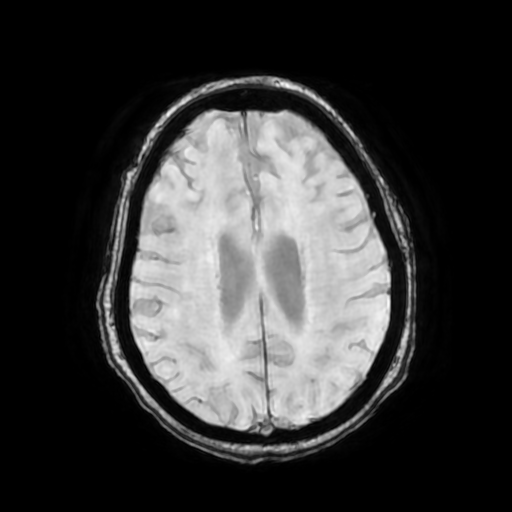

[Series 9: T2 · coronal · 5.0mm · 0.39mm/px · 2 of 31 slices shown (2 of 2)]
[im 1/31]
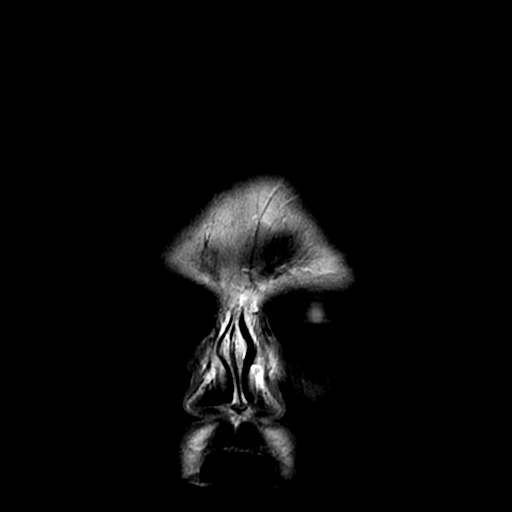
[im 31/31]
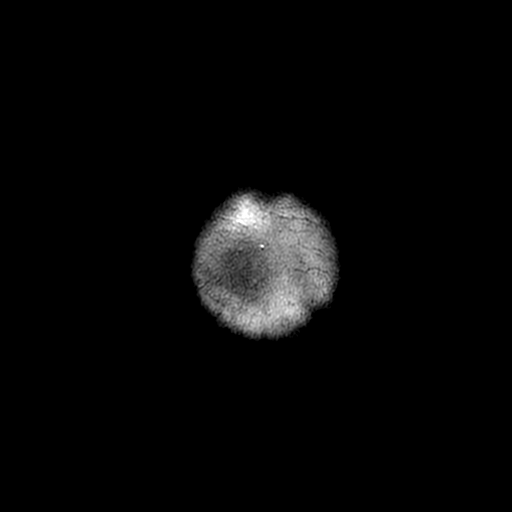

[Series 210: DWI · axial · 3.0mm · 0.86mm/px · z∈[-47,+100]mm · 6 of 100 slices shown (3 of 4)]
[im 1/100]
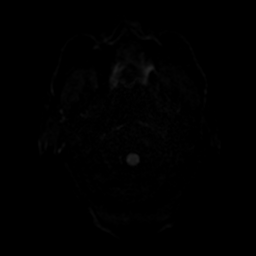
[im 20/100]
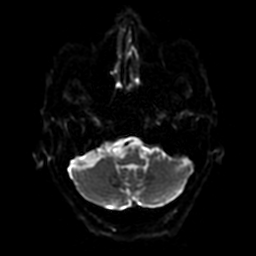
[im 40/100]
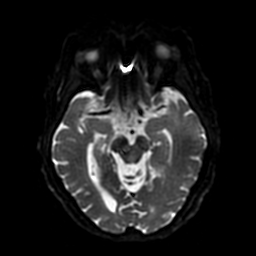
[im 60/100]
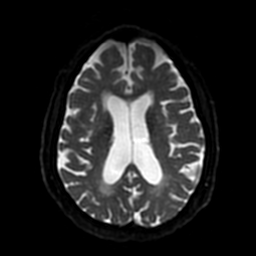
[im 80/100]
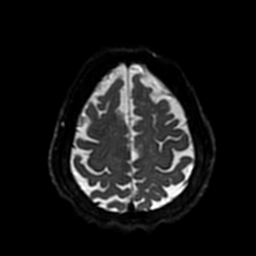
[im 100/100]
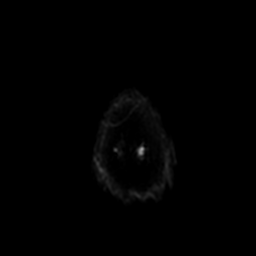

[Series 250: ADC · axial · 3.0mm · 0.86mm/px · z∈[-47,+100]mm · 3 of 50 slices shown (1 of 2)]
[im 1/50]
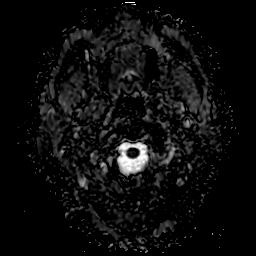
[im 25/50]
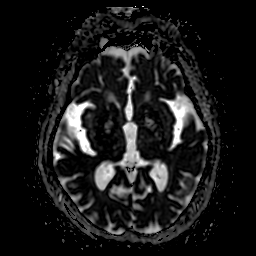
[im 50/50]
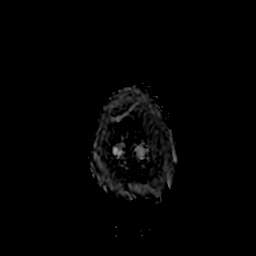

[Series 310: DWI · coronal · 4.0mm · 0.94mm/px · 4 of 72 slices shown (4 of 4)]
[im 1/72]
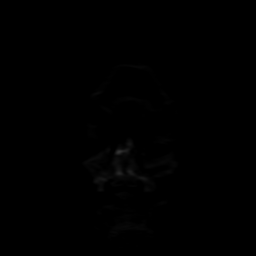
[im 24/72]
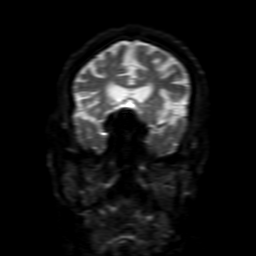
[im 48/72]
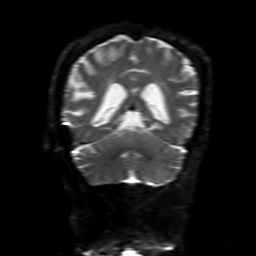
[im 72/72]
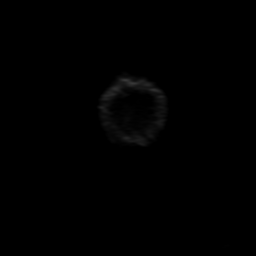

[Series 350: ADC · coronal · 4.0mm · 0.94mm/px · 2 of 36 slices shown (2 of 2)]
[im 1/36]
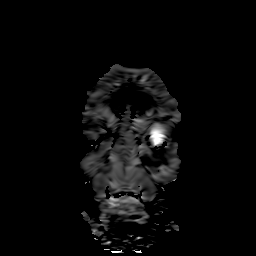
[im 36/36]
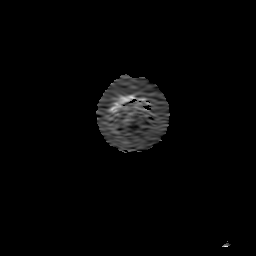

[36 of 48 positions shown; findings below may reference images not displayed]

FINDINGS: Brain: No acute infarction, hemorrhage, hydrocephalus, extra-axial
collection or mass lesion. Small remote left cerebellar infarct.
Mild for age chronic small vessel ischemic change in the
periventricular white matter. Mild for age cerebral volume loss.
Dilated perivascular spaces, larger on the right below the putamen.

Vascular: Normal flow voids

Skull and upper cervical spine: Nodes negative for marrow lesion

Sinuses/Orbits: Bilateral cataract resection. Partial right mastoid
opacification with negative nasopharynx.
IMPRESSION: Aging brain without acute or reversible finding.
